# Patient Record
Sex: Female | Born: 1960
Health system: Southern US, Community
[De-identification: ages and names within clinical notes are randomized; demographics above are authoritative.]

## PROBLEM LIST (undated history)

## (undated) DIAGNOSIS — A048 Other specified bacterial intestinal infections: Secondary | ICD-10-CM

## (undated) DIAGNOSIS — F419 Anxiety disorder, unspecified: Secondary | ICD-10-CM

## (undated) DIAGNOSIS — M1990 Unspecified osteoarthritis, unspecified site: Secondary | ICD-10-CM

## (undated) HISTORY — DX: Anxiety disorder, unspecified: F41.9

## (undated) HISTORY — PX: CATARACT EXTRACTION: SUR2

## (undated) HISTORY — DX: Unspecified osteoarthritis, unspecified site: M19.90

## (undated) HISTORY — PX: TUBAL LIGATION: SHX77

## (undated) HISTORY — PX: UPPER GASTROINTESTINAL ENDOSCOPY: SHX188

## (undated) HISTORY — DX: Other specified bacterial intestinal infections: A04.8

## (undated) HISTORY — PX: CERVICAL FUSION: SHX112

---

## 1999-01-23 ENCOUNTER — Ambulatory Visit (HOSPITAL_COMMUNITY): Admission: RE | Admit: 1999-01-23 | Discharge: 1999-01-23 | Payer: Self-pay | Admitting: Gastroenterology

## 1999-11-03 HISTORY — PX: VAGINAL HYSTERECTOMY: SUR661

## 2000-02-16 ENCOUNTER — Ambulatory Visit (HOSPITAL_COMMUNITY): Admission: RE | Admit: 2000-02-16 | Discharge: 2000-02-16 | Payer: Self-pay | Admitting: Family Medicine

## 2000-02-16 ENCOUNTER — Encounter: Payer: Self-pay | Admitting: Family Medicine

## 2000-02-16 ENCOUNTER — Inpatient Hospital Stay (HOSPITAL_COMMUNITY): Admission: AD | Admit: 2000-02-16 | Discharge: 2000-02-16 | Payer: Self-pay | Admitting: *Deleted

## 2000-02-25 ENCOUNTER — Ambulatory Visit (HOSPITAL_COMMUNITY): Admission: RE | Admit: 2000-02-25 | Discharge: 2000-02-25 | Payer: Self-pay | Admitting: *Deleted

## 2000-04-05 ENCOUNTER — Encounter (INDEPENDENT_AMBULATORY_CARE_PROVIDER_SITE_OTHER): Payer: Self-pay

## 2000-04-05 ENCOUNTER — Inpatient Hospital Stay (HOSPITAL_COMMUNITY): Admission: RE | Admit: 2000-04-05 | Discharge: 2000-04-07 | Payer: Self-pay | Admitting: *Deleted

## 2001-09-03 ENCOUNTER — Encounter: Payer: Self-pay | Admitting: Neurological Surgery

## 2001-09-03 ENCOUNTER — Ambulatory Visit (HOSPITAL_COMMUNITY): Admission: RE | Admit: 2001-09-03 | Discharge: 2001-09-03 | Payer: Self-pay | Admitting: Neurological Surgery

## 2001-09-26 ENCOUNTER — Encounter: Payer: Self-pay | Admitting: Neurological Surgery

## 2001-09-26 ENCOUNTER — Ambulatory Visit (HOSPITAL_COMMUNITY): Admission: RE | Admit: 2001-09-26 | Discharge: 2001-09-26 | Payer: Self-pay | Admitting: Neurological Surgery

## 2001-11-18 ENCOUNTER — Encounter: Payer: Self-pay | Admitting: Neurological Surgery

## 2001-11-22 ENCOUNTER — Inpatient Hospital Stay (HOSPITAL_COMMUNITY): Admission: RE | Admit: 2001-11-22 | Discharge: 2001-11-23 | Payer: Self-pay | Admitting: Neurological Surgery

## 2001-11-22 ENCOUNTER — Encounter: Payer: Self-pay | Admitting: Neurological Surgery

## 2001-12-12 ENCOUNTER — Inpatient Hospital Stay (HOSPITAL_COMMUNITY): Admission: EM | Admit: 2001-12-12 | Discharge: 2001-12-19 | Payer: Self-pay | Admitting: Psychiatry

## 2001-12-23 ENCOUNTER — Other Ambulatory Visit (HOSPITAL_COMMUNITY): Admission: RE | Admit: 2001-12-23 | Discharge: 2002-01-02 | Payer: Self-pay | Admitting: *Deleted

## 2002-01-03 ENCOUNTER — Inpatient Hospital Stay (HOSPITAL_COMMUNITY): Admission: EM | Admit: 2002-01-03 | Discharge: 2002-01-06 | Payer: Self-pay | Admitting: Psychiatry

## 2002-01-05 ENCOUNTER — Encounter (HOSPITAL_COMMUNITY): Payer: Self-pay | Admitting: Psychiatry

## 2002-01-05 ENCOUNTER — Ambulatory Visit (HOSPITAL_COMMUNITY): Admission: RE | Admit: 2002-01-05 | Discharge: 2002-01-05 | Payer: Self-pay | Admitting: Psychiatry

## 2002-01-08 ENCOUNTER — Inpatient Hospital Stay (HOSPITAL_COMMUNITY): Admission: EM | Admit: 2002-01-08 | Discharge: 2002-01-11 | Payer: Self-pay | Admitting: Psychiatry

## 2002-01-11 ENCOUNTER — Other Ambulatory Visit (HOSPITAL_COMMUNITY): Admission: RE | Admit: 2002-01-11 | Discharge: 2002-01-19 | Payer: Self-pay | Admitting: *Deleted

## 2002-04-10 ENCOUNTER — Inpatient Hospital Stay (HOSPITAL_COMMUNITY): Admission: EM | Admit: 2002-04-10 | Discharge: 2002-04-11 | Payer: Self-pay | Admitting: Emergency Medicine

## 2002-04-10 ENCOUNTER — Encounter: Payer: Self-pay | Admitting: Emergency Medicine

## 2002-04-11 ENCOUNTER — Encounter: Payer: Self-pay | Admitting: *Deleted

## 2003-12-10 ENCOUNTER — Other Ambulatory Visit: Admission: RE | Admit: 2003-12-10 | Discharge: 2003-12-10 | Payer: Self-pay | Admitting: Internal Medicine

## 2004-01-16 ENCOUNTER — Encounter: Admission: RE | Admit: 2004-01-16 | Discharge: 2004-01-16 | Payer: Self-pay | Admitting: Internal Medicine

## 2004-05-26 ENCOUNTER — Emergency Department (HOSPITAL_COMMUNITY): Admission: EM | Admit: 2004-05-26 | Discharge: 2004-05-26 | Payer: Self-pay | Admitting: Emergency Medicine

## 2004-09-05 ENCOUNTER — Ambulatory Visit: Payer: Self-pay | Admitting: Licensed Clinical Social Worker

## 2004-09-12 ENCOUNTER — Ambulatory Visit: Payer: Self-pay | Admitting: Licensed Clinical Social Worker

## 2004-09-19 ENCOUNTER — Ambulatory Visit: Payer: Self-pay | Admitting: Licensed Clinical Social Worker

## 2004-09-26 ENCOUNTER — Ambulatory Visit: Payer: Self-pay | Admitting: Licensed Clinical Social Worker

## 2004-09-30 ENCOUNTER — Encounter: Admission: RE | Admit: 2004-09-30 | Discharge: 2004-09-30 | Payer: Self-pay | Admitting: Internal Medicine

## 2004-10-08 ENCOUNTER — Ambulatory Visit: Payer: Self-pay | Admitting: Licensed Clinical Social Worker

## 2006-06-26 ENCOUNTER — Emergency Department (HOSPITAL_COMMUNITY): Admission: EM | Admit: 2006-06-26 | Discharge: 2006-06-26 | Payer: Self-pay | Admitting: Family Medicine

## 2006-07-29 ENCOUNTER — Ambulatory Visit: Payer: Self-pay | Admitting: Licensed Clinical Social Worker

## 2006-08-12 ENCOUNTER — Ambulatory Visit: Payer: Self-pay | Admitting: Licensed Clinical Social Worker

## 2006-08-26 ENCOUNTER — Ambulatory Visit: Payer: Self-pay | Admitting: Licensed Clinical Social Worker

## 2006-09-01 ENCOUNTER — Encounter: Admission: RE | Admit: 2006-09-01 | Discharge: 2006-09-01 | Payer: Self-pay | Admitting: Internal Medicine

## 2006-09-09 ENCOUNTER — Ambulatory Visit: Payer: Self-pay | Admitting: Licensed Clinical Social Worker

## 2006-09-30 ENCOUNTER — Other Ambulatory Visit: Admission: RE | Admit: 2006-09-30 | Discharge: 2006-09-30 | Payer: Self-pay | Admitting: Internal Medicine

## 2006-10-07 ENCOUNTER — Ambulatory Visit: Payer: Self-pay | Admitting: Licensed Clinical Social Worker

## 2006-10-21 ENCOUNTER — Ambulatory Visit: Payer: Self-pay | Admitting: Licensed Clinical Social Worker

## 2006-11-18 ENCOUNTER — Ambulatory Visit: Payer: Self-pay | Admitting: Licensed Clinical Social Worker

## 2006-12-23 ENCOUNTER — Ambulatory Visit: Payer: Self-pay | Admitting: Licensed Clinical Social Worker

## 2007-01-13 ENCOUNTER — Ambulatory Visit: Payer: Self-pay | Admitting: Licensed Clinical Social Worker

## 2007-02-02 ENCOUNTER — Emergency Department (HOSPITAL_COMMUNITY): Admission: EM | Admit: 2007-02-02 | Discharge: 2007-02-02 | Payer: Self-pay | Admitting: Family Medicine

## 2007-02-03 ENCOUNTER — Ambulatory Visit: Payer: Self-pay | Admitting: Licensed Clinical Social Worker

## 2007-02-17 ENCOUNTER — Ambulatory Visit: Payer: Self-pay | Admitting: Licensed Clinical Social Worker

## 2007-03-10 ENCOUNTER — Ambulatory Visit: Payer: Self-pay | Admitting: Licensed Clinical Social Worker

## 2007-03-31 ENCOUNTER — Ambulatory Visit: Payer: Self-pay | Admitting: Licensed Clinical Social Worker

## 2007-04-28 ENCOUNTER — Ambulatory Visit: Payer: Self-pay | Admitting: Licensed Clinical Social Worker

## 2007-05-19 ENCOUNTER — Ambulatory Visit: Payer: Self-pay | Admitting: Licensed Clinical Social Worker

## 2007-06-09 ENCOUNTER — Ambulatory Visit: Payer: Self-pay | Admitting: Licensed Clinical Social Worker

## 2007-06-30 ENCOUNTER — Ambulatory Visit: Payer: Self-pay | Admitting: Licensed Clinical Social Worker

## 2007-07-28 ENCOUNTER — Ambulatory Visit: Payer: Self-pay | Admitting: Licensed Clinical Social Worker

## 2007-11-17 ENCOUNTER — Ambulatory Visit: Payer: Self-pay | Admitting: Licensed Clinical Social Worker

## 2007-12-15 ENCOUNTER — Ambulatory Visit: Payer: Self-pay | Admitting: Licensed Clinical Social Worker

## 2008-01-05 ENCOUNTER — Ambulatory Visit: Payer: Self-pay | Admitting: Licensed Clinical Social Worker

## 2008-01-26 ENCOUNTER — Ambulatory Visit: Payer: Self-pay | Admitting: Licensed Clinical Social Worker

## 2008-02-23 ENCOUNTER — Ambulatory Visit: Payer: Self-pay | Admitting: Licensed Clinical Social Worker

## 2008-04-19 ENCOUNTER — Ambulatory Visit: Payer: Self-pay | Admitting: Licensed Clinical Social Worker

## 2008-07-10 ENCOUNTER — Ambulatory Visit: Payer: Self-pay | Admitting: Licensed Clinical Social Worker

## 2008-07-24 ENCOUNTER — Ambulatory Visit: Payer: Self-pay | Admitting: Licensed Clinical Social Worker

## 2008-08-23 ENCOUNTER — Ambulatory Visit: Payer: Self-pay | Admitting: Licensed Clinical Social Worker

## 2008-09-11 ENCOUNTER — Ambulatory Visit: Payer: Self-pay | Admitting: Internal Medicine

## 2008-10-19 ENCOUNTER — Ambulatory Visit: Payer: Self-pay | Admitting: Licensed Clinical Social Worker

## 2008-11-12 ENCOUNTER — Ambulatory Visit: Payer: Self-pay | Admitting: Internal Medicine

## 2008-11-20 ENCOUNTER — Ambulatory Visit: Payer: Self-pay | Admitting: Internal Medicine

## 2008-12-03 ENCOUNTER — Ambulatory Visit: Payer: Self-pay | Admitting: Licensed Clinical Social Worker

## 2008-12-13 ENCOUNTER — Ambulatory Visit: Payer: Self-pay | Admitting: Licensed Clinical Social Worker

## 2009-01-10 ENCOUNTER — Ambulatory Visit: Payer: Self-pay | Admitting: Licensed Clinical Social Worker

## 2009-02-07 ENCOUNTER — Ambulatory Visit: Payer: Self-pay | Admitting: Licensed Clinical Social Worker

## 2009-02-26 ENCOUNTER — Ambulatory Visit: Payer: Self-pay | Admitting: Internal Medicine

## 2009-03-07 ENCOUNTER — Ambulatory Visit: Payer: Self-pay | Admitting: Licensed Clinical Social Worker

## 2009-04-18 ENCOUNTER — Ambulatory Visit: Payer: Self-pay | Admitting: Licensed Clinical Social Worker

## 2009-06-13 ENCOUNTER — Ambulatory Visit: Payer: Self-pay | Admitting: Licensed Clinical Social Worker

## 2009-07-22 ENCOUNTER — Emergency Department (HOSPITAL_COMMUNITY): Admission: EM | Admit: 2009-07-22 | Discharge: 2009-07-22 | Payer: Self-pay | Admitting: Emergency Medicine

## 2009-07-23 ENCOUNTER — Ambulatory Visit: Payer: Self-pay | Admitting: Internal Medicine

## 2009-07-25 ENCOUNTER — Ambulatory Visit: Payer: Self-pay | Admitting: Licensed Clinical Social Worker

## 2009-08-09 ENCOUNTER — Ambulatory Visit: Payer: Self-pay | Admitting: Internal Medicine

## 2009-09-05 ENCOUNTER — Ambulatory Visit: Payer: Self-pay | Admitting: Licensed Clinical Social Worker

## 2009-10-01 ENCOUNTER — Ambulatory Visit: Payer: Self-pay | Admitting: Internal Medicine

## 2010-02-25 ENCOUNTER — Emergency Department (HOSPITAL_COMMUNITY): Admission: EM | Admit: 2010-02-25 | Discharge: 2010-02-25 | Payer: Self-pay | Admitting: Family Medicine

## 2010-03-07 ENCOUNTER — Ambulatory Visit: Payer: Self-pay | Admitting: Internal Medicine

## 2010-03-07 ENCOUNTER — Encounter (INDEPENDENT_AMBULATORY_CARE_PROVIDER_SITE_OTHER): Payer: Self-pay | Admitting: *Deleted

## 2010-03-07 ENCOUNTER — Encounter: Admission: RE | Admit: 2010-03-07 | Discharge: 2010-03-07 | Payer: Self-pay | Admitting: Internal Medicine

## 2010-03-18 ENCOUNTER — Encounter: Payer: Self-pay | Admitting: Gastroenterology

## 2010-04-02 ENCOUNTER — Encounter: Payer: Self-pay | Admitting: Gastroenterology

## 2010-04-08 ENCOUNTER — Other Ambulatory Visit: Admission: RE | Admit: 2010-04-08 | Discharge: 2010-04-08 | Payer: Self-pay | Admitting: Gynecology

## 2010-04-08 ENCOUNTER — Ambulatory Visit: Payer: Self-pay | Admitting: Gynecology

## 2010-04-16 ENCOUNTER — Ambulatory Visit: Payer: Self-pay | Admitting: Gynecology

## 2010-05-01 ENCOUNTER — Telehealth (INDEPENDENT_AMBULATORY_CARE_PROVIDER_SITE_OTHER): Payer: Self-pay | Admitting: *Deleted

## 2010-05-07 ENCOUNTER — Telehealth (INDEPENDENT_AMBULATORY_CARE_PROVIDER_SITE_OTHER): Payer: Self-pay | Admitting: *Deleted

## 2010-06-13 ENCOUNTER — Ambulatory Visit: Payer: Self-pay | Admitting: Gastroenterology

## 2010-06-13 ENCOUNTER — Encounter (INDEPENDENT_AMBULATORY_CARE_PROVIDER_SITE_OTHER): Payer: Self-pay | Admitting: *Deleted

## 2010-06-13 DIAGNOSIS — R1084 Generalized abdominal pain: Secondary | ICD-10-CM | POA: Insufficient documentation

## 2010-06-13 LAB — CONVERTED CEMR LAB
AST: 15 units/L (ref 0–37)
BUN: 13 mg/dL (ref 6–23)
Basophils Relative: 0.4 % (ref 0.0–3.0)
Calcium: 9.3 mg/dL (ref 8.4–10.5)
Chloride: 113 meq/L — ABNORMAL HIGH (ref 96–112)
Creatinine, Ser: 0.7 mg/dL (ref 0.4–1.2)
Eosinophils Absolute: 0.1 10*3/uL (ref 0.0–0.7)
Eosinophils Relative: 1.7 % (ref 0.0–5.0)
GFR calc non Af Amer: 96.35 mL/min (ref >60)
Glucose, Bld: 80 mg/dL (ref 70–99)
Hemoglobin: 12.5 g/dL (ref 12.0–15.0)
IgA: 305 mg/dL (ref 68–378)
Lymphocytes Relative: 43.6 % (ref 12.0–46.0)
MCHC: 34.9 g/dL (ref 30.0–36.0)
MCV: 91.9 fL (ref 78.0–100.0)
Neutro Abs: 2.4 10*3/uL (ref 1.4–7.7)
RBC: 3.91 M/uL (ref 3.87–5.11)
Tissue Transglutaminase Ab, IgA: 6.6 units (ref <20)

## 2010-06-23 ENCOUNTER — Telehealth: Payer: Self-pay | Admitting: Gastroenterology

## 2010-06-24 ENCOUNTER — Telehealth: Payer: Self-pay | Admitting: Gastroenterology

## 2010-06-27 ENCOUNTER — Ambulatory Visit: Payer: Self-pay | Admitting: Gynecology

## 2010-07-03 DIAGNOSIS — A048 Other specified bacterial intestinal infections: Secondary | ICD-10-CM

## 2010-07-03 HISTORY — DX: Other specified bacterial intestinal infections: A04.8

## 2010-07-08 ENCOUNTER — Ambulatory Visit: Payer: Self-pay | Admitting: Licensed Clinical Social Worker

## 2010-07-08 ENCOUNTER — Ambulatory Visit: Payer: Self-pay | Admitting: Gastroenterology

## 2010-07-18 ENCOUNTER — Telehealth: Payer: Self-pay | Admitting: Gastroenterology

## 2010-08-05 ENCOUNTER — Ambulatory Visit: Payer: Self-pay | Admitting: Licensed Clinical Social Worker

## 2010-09-01 ENCOUNTER — Encounter: Admission: RE | Admit: 2010-09-01 | Discharge: 2010-09-01 | Payer: Self-pay | Admitting: Internal Medicine

## 2010-09-02 ENCOUNTER — Telehealth: Payer: Self-pay | Admitting: Gastroenterology

## 2010-09-02 ENCOUNTER — Ambulatory Visit: Payer: Self-pay | Admitting: Licensed Clinical Social Worker

## 2010-09-04 ENCOUNTER — Telehealth: Payer: Self-pay | Admitting: Gastroenterology

## 2010-09-04 ENCOUNTER — Ambulatory Visit: Payer: Self-pay | Admitting: Internal Medicine

## 2010-09-04 DIAGNOSIS — A09 Infectious gastroenteritis and colitis, unspecified: Secondary | ICD-10-CM | POA: Insufficient documentation

## 2010-09-04 DIAGNOSIS — R1012 Left upper quadrant pain: Secondary | ICD-10-CM | POA: Insufficient documentation

## 2010-09-04 DIAGNOSIS — A048 Other specified bacterial intestinal infections: Secondary | ICD-10-CM | POA: Insufficient documentation

## 2010-09-04 DIAGNOSIS — R1032 Left lower quadrant pain: Secondary | ICD-10-CM | POA: Insufficient documentation

## 2010-09-11 ENCOUNTER — Ambulatory Visit (HOSPITAL_COMMUNITY): Admission: RE | Admit: 2010-09-11 | Discharge: 2010-09-11 | Payer: Self-pay | Admitting: Internal Medicine

## 2010-09-15 ENCOUNTER — Telehealth: Payer: Self-pay | Admitting: Internal Medicine

## 2010-09-30 ENCOUNTER — Ambulatory Visit: Payer: Self-pay | Admitting: Internal Medicine

## 2010-10-09 ENCOUNTER — Ambulatory Visit: Payer: Self-pay | Admitting: Internal Medicine

## 2010-10-09 ENCOUNTER — Ambulatory Visit: Payer: Self-pay | Admitting: Licensed Clinical Social Worker

## 2010-10-17 ENCOUNTER — Ambulatory Visit: Payer: Self-pay | Admitting: Cardiology

## 2010-10-23 ENCOUNTER — Ambulatory Visit: Payer: Self-pay | Admitting: Licensed Clinical Social Worker

## 2010-11-20 ENCOUNTER — Ambulatory Visit
Admission: RE | Admit: 2010-11-20 | Discharge: 2010-11-20 | Payer: Self-pay | Source: Home / Self Care | Attending: Licensed Clinical Social Worker | Admitting: Licensed Clinical Social Worker

## 2010-11-22 ENCOUNTER — Encounter: Payer: Self-pay | Admitting: Internal Medicine

## 2010-11-25 ENCOUNTER — Ambulatory Visit
Admission: RE | Admit: 2010-11-25 | Discharge: 2010-11-25 | Payer: Self-pay | Source: Home / Self Care | Attending: Internal Medicine | Admitting: Internal Medicine

## 2010-12-02 NOTE — Progress Notes (Signed)
Summary: Triage  Phone Note Call from Patient Call back at Home Phone [phone]   Caller: Patient Call For: Dr. [name] Reason for Call: Talk to Nurse Summary of Call: Since taking the Pylera she doesn't have an appetite with abd pain Initial call taken by: Karna Christmas,  July 18, 2010 3:41 PM  Follow-up for Phone Call        Pt states that since she was seen in the office she has developed right side abdominal pain and loss of appetite.  The pain is no worse.  She was put on Pylera and feels bloated.  I advised her to watch it over the weekend that it sometimes takes several weeks for the symptoms of H pylori to subside  and if her pain worsens to go to the ER and call  on Monday if no better.  Pt agreed Follow-up by: Chales Abrahams CMA Duncan Dull),  July 18, 2010 4:21 PM  Additional Follow-up for Phone Call Additional follow up Details #1::        i agree Additional Follow-up by: [name] Fee MD,  July 21, 2010 8:19 AM

## 2010-12-02 NOTE — Letter (Signed)
Summary: EGD Instructions  [facility]  [address] [place], [place] [address]   Phone: [phone]  Fax: [phone]       KHIA DIETERICH    1961-07-22    MRN: [id number]       Procedure Day /Date:07/14/10 MON     Arrival Time: 3 pm     Procedure Time:4 pm     Location of Procedure:                    [facility] (4th Floor)    PREPARATION FOR ENDOSCOPY   On 07/14/10  THE DAY OF THE PROCEDURE:  1.   No solid foods, milk or milk products are allowed after midnight the night before your procedure.  2.   Do not drink anything colored red or purple.  Avoid juices with pulp.  No orange juice.   3.  You may drink clear liquids until 2 pm, which is 2 hours before your procedure.                                                                                                CLEAR LIQUIDS INCLUDE: Water Jello Ice Popsicles Tea (sugar ok, no milk/cream) Powdered fruit flavored drinks Coffee (sugar ok, no milk/cream) Gatorade Juice: apple, white grape, white cranberry  Lemonade Clear bullion, consomm, broth Carbonated beverages (any kind) Strained chicken noodle soup Hard Candy   MEDICATION INSTRUCTIONS  Unless otherwise instructed, you should take regular prescription medications with a small sip of water as early as possible the morning of your procedure.              OTHER INSTRUCTIONS  You will need a responsible adult at least 50 years of age to accompany you and drive you home.   This person must remain in the waiting room during your procedure.  Wear loose fitting clothing that is easily removed.  Leave jewelry and other valuables at home.  However, you may wish to bring a book to read or an iPod/MP3 player to listen to music as you wait for your procedure to start.  Remove all body piercing jewelry and leave at home.  Total time from sign-in until discharge is approximately 2-3 hours.  You should go home directly  after your procedure and rest.  You can resume normal activities the day after your procedure.  The day of your procedure you should not:   Drive   Make legal decisions   Operate machinery   Drink alcohol   Return to work  You will receive specific instructions about eating, activities and medications before you leave.    The above instructions have been reviewed and explained to me by   _______________________    I fully understand and can verbalize these instructions _____________________________ Date _________

## 2010-12-02 NOTE — Progress Notes (Signed)
Summary: Speak to nurse  Phone Note Call from Patient   Caller: [phone] Onalee Hua -spouse Call For: DR [name] Reason for Call: Talk to Nurse Summary of Call: Wants to discuss pain problem with nurse Initial call taken by: Leanor Kail [facility],  June 24, 2010 11:29 AM  Follow-up for Phone Call        I advised the spouse that I am unable to discuss the pt with him.  The pt himself will need to call Follow-up by: Chales Abrahams CMA Duncan Dull),  June 24, 2010 11:38 AM

## 2010-12-02 NOTE — Assessment & Plan Note (Signed)
History of Present Illness Visit Type: new patient  Primary GI MD: Rob Bunting MD Primary Provider: [name], MD  Requesting Provider: na Chief Complaint: Generalized abd pain, ? ulcers   History of Present Illness:     [name] pleasant 50 year old [name] who was sent by [facility].    4 months ago she had sharp left flank pain that radiated to whole stomach and even back.  went to urgent care. Urinalysis was normal. CT eventually done, and it was normal.  Went to see Dr. Loreta Ave, found Heme + stool.  Eventual colonoscopy to [facility] and it was normal.  Told to have repeat EGD  Was positive for H. pylori serologically, was put on prev-pac.  This was 2 months ago.  A pelvic ultrasound was normal (by gynecologist).  She has been coughing up material that is black.  She has to sleep with a pillow or she hurts on left side or right side. Somedays aren't as bad as others.    Her appetite has changed.  She has a lot of nausea.  Bloating, lack of appetite.  Has lost about 5 pounds.  [name] poor appetite, when she forces herself to eat  it hurt. She has no pyrosis at all.  NO dysphagia at all.  GEneralized abd pain that is intermittent.  Worse with certain positions, can occur after eating.  NO NSAIDs at all.            Current Medications (verified): 1)  Xanax 0.5 Mg Tabs (Alprazolam) .... One Tablet By Mouth Three Times A Day 2)  Topamax 200 Mg Tabs (Topiramate) .Marland Kitchen.. 1 and 1/2 By Mouth Once Daily 3)  Multivitamins   Tabs (Multiple Vitamin) .... One Tablet By Mouth Once Daily 4)  Lamictal 100 Mg Tabs (Lamotrigine) .... One Tablet By Mouth Once Daily 5)  Atenolol 50 Mg Tabs (Atenolol) .... One Tablet By Mouth Once Daily 6)  Ambien 10 Mg Tabs (Zolpidem Tartrate) .... One Tablet By Mouth Once Daily At Bedtime  Allergies (verified): No Known Drug Allergies  Past History:  Past Medical History: Chronic Headaches Urinary Tract Infection Depression BIPOLAR DISORDER Anxiety Disorder     Past Surgical History: Hysterectomy PARTIAL CERVICAL LAMINECTOMY   Family History: no colon cancer  Social History: she is divorced, she has 3 children, she works as a Advertising copywriter, she does not smoke cigarettes, she does not drink alcohol or caffeine  Review of Systems       Pertinent positive and negative review of systems were noted in the above HPI and GI specific review of systems.  All other review of systems was otherwise negative.   Vital Signs:  Patient profile:   50 year old female Height:      60 inches Weight:      121 pounds BMI:     23.72 BSA:     1.51 Pulse rate:   60 / minute Pulse rhythm:   regular BP sitting:   110 / 64  (left arm) Cuff size:   regular  Vitals Entered By: Ok Anis CMA (June 13, 2010 9:52 AM)  Physical Exam  Additional Exam:  Constitutional: generally well appearing Psychiatric: alert and oriented times 3 Eyes: extraocular movements intact Mouth: oropharynx moist, no lesions Neck: supple, no lymphadenopathy Cardiovascular: heart regular rate and rythm Lungs: CTA bilaterally Abdomen: soft, non-tender, non-distended, no obvious ascites, no peritoneal signs, normal bowel sounds Extremities: no lower extremity edema bilaterally Skin: no lesions on visible extremities    Impression &  Recommendations:  Problem # 1:  Intermittent abdominal pains her pains are positional, sometimes related to eating. A recent colonoscopy by another gastroenterologist was completely normal to the terminal ileum. CT Scan recently was completely normal. She will get a repeat set of labs including a CBC, complete metabolic profile, celiac sprue testing. We'll proceed with EGD at her soonest convenience check her for significant gastritis, peptic ulcer disease.   Other Orders: TLB-CBC Platelet - w/Differential (85025-CBCD) TLB-CMP (Comprehensive Metabolic Pnl) (80053-COMP) T-Tissue Transglutamase Ab IgA (40981-19147) TLB-IgA (Immunoglobulin A)  (82784-IGA)  Patient Instructions: 1)  You will be scheduled to have an upper endoscopy. 2)  You will get lab test(s) done today (cbc, cmet, tTG, total IgA). 3)  A copy of this information will be sent to Dr. Katherina Right, Lenord Fellers. 4)  The medication list was reviewed and reconciled.  All changed / newly prescribed medications were explained.  A complete medication list was provided to the patient / caregiver.  Appended Document: Orders Update/EGD    Clinical Lists Changes  Orders: Added new Test order of EGD (EGD) - Signed

## 2010-12-02 NOTE — Progress Notes (Signed)
Summary: results request  Phone Note Call from Patient Call back at Home Phone [phone]   Caller: Patient Call For: Dr. Juanda Chance Reason for Call: Talk to Nurse Summary of Call: would like test results Initial call taken by: Vallarie Mare,  September 15, 2010 1:08 PM  Follow-up for Phone Call        Patient has been advised that her u/s and rib films were normal. She continues to have left sided abdominal pain as she was when seen in office. She would like to know your suggestions for the next step in your care..... Follow-up by: Lamona Curl CMA Duncan Dull),  September 15, 2010 1:13 PM  Additional Follow-up for Phone Call Additional follow up Details #1::        SHE NEEDS TO SEE DR. BAXLEY,HER PAIN IS MUSCULOSKELETAL-NOT GI IN NATURE. Additional Follow-up by: Peterson Ao,  September 16, 2010 4:35 PM     Appended Document: results request Patient advised to see Dr Lenord Fellers for evaluation of pain as it is more likely musculoskeletal related than GI related. Patient verbalizes understanding.

## 2010-12-02 NOTE — Progress Notes (Signed)
Summary: Triage  Phone Note Call from Patient Call back at Home Phone [phone]   Caller: Patient Call For: Dr. Gerilyn Pilgrim Reason for Call: Talk to Nurse Summary of Call: Still having left side abd pain under ribs and radiating to the back....requesting to see P.A. Initial call taken by: Karna Christmas,  September 04, 2010 12:18 PM  Follow-up for Phone Call        pt continues to have left side abd pain and wishes to see PA today, Dr [name] ok'd visit per last triage note.  She was put on Amy's schedule for 2 pm Follow-up by: Chales Abrahams CMA Duncan Dull),  September 04, 2010 12:27 PM

## 2010-12-02 NOTE — Progress Notes (Signed)
Summary: RECORDS REVIEW  Phone Note Other Incoming   Request: Send information Summary of Call: [facility] medical records release form received from the patient requesting records from Dr. Kenna Gilbert office. Release faxed 04/29/2010 to [phone].      Appended Document:  Records received from Dr. Kenna Gilbert office today. 5 pages forwarded to Dr. [name] for review.

## 2010-12-02 NOTE — Letter (Signed)
Summary: [facility]  [facility]   Imported By: Sherian Rein 06/26/2010 13:53:14  _____________________________________________________________________  External Attachment:    Type:   Image     Comment:   External Document

## 2010-12-02 NOTE — Progress Notes (Signed)
Summary: Triage  Phone Note Call from Patient Call back at Home Phone [phone]   Caller: Patient Call For: Dr. [name] Reason for Call: Talk to Nurse Summary of Call: pt. has ENDO sch'd for 07-08-10 and having a flareup. Abd pain, nauseated, vomiting. Prescribed Vicodin from her PCP and it is not helping. Wants to know if something can be prescribed? Initial call taken by: Karna Christmas,  June 23, 2010 2:18 PM  Follow-up for Phone Call        pt having left side abd pain, bloating, no constipation, diarrhea.  No fever.  No rectal bleeding.  Feels like"contractions".  Has to lay on her side with a pillow. . Pt had a feeling of fainting this morning   Pt wants to have something for the pain. 07/08/10 is having EGD. Follow-up by: Chales Abrahams CMA Duncan Dull),  June 23, 2010 3:02 PM  Additional Follow-up for Phone Call Additional follow up Details #1::        I would not prescribe narcotic pain meds at this point, she can try tramadol 50mg  pills, disp 60, she can take one pill three times a day as needed for pains.  Her CT scan was normal, labs all normal.   Additional Follow-up by: [name] Fee MD,  June 23, 2010 3:08 PM    Additional Follow-up for Phone Call Additional follow up Details #2::    rx sent pt aware Follow-up by: Chales Abrahams CMA Duncan Dull),  June 23, 2010 3:12 PM  New/Updated Medications: TRAMADOL HCL 50 MG TABS (TRAMADOL HCL) 1 by mouth three times a day Prescriptions: TRAMADOL HCL 50 MG TABS (TRAMADOL HCL) 1 by mouth three times a day  #60 x 0   Entered by:   Chales Abrahams CMA (AAMA)   Authorized by:   [name] Fee MD   Signed by:   Chales Abrahams CMA (AAMA) on 06/23/2010   Method used:   Electronically to        [facility]  [phone]* (retail)       [address] [place], [place]  [address]       Ph: [phone] or [phone]       Fax: [phone]   RxID:   [id number]

## 2010-12-02 NOTE — Progress Notes (Signed)
Summary: [name] [name] abd pain & back pain  Phone Note Call from Patient Call back at Home Phone [phone]   Call For: Dr [name] Reason for Call: Talk to Nurse Summary of Call: Doesnt think she can hold out until her appoinment on 09-19-10. All her symptoms are back. Sharp abd pain & back pain. Initial call taken by: Leanor Kail [facility],  September 02, 2010 2:28 PM  Follow-up for Phone Call        pt having left side abd pain under ribs and radiating to the back.  Pylera was finished and the pain got better, appetite came back.  Then 2 weeks later the pain returned and she lost her appetite.  The pain is the same as the pain before the Pylera.  Saturday the pain returned worse and she felt like she may need to go to the ER.  She had nausea and vomiting and had to go to bed.  Lying down is the only thing that makes it some what better.  Pt was in tears over the phone.  Pt thinks she may need additional antibiotics and something for nausea.  Please advise Follow-up by: Chales Abrahams CMA Duncan Dull),  September 02, 2010 2:52 PM  Additional Follow-up for Phone Call Additional follow up Details #1::        would not retrieved with antibiotics however Phenergan is reasonable. Please prescribed 25 mg pills, take one to 2 pills every 5-6 hours as needed for nausea. Dispense 50 with one refill Additional Follow-up by: [name] Fee MD,  September 02, 2010 4:16 PM    Additional Follow-up for Phone Call Additional follow up Details #2::    pt has phenergan at home for migraines and would still like to be evaluated before the end of the year because of her deductible.  Can she be scheduled for a extender appt or should she see her PCP?   She thinks someone told her the pain could be her spleen. Chales Abrahams CMA Duncan Dull)  September 02, 2010 4:31 PM   Additional Follow-up for Phone Call Additional follow up Details #3:: Details for Additional Follow-up Action Taken: 11/18 is before the end of the year, should be OK for  her deductable issue.  If she is unable to wait because of symptoms, then a PA spot is OK or any opening with me.   Additional Follow-up by: [name] Fee MD,  September 02, 2010 7:06 PM  The pt wishes to keep her 09/19/10 appt with Dr [name], she will call back if she has any worse pain or symptoms before then. Chales Abrahams CMA Duncan Dull)  September 03, 2010 8:46 AM

## 2010-12-02 NOTE — Procedures (Signed)
Summary: Colonoscopy/[facility]  Colonoscopy/[facility]   Imported By: Sherian Rein 06/26/2010 13:54:17  _____________________________________________________________________  External Attachment:    Type:   Image     Comment:   External Document

## 2010-12-02 NOTE — Progress Notes (Signed)
Summary: Schedule New appt  Phone Note Outgoing Call Call back at [facility] Phone [phone]   Call placed by: Chales Abrahams CMA Duncan Dull),  May 07, 2010 1:28 PM Summary of Call: called to offer pt a New pt appt Initial call taken by: Chales Abrahams CMA Duncan Dull),  May 07, 2010 1:29 PM  Follow-up for Phone Call        pt called and appt was made Follow-up by: Chales Abrahams CMA Duncan Dull),  May 07, 2010 1:59 PM

## 2010-12-02 NOTE — Procedures (Signed)
Summary: Upper Endoscopy  Patient: [name] [name] Note: All result statuses are Final unless otherwise noted.  Tests: (1) Upper Endoscopy (EGD)   EGD Upper Endoscopy       DONE     [facility]     [address]     [place], [place]  [address]           ENDOSCOPY PROCEDURE REPORT           PATIENT:  [name], [name]  MR#:  [id number]     BIRTHDATE:  Jul 30, 1961, 49 yrs. old  GENDER:  female     ENDOSCOPIST:  [name] Fee, MD     Referred by:  Sharlet Salina, M.D.     PROCEDURE DATE:  07/08/2010     PROCEDURE:  EGD with biopsy     ASA CLASS:  Class II     INDICATIONS:  LUQ pains     MEDICATIONS:  Fentanyl 50 mcg IV, Versed 4 mg IV     TOPICAL ANESTHETIC:  Exactacain Spray           DESCRIPTION OF PROCEDURE:   After the risks benefits and     alternatives of the procedure were thoroughly explained, informed     consent was obtained.  The LB [id number]-[id number] endoscope was     introduced through the mouth and advanced to the second portion of     the duodenum, without limitations.  The instrument was slowly     withdrawn as the mucosa was fully examined.     <<PROCEDUREIMAGES>>     There was moderate appearing, atrophic pan-gastritis. Biopsies     were taken to check for H. pylori and sent to pathology (jar 1)     (see image7 and image8).  Otherwise the examination was normal     (see image1, image2, image3, and image4).    Retroflexed views     revealed no abnormalities.    The scope was then withdrawn from     the patient and the procedure completed.     COMPLICATIONS:  None           ENDOSCOPIC IMPRESSION:     1) Moderate gastritis, biopsied to check for H. pylori     2) Otherwise normal examination           RECOMMENDATIONS:     If biopsies show H. pylori, she will be started on the     appropriate antibiotics.  If not, she will be referred back to     PCP. The predominantly left upper flank pains may not be GI     related given workup to date  (EGD, CT scan, colonoscopy by Dr.     Loreta Ave, labs).     ______________________________     [name] Fee, MD           cc: Dr. Audie Box           n.     eSIGNED:   [name] Fee at 07/08/2010 11:10 AM           Aldean Baker, [id number]  Note: An exclamation mark (!) indicates a result that was not dispersed into the flowsheet. Document Creation Date: 07/08/2010 11:10 AM _______________________________________________________________________  (1) Order result status: Final Collection or observation date-time: 07/08/2010 10:59 Requested date-time:  Receipt date-time:  Reported date-time:  Referring Physician:   Ordering Physician: Rob Bunting [phone]) Specimen Source:  Source: Launa Grill Order Number: [id number] Lab site:

## 2010-12-02 NOTE — Assessment & Plan Note (Signed)
Summary: left side abd pain/pl                        [name] [name]   History of Present Illness Visit Type: Follow-up Visit Primary GI MD: Rob Bunting MD Primary Provider: [name], MD  Requesting Provider: na Chief Complaint: LUQ pain that radiates to back and chest History of Present Illness:   50 YO FEMALE KNOW RECENTLY TO DR. Christella [name] WHO WAS SEEN IN AUGUST  WITH C/O LUQ PAIN. SHE UNDERWENT EGD  WHICH SHOWED GASTRITIS/+ FOR H.PYLORI ON BX. SHE WAS GIVEN A COURSE OF PYLERA /OMEPRAZOLE X 10 DAYS  SHE SAYS SHE FELT BAD WHILE ON THE ABX WITH NAUSEA,ALTERED TASTE ETC. ABOUT 2 WEEKS AFTER FINISHING THE ABX SHE STARTED TO FEEL BETTER,IMPROVED APPETITE ETC. ABOUT 2 WEEKS AGO SHE STARTED NOTICING LUQ PAIN BACK AGAIN,INTERMITTENT. THIS PAST WEEKEND THE PAIN BECAME INTENSE AND "BALLED HER UP". SHE DESCRIBES A CONSATNT SQEEZING,PRESSURE TYPE PAIN IN THE LUQ,LOWER LEFT RIBS AND AROUND INTO LEFT BACK. SHE IS SLEEPING WITH PILLOWS PROPPING UP HER BACK, AND HURTS WORSE WITH MOVEMENT,STRETCHING ETC. NO FEVER,APPETITE FAIR, NO CHANGES IN BOWELS ETC. SHE ADAMANTLY DENIES ANY INJURY ETC.  SHE DOES HAVE HX OF LUMBAR AND CERVICAL DISC DISEASE.SHE IS WORRIED ABOUT HER SPLEEN.   GI Review of Systems    Reports abdominal pain, chest pain, loss of appetite, and  nausea.     Location of  Abdominal pain: LUQ.    Denies acid reflux, belching, bloating, dysphagia with liquids, dysphagia with solids, heartburn, vomiting, vomiting blood, weight loss, and  weight gain.      Reports change in bowel habits.     Denies anal fissure, black tarry stools, constipation, diarrhea, diverticulosis, fecal incontinence, heme positive stool, hemorrhoids, irritable bowel syndrome, jaundice, light color stool, liver problems, rectal bleeding, and  rectal pain.    Current Medications (verified): 1)  Xanax 0.5 Mg Tabs (Alprazolam) .... One Tablet By Mouth Three Times A Day 2)  Topamax 200 Mg Tabs (Topiramate) .Marland Kitchen.. 1 and 1/2 By Mouth  Once Daily 3)  Multivitamins   Tabs (Multiple Vitamin) .... One Tablet By Mouth Once Daily 4)  Lamictal 100 Mg Tabs (Lamotrigine) .... One Tablet By Mouth Once Daily 5)  Atenolol 50 Mg Tabs (Atenolol) .... One Tablet By Mouth Once Daily 6)  Ambien 10 Mg Tabs (Zolpidem Tartrate) .... One Tablet By Mouth Once Daily At Bedtime 7)  Vivelle-Dot 0.1 Mg/24hr Pttw (Estradiol) .... As Directed  Allergies (verified): No Known Drug Allergies  Past History:  Past Medical History: Chronic Headaches/MIGRAINES Urinary Tract Infection Depression BIPOLAR DISORDER Anxiety Disorder    Past Surgical History: Reviewed history from 06/13/2010 and no changes required. Hysterectomy PARTIAL CERVICAL LAMINECTOMY   Family History: Reviewed history from 06/13/2010 and no changes required. no colon cancer  Social History: Reviewed history from 06/13/2010 and no changes required. she is divorced, she has 3 children, she works as a Advertising copywriter, she does not smoke cigarettes, she does not drink alcohol or caffeine  Review of Systems       The patient complains of fatigue and headaches-new.  The patient denies allergy/sinus, anemia, anxiety-new, arthritis/joint pain, back pain, blood in urine, breast changes/lumps, change in vision, confusion, cough, coughing up blood, depression-new, fainting, fever, hearing problems, heart murmur, heart rhythm changes, itching, menstrual pain, muscle pains/cramps, night sweats, nosebleeds, pregnancy symptoms, shortness of breath, skin rash, sleeping problems, sore throat, swelling of feet/legs, swollen lymph glands, thirst -  excessive , urination - excessive , urination changes/pain, urine leakage, vision changes, and voice change.         SEE HPI  Vital Signs:  Patient profile:   50 year old female Height:      60 inches Weight:      123.50 pounds BMI:     24.21 Pulse rate:   68 / minute Pulse rhythm:   regular BP sitting:   98 / 66  (left arm) Cuff size:    regular  Vitals Entered By: June McMurray CMA Duncan Dull) (September 04, 2010 2:10 PM)  Physical Exam  General:  Well developed, well nourished, no acute distress. Head:  Normocephalic and atraumatic. Eyes:  PERRLA, no icterus. Lungs:  Clear throughout to auscultation. Heart:  Regular rate and rhythm; no murmurs, rubs,  or bruits. Abdomen:  SOFT, NO MASS OR HSM, BS+,TENDER LUQ AND ALONG LEFT COSTAL MARGIN, AND ALONG LEFT LOWER RIBS IN TO LEFT BACK. Rectal:  NOT DONE Extremities:  No clubbing, cyanosis, edema or deformities noted. Neurologic:  Alert and  oriented x4;  grossly normal neurologically. Psych:  Alert and cooperative. Normal mood and affect.   Impression & Recommendations:  Problem # 1:  ABDOMINAL PAIN-LUQ (ICD-789.02) Assessment Deteriorated 50 YO FEMALE WITH   RECURRENT LUQ PAIN WHICH BY EXAM IS MUSCULOSKELETAL . R/O COSTOCHONDRITIS.  UPPER ABDOMINAL US LEFT LOWER RIB DETAILS TRIAL OF NORFLEX 100 MG TWICE DAILY X 10 DAYS TRIAL OF NAPROXEN 500 MG TWICE DAILY X 10 DAYS  IF SXS PERSIST [name] ADVISED TO FOLLOW UP WITH DR. BAXLEY , AS SXS  ARE NOT GI IN NATURE.  Problem # 2:  HELICOBACTER PYLORI INFECTION (ICD-041.86) Assessment: Comment Only TREATED WITH PYLERA 9/11  Other Orders: Ultrasound Abdomen (UAS)  Patient Instructions: 1)  We scheduled the  the Ultrasound for 09-11-10, Thursday, 2)  Directions and brochure provided. 3)  We sent the Norflex and Naproxen prescriptions to  [facility]. 4)  Copy sent to : Candis Shine, Md 5)  The medication list was reviewed and reconciled.  All changed / newly prescribed medications were explained.  A complete medication list was provided to the patient / caregiver. Prescriptions: NAPROXEN SODIUM 550 MG TABS (NAPROXEN SODIUM) take 1 tab every 12 hours as needed for pain x 10 days  #20 x 0   Entered by:   Lowry Ram NCMA   Authorized by:   Sammuel Cooper PA-c   Signed by:   Lowry Ram NCMA on 09/04/2010   Method used:    Electronically to        [phone]* (retail)       [address] [place], [place]  [address]       Ph: [phone] or [phone]       Fax: [phone]   RxID:   [id number] ORPHENADRINE CITRATE CR 100 MG XR12H-TAB (ORPHENADRINE CITRATE) Take 1 tab twice daily x 10 days  #20 x 0   Entered by:   Lowry Ram NCMA   Authorized by:   Sammuel Cooper PA-c   Signed by:   Lowry Ram NCMA on 09/04/2010   Method used:   Electronically to        [facility]  [phone]* (retail)       [address] [place], [place]  [address]       Ph: [phone] or [phone]  Fax: [phone]   RxID:   [id number] CIPRO 500 MG TABS (CIPROFLOXACIN HCL) Take 1 tab twice daily x 7 days  #14 x 0   Entered by:   Lowry Ram NCMA   Authorized by:   Sammuel Cooper PA-c   Signed by:   Lowry Ram NCMA on 09/04/2010   Method used:   Print then Give to Patient   RxID:   [id number]  This Cipro prescription was done in error.

## 2010-12-04 ENCOUNTER — Ambulatory Visit: Payer: Self-pay | Admitting: Licensed Clinical Social Worker

## 2010-12-23 ENCOUNTER — Ambulatory Visit (INDEPENDENT_AMBULATORY_CARE_PROVIDER_SITE_OTHER): Payer: No Typology Code available for payment source | Admitting: Licensed Clinical Social Worker

## 2010-12-23 DIAGNOSIS — F3189 Other bipolar disorder: Secondary | ICD-10-CM

## 2011-01-06 ENCOUNTER — Ambulatory Visit (INDEPENDENT_AMBULATORY_CARE_PROVIDER_SITE_OTHER): Payer: No Typology Code available for payment source | Admitting: Licensed Clinical Social Worker

## 2011-01-06 DIAGNOSIS — F3189 Other bipolar disorder: Secondary | ICD-10-CM

## 2011-01-20 LAB — POCT URINALYSIS DIP (DEVICE)
Bilirubin Urine: NEGATIVE
Glucose, UA: NEGATIVE mg/dL
Glucose, UA: NEGATIVE mg/dL
Nitrite: NEGATIVE
Nitrite: NEGATIVE
Protein, ur: NEGATIVE mg/dL
Specific Gravity, Urine: 1.01 (ref 1.005–1.030)
Urobilinogen, UA: 0.2 mg/dL (ref 0.0–1.0)
Urobilinogen, UA: 0.2 mg/dL (ref 0.0–1.0)

## 2011-01-20 LAB — POCT I-STAT, CHEM 8
Chloride: 110 mEq/L (ref 96–112)
HCT: 36 % (ref 36.0–46.0)
Hemoglobin: 12.2 g/dL (ref 12.0–15.0)
Potassium: 3.7 mEq/L (ref 3.5–5.1)
Sodium: 143 mEq/L (ref 135–145)

## 2011-01-20 LAB — URINE CULTURE
Colony Count: NO GROWTH
Culture: NO GROWTH

## 2011-02-03 ENCOUNTER — Ambulatory Visit (INDEPENDENT_AMBULATORY_CARE_PROVIDER_SITE_OTHER): Payer: No Typology Code available for payment source | Admitting: Licensed Clinical Social Worker

## 2011-02-03 DIAGNOSIS — F3189 Other bipolar disorder: Secondary | ICD-10-CM

## 2011-02-06 LAB — POCT I-STAT, CHEM 8
Creatinine, Ser: 0.9 mg/dL (ref 0.4–1.2)
Glucose, Bld: 88 mg/dL (ref 70–99)
HCT: 37 % (ref 36.0–46.0)
Hemoglobin: 12.6 g/dL (ref 12.0–15.0)
Potassium: 4.3 mEq/L (ref 3.5–5.1)
Sodium: 142 mEq/L (ref 135–145)
TCO2: 23 mmol/L (ref 0–100)

## 2011-02-06 LAB — CBC
Hemoglobin: 12.4 g/dL (ref 12.0–15.0)
MCHC: 34.4 g/dL (ref 30.0–36.0)
MCV: 91.5 fL (ref 78.0–100.0)
RDW: 12.9 % (ref 11.5–15.5)

## 2011-03-02 ENCOUNTER — Ambulatory Visit (INDEPENDENT_AMBULATORY_CARE_PROVIDER_SITE_OTHER): Payer: No Typology Code available for payment source | Admitting: Internal Medicine

## 2011-03-02 DIAGNOSIS — M545 Low back pain, unspecified: Secondary | ICD-10-CM

## 2011-03-03 ENCOUNTER — Ambulatory Visit: Payer: No Typology Code available for payment source | Admitting: Licensed Clinical Social Worker

## 2011-03-20 NOTE — Op Note (Signed)
[place]  Patient:    [name] [name], [name] [name] Visit Number: [id number] MRN: [id number]          Service Type: SUR Location: 3000 [id number] Attending Physician:  Jonne Ply Dictated by:   Stefani Dama, M.D. Proc. Date: 11/22/01 Admit Date:  11/22/2001 Discharge Date: 11/23/2001                             Operative Report  PREOPERATIVE DIAGNOSIS:  Cervical spondylosis with myelopathy and cervical radiculopathy, C4-5 and C5-6.  POSTOPERATIVE DIAGNOSIS:  Cervical spondylosis with myelopathy and cervical radiculopathy, C4-5 and C5-6.  PROCEDURE:  Anterior cervical diskectomy and arthrodesis, C4-5 and C5-6.  SURGEON:  Stefani Dama, M.D.  FIRST ASSISTANT:  Cristi Loron, M.D.  ANESTHESIA:  General endotracheal.  INDICATION:  The patient is a 50 year old individual who has had significant back, shoulder, and arm pain, seemingly worse on the left side.  The patient was found to have significant spondylitic disease at C4-5 and C5-6.  She was advised regarding surgical decompression and stabilization via an anterior diskectomy and arthrodesis with structural allograft and Synthes fixation.  DESCRIPTION OF PROCEDURE:  The patient was brought to the operating room and placed on the table in the supine position.  After the smooth induction of general endotracheal anesthesia, she was placed in five pounds of Holter traction.  The neck was shaved, prepped with Duraprep, and draped in a sterile fashion.  A transverse incision was made on the left side of the neck and carried down through the platysma.  The plane between the sternocleidomastoid and the strap muscles was dissected bluntly until the prevertebral space was reached.  The first identifiable disk space was noted to be that of C5-6. Dissection was then carried cephalad to expose C4-5.  The longus colli muscle was stripped on either side, and then a self-retaining Caspar  retractor was placed in the wound.  A diskectomy was then performed at C4-5 by opening the anterior longitudinal ligament using a combination of Kerrison rongeurs to remove most of the disk material.  Then using loupe magnification and self-retaining disk spreader, the posterior longitudinal ligament area was cleared.  Significant spondylitic osteophytes were encountered on either uncinate process, and these were taken down with the 2 mm Kerrison punch and the high-speed drill.  Once these areas were decompressed, hemostasis was achieved in the soft tissue and epidural veins with some small pledgets of Gelfoam soaked in thrombin.  These were later removed.  Seven millimeter tricortical graft was placed in a reversed fashion in the interspace at C4-5 with the ventral edges being trimmed.  The same attention was given to C5-6, where a similar condition existed.  Here again a 7 mm graft was placed with the tricortical surface facing dorsally.  The back edges were trimmed appropriately.  The traction was then removed.  The neck was placed in slight flexion, and a small-stature Synthes plate measuring 37 mm in length was affixed with six locking 12 x 4 mm screws.  The area was then checked for hemostasis.  The wound was irrigated copiously with antibiotic irrigating solution, and the platysma was closed with 3-0 Vicryl in interrupted fashion. Vicryl 3-0 was used in the subcuticular tissue.  The patient tolerated the procedure well and was returned to the recovery room in stable condition. Dictated by:   Stefani Dama, M.D. Attending Physician:  Jonne Ply DD:  11/22/01 TD:  11/24/01 Job: [id number]
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
## 2011-03-20 NOTE — Discharge Summary (Signed)
[facility]  Patient:    [name] [name], [name] [name] Visit Number: [id number] MRN: [id number]          Service Type: PSY Location: [facility]OP Attending Physician:  Denny Peon Dictated by:   Reymundo Poll Dub Mikes, M.D. Admit Date:  01/11/2002 Disc. Date: 12/19/01                             Discharge Summary  CHIEF COMPLAINT AND HISTORY OF PRESENT ILLNESS:  This was the first admission to [facility] for this 50 year old female who was admitted as she was referred by her primary care physician for thoughts of depression.  Two years of being more reclusive, less social.  Reports that last week prior to this admission her husband went on a business trip for three days and she started crying, could not stop, has been crying ever since. Endorsed increased fatigue, decreased appetite, feeling overwhelmed, poor sleep, sleeping three to five hours per night, frequent awakening for the past two weeks.  Reported she had lost 13 pounds.  States, "I didnt really want to hurt myself, I just wanted to go to sleep and not wake up."  Recent stress of dealing with her 43 year old daughter, full time work as an Print production planner, issues in family, and general stress of caring for children and working full time.  No homicidal ideas, no auditory or visual hallucinations, no panic or symptoms of mania.  PAST PSYCHIATRIC HISTORY:  Denies previous treatment.  SUBSTANCE ABUSE HISTORY:  Denies the use or abuse of any substances.  PAST MEDICAL HISTORY: 1. Status post diskectomy C3-5 approximately three weeks ago. 2. History of total abdominal hysterectomy and bilateral tubal ligation.  MEDICATIONS ON ADMISSION: 1. Darvocet every four hours as needed for pain. 2. Valium 5 mg every four to six hours for muscle spasm.  PHYSICAL EXAMINATION:  GENERAL:  Performed; was compatible with the diskectomy.  MENTAL STATUS EXAMINATION ON ADMISSION:  Healthy appearing, small  built female, fully alert, no acute distress, tearful, crying pretty much constantly through the interview in a calm manner, cooperative, eye contact was poor. Speech was soft in tone, otherwise normal and relevant.  Mood was depressed. Affect was depressed.  Thought processes: Positive for suicidal ideas, no intent, no plan, no homicidal ideas no psychosis or paranoia, no delusions. Cognitive: Well preserved.  ADMITTING DIAGNOSES: Axis I:    Major depression, single episode. Axis II:   No diagnosis. Axis III:  1. Status post C3-C5 diskectomy.            2. Residual left eye droop. Axis IV:   Moderate. Axis V:    Global assessment of functioning upon admission 30, highest global            assessment of functioning in the last year 75.  HOSPITAL COURSE:  She was admitted and started in intensive individual and group psychotherapy.  She was started on Zoloft that was increased up to 50 mg per day and then 75 mg and then 100 mg.  She continued to ruminate about things, having hard time just quieting her mind, worrying a lot, catastrophizing, anticipating the worst.  We started Risperdal 0.25 mg twice a day to which she seemed to respond pretty well.  We had family session.  She was able to deal with the losses that she had had through the years.  The session with the husband and the mother-in-law was positive and  supportive. By February 17 she was improved.  She was going to go home.  Her husband would be not working for the week after discharge so he could with her.  Her mood had improved, affect was brighter.  She had a plan for how to address the negative thoughts and the overwhelming emotions.  She was willing to follow up through the IOP for further stabilization.  Upon discharge in full contact with reality, improved mood, affect bright, no suicidal or homicidal ideas.  DISCHARGE DIAGNOSES: Axis I:    Major depression, single episode. Axis II:   No diagnosis. Axis III:  1.  Status post C3-C5 diskectomy.            2. Residual left eye droop. Axis IV:   Moderate. Axis V:    Global assessment of functioning upon discharge 55-60.  DISCHARGE MEDICATIONS: 1. Risperdal 0.25 mg twice a day. 2. Zoloft 100 mg daily. 3. Valium 5 twice a day as needed. 4. Ambien 10 mg at bedtime for sleep.  FOLLOWUP:  Mental health IOP. Dictated by:   Reymundo Poll Dub Mikes, M.D. Attending Physician:  Denny Peon DD:  01/18/02 TD:  01/18/02 Job: [id number]

## 2011-03-20 NOTE — H&P (Signed)
[facility]  Patient:    [name] [name], PRANGE Visit Number: [id number] MRN: [id number]          Service Type: EMS Location: ED Attending Physician:  Benny Lennert Dictated by:   Young Berry Scott, N.P. Admit Date:  01/08/2002 Discharge Date: 01/08/2002                     Psychiatric Admission Assessment  DATE OF ASSESSMENT:  January 09, 2002 at 10:45 a.m.  IDENTIFYING INFORMATION:  This is a 50 year old Caucasian female who is married.  This is a voluntary admission.  HISTORY OF PRESENT ILLNESS:  This is a readmission for this patient, who was discharged on January 06, 2002.  Please also see the previous admission note. The patient, after returning home, learned, on January 07, 2002, that her daughter was admitted to [facility] because of a drug overdose. The patient called her psychiatrist, requested and got a prescription for Xanax to help her cope through the crisis and then overdosed on approximately 10-20 Xanax.  Said she took all of the prescription that she received of 0.5 mg.  However, the emergency room physician did believe that the patient had possibly exaggerated the amount taken.  The patient endorses ongoing depression, feelings of hopelessness and helplessness and felt that she could no longer cope knowing that her daughter had overdosed.  She is able to contract for safety on the unit but was not able to contract for safety in the community.  The patient reports she has no memory of her overdose or how she got here or the events that happened after she took her overdose.  However, her husband apparently brought her to the emergency room.  She reports compliance with her medications and appointments after her last discharge. She denies any auditory or visual hallucinations or any homicidal ideation.  PAST PSYCHIATRIC HISTORY:  The patient has been followed at [facility]  This is her third [facility]  admission.  The patient does have a prior history of mood lability but no overt history of mania.  The patient has a prior history of suicidal ideation but no prior history of suicide attempt until this event.  SOCIAL HISTORY:  The patient is married.  Her husband works for a trucking firm and is gone many hours, frequently leaving the patient at home with children.  The patient cites stressors as caring for the children at home. She also works as an Print production planner for a Nurse, learning disability here in town.  MEDICAL HISTORY:  Current problems include the fact that patient is status post C3-C5 discectomy, approximately six weeks ago and she is recovering satisfactorily from that.  MEDICATIONS:  Zoloft 50 mg daily, Zyprexa 5 mg at h.s., Protonix 40 mg p.o. q.d. and Lithobid 300 mg p.o. q.a.m. and 600 mg p.o. q.h.s.  DRUG ALLERGIES:  None.  POSITIVE PHYSICAL FINDINGS:  The patients physical examination was done in the emergency room where it was essentially unremarkable.  On admission to the unit, her vital signs were temperature 97.4, pulse 68, respirations 18, blood pressure 99/63.  This morning, her vital signs are temperature 99.1, pulse 75, respirations 18, blood pressure 101/73.  She weighs approximately 125 pounds and is approximately 5 feet 2 inches tall.  LABORATORY DATA:  Lithium level is 0.74 mg.  Her alcohol level was less than 5.  Her sodium was mildly decreased at 132; otherwise electrolytes were within normal limits.  Her BUN  was 11, creatinine 0.8.  Her urine was positive for benzodiazepines but negative for all other substances.  Because she recently had a full set of basic labs, CBC and thyroid panel, those were within normal limits on the previous admission and were not repeated.  MENTAL STATUS EXAMINATION:  This is a fully alert, healthy-appearing female, who is in no acute distress with a constricted affect.  She is however pleasant and cooperative.  Speech is normal  and without pressure.  Mood is depressed, hopeless and helpless.  Thought process is logical and goal directed.  She is positive for some suicidal ideation without any specific intent at this time.  She is able to promise safety on the unit.  There is no evidence of psychosis or homicidal ideation.  Cognitively, she is intact and oriented x 3.  DIAGNOSES: Axis I:    Major depression not otherwise specified. Axis II:   Rule out personality disorder not otherwise specified with            dependent features. Axis III:  Status post C3-C5 discectomy. Axis IV:   Moderate (problems with the primary support group being pressures            of managing a family and handling child care and juggling her job). Axis V:    Current 34; past year 96.  PLAN:  Voluntarily admit the patient to stabilize her mood and to alleviate her suicidal ideation.  We will increase her Zoloft to 75 mg p.o. daily.  Ask the casemanager to assist her with family issues and possibly schedule a family session with her husband and we will see how she responds and consider increasing her lithium.  ESTIMATED LENGTH OF STAY:  Three to four days. Dictated by:   Young Berry Scott, N.P. Attending Physician:  Benny Lennert DD:  01/10/02 TD:  01/10/02 Job: [id number]

## 2011-03-20 NOTE — Discharge Summary (Signed)
[facility]  Patient:    [name] [name], RANERI Visit Number: [id number] MRN: [id number]          Service Type: PSY Location: [facility] Attending Physician:  Denny Peon Dictated by:   Jeanice Lim, M.D. Admit Date:  01/11/2002 Discharge Date: 01/19/2002                             Discharge Summary  IDENTIFYING DATA:  This is a 50 year old Caucasian female, married, voluntarily admitted, referred by her primary care physician for severe depression, feeling unable to care for herself and having suicidal thoughts.  MEDICATIONS:  Darvocet, Valium.  DRUG ALLERGIES:  No known drug allergies.  PHYSICAL EXAMINATION:  Essentially within normal limits.  Neurologically nonfocal.  LABORATORY DATA:  Routine admission labs essentially within normal limits.  MENTAL STATUS EXAMINATION:  Healthy-appearing, petite, small-built Caucasian female fully alert and in no acute distress.  Tearful affect.  Crying throughout the interview.  Eye contact poor.  Speech soft.  Mood depressed and sad.  Thought process goal directed.  Thought content negative for psychotic symptoms or homicidal ideation, admitting to positive suicidal ideation without specific intent.  ADMISSION DIAGNOSES: Axis I:    Major depression, single episode, moderate. Axis II:   None. Axis III:  Status post C3, C5 discectomy with residual left-eye droop. Axis IV:   Moderate (problems with primary support). Axis V:    30/75.  HOSPITAL COURSE:  The patient was admitted and started on routine p.r.n.s and restarted on Darvocet and Valium.  Zoloft was optimized, targeting depressive symptoms and Risperdal.  The patient tolerated medication changes well.  CONDITION ON DISCHARGE:  Markedly improved.  Mood was more euthymic.  Affect brighter.  Thought process goal directed.  Thought content negative for dangerous ideation or psychotic symptoms.  DISCHARGE MEDICATIONS: 1. Risperdal 0.25 mg  b.i.d. 2. Zoloft 100 mg q.d. 3. Valium 5 mg q.d. 4. Ambien 10 mg q.h.s. Dictated by:   Jeanice Lim, M.D. Attending Physician:  Denny Peon DD:  02/15/02 TD:  02/16/02 Job: [id number]

## 2011-03-20 NOTE — Discharge Summary (Signed)
[facility]  Patient:    BALEIGH, RENNAKER Visit Number: [id number] MRN: [id number]          Service Type: PSY Location: [facility] Attending Physician:  Denny Peon Dictated by:   Reymundo Poll Dub Mikes, M.D. Admit Date:  01/11/2002 Discharge Date: 01/19/2002                             Discharge Summary  CHIEF COMPLAINT AND PRESENT ILLNESS:  This was a readmission for this 50 year old female, discharged on January 06, 2002.  Upon returning home on January 07, 2002, she learned that her daughter was admitted to [facility] because of a drug overdose.  Court-ordered psychiatrist requested and got a prescription for Xanax to help her cope through the crisis and then overdosed on 10-20 Xanax.  Took all of the prescription that she received of 0.5 mg. The emergency room physician did believe that the patient had possibly exaggerated the amount taken.  The patient endorsed ongoing depression, feeling of hopelessness and helplessness.  Felt that she could no longer cope knowing that her daughter had overdosed.  She is able to contract for safety on the unit.  Will not contract for safety in the community.  Reports she has no memory of the overdose or the events that happened after she took the overdose.  PAST PSYCHIATRIC HISTORY:  Followed at [facility].  [facility].  Prior history of mood lability but no overt mania.  MEDICAL HISTORY:  Status post C3, C5 diskectomy six weeks prior to this admission; recovery satisfactory.  MEDICATIONS:  Zoloft 50 mg daily, Zyprexa 5 mg at bedtime, Protonix 40 mg and Lithobid 300 mg in the morning and 600 mg at bedtime.  PHYSICAL EXAMINATION:  Performed and failed to show any acute findings.  MENTAL STATUS EXAMINATION:  Fully alert, healthy-appearing female who is in no acute distress, constricted affect.  However, pleasant and cooperative. Speech was normal and without  pressure.  Mood is depressed, hopeless and helpless.  Thought process logical and goal directed.  Positive for some suicidal ideation without any specific intent at the time of this evaluation. Cognition well-preserved.  ADMISSION DIAGNOSES: Axis I:    1. Major depression, recurrent.            2. Anxiety disorder not otherwise specified.            3. Rule out bipolar disorder, type 2. Axis II:   Personality disorder not otherwise specified. Axis III:  Status post C3, C5 diskectomy. Axis IV:   Moderate. Axis V:    Global Assessment of Functioning upon admission 34; highest Global            Assessment of Functioning in the last year 78.  HOSPITAL COURSE:  She was admitted and started intensive individual and group psychotherapy.  She felt that Zyprexa was not as effective as the Risperdal was for her.  She was feeling very depressed, still saying that she would have wanted to die.  Persistent suicidal ruminations.  We went ahead and discontinued the Zyprexa and started Risperdal as she felt that she felt better on this medication.  By January 11, 2002, she had worked on herself and coping skills, how to handle the thoughts.  She was back on Risperdal and far more hopeful that things will start working from now on.  She was willing to commit to safety, so we went  ahead and discharged her back to outpatient treatment.  DISCHARGE DIAGNOSES: Axis I:    Major depression, recurrent, versus bipolar disorder, type 2,            depressed. Axis II:   Personality disorder not otherwise specified. Axis III:  Status post C3, C5 diskectomy. Axis IV:   Moderate. Axis V:    Global Assessment of Functioning upon discharge 55-60.  DISCHARGE MEDICATIONS: 1. Lithobid 300 mg in the morning and 600 mg at night. 2. Zoloft 100 mg daily. 3. Risperdal 0.25 mg twice a day and 2 at bedtime.  FOLLOW-UP:  [facility]. Dictated by:   Reymundo Poll Dub Mikes, M.D. Attending Physician:  Denny Peon DD:   03/01/02 TD:  03/03/02 Job: [id number]

## 2011-03-20 NOTE — Op Note (Signed)
[facility]  Patient:    [name] [name], [name] [name]                     MRN: [id number] Proc. Date: 04/05/00 Adm. Date:  [id number] Attending:  Donne Hazel                           Operative Report  PREOPERATIVE DIAGNOSIS:  Pelvic pain.  POSTOPERATIVE DIAGNOSIS:  Pelvic pain.  OPERATION PERFORMED:  Total vaginal hysterectomy  SURGEON:  Willey Blade, M.D.  ASSISTANT:  Trevor Iha, M.D.  ANESTHESIA:  General endotracheal.  ESTIMATED BLOOD LOSS:  200 cc.  OPERATION PERFORMED:  A normal uterus was encountered.  The ovaries were visualized also and noted to be normal bilaterally.  The patient had previously had bilateral tubal ligation.  COMPLICATIONS:  None.  DRAINS:  Foley catheter.  DESCRIPTION OF PROCEDURE:  The patient was taken to the operating room where general endotracheal anesthetic was administered.  The patient was placed on the operating table in the dorsal lithotomy position, the perineum and vagina were prepped and draped in the usual sterile fashion with Betadine and sterile drapes.  A weighted speculum was placed in the posterior fornix of the vagina and the cervix was grasped with a Jacobs tenaculum.  The posterior cul-de-sac was atraumatically entered.  Next, the uterosacral ligaments were clamped bilaterally with curved Heaney clamps.  The vascular pedicles were then sharply created bilaterally and suture ligated with a transfixing suture of 0 Vicryl.  Next, a circumferential incision was made around the anterior portion of the cervix and a bladder flap created over the lower uterine segment.  The bladder flap was then dissected cephalad, a Deaver was placed behind this to ensure its protection during the procedure.  Successive bites were then carried up the body of the uterus using curved Heaney clamps.  All vascular pedicles were sharply created and suture ligated with 0 Vicryl suture.  This included dissection of the  broad ligament and incorporating the uterine artery pedicles.  All of these vascular pedicles were created very closely to the body of the uterus and suture ligated with 0 Vicryl suture.  This dissection was carried out until the uterine ovarian ligaments were encountered.  The fundal portion of the uterus was delivered posteriorly through the incision with a thyroid tenaculum and the uterine ovarian ligaments clamped bilaterally with curved Heaney clamps.  The uterus was then dissected free.  The uterine ovarian ligaments were then doubly ligated first with a 0 Vicryl free tie and then a transfixing 0 Vicryl suture.  Attention was then turned to closure. All vascular pedicles were visualized and noted to be hemostatic.  The posterior cul-de-sac was then closed and obliterated by transfixing the uterosacral ligaments in the midline.  This was done with a transfixing suture of 0 Vicryl and plicating the uterosacrals in the midline x 2.  Next, the uterosacral ligaments were then used to attach the vaginal cuff for suspension.  This was done bilaterally with a figure-of-eight suture of 0 Vicryl.  The vaginal cuff was then closed in an anterior to posterior fashion with multiple interrupted figure-of-eight sutures of 0 Vicryl.  A Foley catheter was then placed with clear fluid returned.  Final sponge, needle and instrument counts were correct x 3.  There were no perioperative complications.  The patient did receive a preoperative antibiotic. DD:  04/05/00 TD:  04/07/00 Job: [id number]

## 2011-03-20 NOTE — Discharge Summary (Signed)
[facility]  Patient:    [name] [name], [name] [name]                     MRN: [id number] Adm. Date:  [id number] Disc. Date: [id number] Attending:  Donne Hazel                           Discharge Summary  HISTORY OF PRESENT ILLNESS:   Ms. [name] [name] is a 50 year old female, gravida 3, para 3, admitted for total vaginal hysterectomy due to severe pelvic pain.  The patient underwent conservative medical therapy which she failed to respond to.  She is ow admitted for operative treatment of her pelvic pain.  She was thoroughy counseled regarding the risks, benefits, and failure rate of this procedure.  PAST MEDICAL HISTORY:         1) History of smoking.  2) History of tension headaches.  PAST SURGICAL HISTORY:        Laparoscopy 2001.  Status post bilateral tubal ligation.  PAST OBSTETRIC HISTORY:       Normal spontaneous vaginal delivery x 3 at term.  MEDICATIONS:                  1. Vicodin p.r.n.                               2. Tylenol PM p.r.n.  ALLERGIES:                    CODEINE.  PHYSICAL EXAMINATION:         Please see clinic admission history and physical.   ADMISSION DIAGNOSIS:          Pelvic pain.  HOSPITAL COURSE:              The patient was admitted on same day of surgery, une 4, 2001, where she underwent a total vaginal hysterectomy.  The surgery went well and estimated blood loss was 200 cc.  The patients postoperative course was uneventful.  She was slowly advanced to a regular diet which she was tolerating  well prior to discharge.  She quickly resumed normal bowel and bladder function. Her hemoglobin stabilized at 10.9 postoperatively.  She was ambulatory and met ll expected goals in the postoperative period.  She is routinely discharged on postoperative day #2 in stable condition.  DISCHARGE DIAGNOSES:          1. Pelvic pain.                               2. Status post total vaginal hysterectomy.          3. Uneventful hospital course.  PLAN:                         1) Home.  2) Postoperative instructions given. 3) Percocet #40.  4) Follow up in the office in one to two weeks for a postoperative evaluation. DD:  04/16/00 TD:  04/19/00 Job: [id number]

## 2011-03-20 NOTE — H&P (Signed)
[facility]  Patient:    [name] [name]                     MRN: [id number] Adm. Date:  [id number] Disc. Date: [id number] Attending:  Donne Hazel                         History and Physical  HISTORY OF PRESENT ILLNESS:  [name] [name] is a 50 year old female, admitted for total vaginal hysterectomy due to severe pelvic pain.  The patient recently underwent laparoscopy for this severe pelvic pain.  There were minimal findings at that time.  She continued to have some rather significant pelvic pain in the past, about three months.  She is now admitted for definitive therapy in the form of total vaginal hysterectomy.  The patient was counseled regarding a less aggressive medical treatment.  She declined this and requested total vaginal hysterectomy with ovarian preservation.  MEDICAL HISTORY: 1. History of smoking. 2. History of tension headaches.  SURGICAL HISTORY: 1. Laparoscopy, 2001. 2. Status post bilateral tubal ligation.  OBSTETRICAL HISTORY:  Normal spontaneous vaginal delivery x 2 at term.  CURRENT MEDICATIONS:  Vicodin p.r.n. and Tylenol P.M.  ALLERGIES:  CODEINE.  PHYSICAL EXAMINATION:  VITAL SIGNS:  Stable, temperature 98.6, heart rate 76, respirations 18, blood pressure 112/70.  GENERAL:  She is a well-developed, well-nourished female in no acute distress.  HEENT:  Within normal limits.  NECK:  Supple without adenopathy or thyromegaly.  HEART:  Regular rate and rhythm without murmur, gallop or rub.  LUNGS:  Clear to auscultation.  ABDOMEN:  Soft, benign, without masses or organomegaly.  EXTREMITIES:  Grossly normal.  NEUROLOGIC:  Grossly normal.  BREASTS:  Exam done recently in the office was normal but is deferred upon admission.  PELVIC:  Normal external female genitalia.  Vagina and cervix clear.  The uterus is small, mildly tender and mobile; the adnexa is also mildly tender without mass.  ADMITTING  DIAGNOSIS:  Pelvic pain.  PLAN:  Total vaginal hysterectomy.  DISCUSSION:  Risks and benefits of surgery are explained to patient, the risks of bleeding, infection and risk of injury to surrounding organs explained regarding total vaginal hysterectomy.  She was allowed to ask questions and wished to proceed with total vaginal hysterectomy and ovarian preservation.DD: 04/05/00 TD:  04/05/00 Job: [id number]

## 2011-03-20 NOTE — H&P (Signed)
[place]  Patient:    [name] [name], [name] [name] Visit Number: [id number] MRN: [id number]          Service Type: MED Location: [phone] Attending Physician:  [name] [name] Dictated by:   [name] [name], N.P. Admit Date:  04/10/2002 Discharge Date: 04/11/2002   CC:         [name] [name], M.D.  [name] [name], M.D.   History and Physical  DATE OF BIRTH:  1961/10/01  PRIMARY CARE [name] [name]:  Dr. Onalee Hua B. Massey.  PSYCHIATRIST:  Dr. Daine [name] at [facility].  HISTORY OF PRESENT ILLNESS:  The patient is [name] very pleasant 50 year old, history of tobacco abuse, positive family history of late-in-life CAD, history of depression/? bipolar disorder.  She was started on multi-drug regimen for this over the past four months.  The patient notes fatigue over the last four to five days.  The morning of admission, she was sedentary at work and developed severe mid-substernal chest burning radiating to her throat.  Discomfort would come and go in waves (intense for 1 minute, ease off for about 30 seconds, then recur) with cycles lasting [name] total of 30 minutes.  She does have residual burning which is mild currently.  She reported to primary M.D. clinic and EKG was sought, with minimal worsening of underlying ST changes compared with EKG, September 2001, baseline abnormality.  GI cocktail in the emergency room resulted in no significant change in discomfort.  Sublingual nitrate seemed to ease discomfort.  PREVIOUS MEDICAL HISTORY: 1. Depression; ? bipolar, followed by Dr. Betti [name] at [facility], on    multi-drug regimen. 2. (January 2003) Cervical spine fusion/plate placed by Dr. Stefani [name]. 3. Hysterectomy with USO secondary to fibroids. 4. ? Cerebrovascular disease; head CT done secondary to headache earlier this    year revealed small lacunar right basilar ganglia. 5. GERD secondary to H. pylori  approximately two years earlier, resolved with    medical therapy.  ALLERGIES:  To ZYPREXA causing [name] headache and vivid dreams, ? to FLUORESCEIN.  MEDICATIONS: [name]. Zoloft 150 mg p.o. q.d. started February 2003. B. Depakote 500 mg two tabs p.o. q.h.s. started May 2003. C. Risperdal 0.25 mg p.o. b.i.d. started February 2003.  SOCIAL HISTORY AND HABITS:  Tobacco off and on, less than one pack per year, but quit for good, January 2003.  ETOH/illicit drugs:  Negative.  She has three children, [name] 11 year old, [name] 86 year old and [name] 69-year-old alive and well. This is her second marriage of eight years.  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  FAMILY HISTORY:  Mother died at age 57 of ulcers from NSAID abuse, had hypertension, asthma and bronchitis, also thyroid disease.  Father died at age 63 of MI/ETOH.  One brother alive and well.  REVIEW OF SYSTEMS:  Over the last two to three months, episodic disequilibrium, lightheadedness, no particular pattern, occurs [name] few times per day.  She notes episodic dysphagia off and on.  She also noted initial constipation thought related to her psychiatric medications which has resolved.  Negative melena nor bright red blood PR.  Denies dysuria nor hematuria.  Some lower extremity ankle swelling yesterday.  Notes episodic forgetfulness as of late; this has been brought to her attention by her family members as well.  PHYSICAL EXAMINATION: (As dictated by Dr. Hillary [name].)  VITAL SIGNS:  Blood pressure 110/72, heart rate 57 and regular, respiratory rate 16,  temperature 96.8, O2 saturation 98% on room air.  GENERAL:  She is [name] well-nourished, pleasantly conversant 50 year old female appearing slightly anxious.  HEENT/NECK:  Brisk bilateral carotid upstroke without bruit.  No significant JVD nor thyromegaly.  CARDIAC:  Regular rate and rhythm without murmur, rub nor gallop.  Normal S1 and S2.  CHEST:  Lung sounds clear  with equal bilateral excursion.  Negative CVA tenderness.  ABDOMEN:  Soft, nondistended, normoactive bowel sounds.  Negative abdominal aortic, renal nor femoral bruit.  Nontender to applied pressure.  No masses nor organomegaly.  EXTREMITIES:  Distal pulses intact though she has decreased radial pulses. Negative pedal edema.  NEUROLOGIC:  Cranial nerves II-XII grossly intact, alert and oriented x3.  GENITAL:  Deferred.  RECTAL:  Deferred.  LABORATORY TESTS AND DATA:  Hemoglobin 13.1, hematocrit of 38.4, platelets low at 119, WBC 6.2.  PT 13, INR 0.9, PTT of 27.  Sodium 138, potassium of 4.2, chloride 105, CO2 29, BUN 12, creatinine 0.8 and glucose 86, calcium 9.0.  CK of 68, MB fraction of 0.4, troponin I 0.01.  Pro time of 13, INR of 0.9, PTT 27.  IMPRESSION: (As dictated by Dr. Hillary [name].) 1. Episode of prolonged chest burning sensation with electrocardiogram    revealing slight worsening of nonspecific ST-T wave abnormalities compared    to baseline electrocardiogram, September 2001.  First set of CK-MB greater    than eight hours following chest discomfort are negative. 2. Thrombocytopenia with platelet count normal, January 2002; currently,    119,000.  Suspect may be secondary to Depakote. 3. History of depression, adequate control on current medical regimen.  PLAN:  (As dictated by Dr. Hillary [name].) 1. Will admit to telemetry with rule out MI protocol with serial cardiac    enzymes and daily EKG. 2. Will not start on Lovenox or aspirin in view of thrombocytopenia and    low-risk indications. 3. Stress Cardiolite in the morning, with further recommendations pending that    result.  COMMENT:  Suspect chest discomfort may be GI in etiology.  The patient has been under significant stressors. Dictated by:   [name] [name], N.P. Attending Physician:  [name] [name] DD:  04/11/02 TD:  04/13/02  Job: [id number]

## 2011-03-31 ENCOUNTER — Ambulatory Visit (INDEPENDENT_AMBULATORY_CARE_PROVIDER_SITE_OTHER): Payer: No Typology Code available for payment source | Admitting: Licensed Clinical Social Worker

## 2011-03-31 DIAGNOSIS — F3189 Other bipolar disorder: Secondary | ICD-10-CM

## 2011-04-14 ENCOUNTER — Ambulatory Visit (INDEPENDENT_AMBULATORY_CARE_PROVIDER_SITE_OTHER): Payer: No Typology Code available for payment source | Admitting: Licensed Clinical Social Worker

## 2011-04-14 DIAGNOSIS — F3189 Other bipolar disorder: Secondary | ICD-10-CM

## 2011-05-08 ENCOUNTER — Other Ambulatory Visit: Payer: Self-pay | Admitting: Pediatrics

## 2011-05-12 ENCOUNTER — Ambulatory Visit (INDEPENDENT_AMBULATORY_CARE_PROVIDER_SITE_OTHER): Payer: No Typology Code available for payment source | Admitting: Licensed Clinical Social Worker

## 2011-05-12 DIAGNOSIS — F3189 Other bipolar disorder: Secondary | ICD-10-CM

## 2011-05-23 DIAGNOSIS — Z0000 Encounter for general adult medical examination without abnormal findings: Secondary | ICD-10-CM

## 2011-06-04 ENCOUNTER — Other Ambulatory Visit: Payer: Self-pay | Admitting: *Deleted

## 2011-06-04 MED ORDER — ESTRADIOL 0.1 MG/24HR TD PTTW: 1.0000 | MEDICATED_PATCH | TRANSDERMAL | Status: DC

## 2011-06-04 NOTE — Telephone Encounter (Signed)
PHARMACY FAXED REFILL REQUEST. PT HAS ANNUAL SCHEDULED FOR 06/19/11,PLEASE E-SCRIBE REFILLS IF ANY.

## 2011-06-04 NOTE — Telephone Encounter (Signed)
Overdue for annual exam, no more refills after this one

## 2011-06-09 ENCOUNTER — Ambulatory Visit (INDEPENDENT_AMBULATORY_CARE_PROVIDER_SITE_OTHER): Payer: No Typology Code available for payment source | Admitting: Licensed Clinical Social Worker

## 2011-06-09 DIAGNOSIS — F3189 Other bipolar disorder: Secondary | ICD-10-CM

## 2011-06-19 ENCOUNTER — Encounter: Payer: Self-pay | Admitting: Gynecology

## 2011-06-19 ENCOUNTER — Encounter: Payer: No Typology Code available for payment source | Admitting: Gynecology

## 2011-06-19 ENCOUNTER — Other Ambulatory Visit (HOSPITAL_COMMUNITY)
Admission: RE | Admit: 2011-06-19 | Discharge: 2011-06-19 | Disposition: A | Discharge status: Home / Self Care | Payer: No Typology Code available for payment source | Source: Ambulatory Visit | Attending: Gynecology | Admitting: Gynecology

## 2011-06-19 ENCOUNTER — Ambulatory Visit (INDEPENDENT_AMBULATORY_CARE_PROVIDER_SITE_OTHER): Payer: No Typology Code available for payment source | Admitting: Gynecology

## 2011-06-19 VITALS — BP 116/70 | Ht 60.0 in | Wt 121.0 lb

## 2011-06-19 DIAGNOSIS — F419 Anxiety disorder, unspecified: Secondary | ICD-10-CM | POA: Insufficient documentation

## 2011-06-19 DIAGNOSIS — Z01419 Encounter for gynecological examination (general) (routine) without abnormal findings: Secondary | ICD-10-CM | POA: Insufficient documentation

## 2011-06-19 DIAGNOSIS — Z131 Encounter for screening for diabetes mellitus: Secondary | ICD-10-CM

## 2011-06-19 DIAGNOSIS — N898 Other specified noninflammatory disorders of vagina: Secondary | ICD-10-CM

## 2011-06-19 DIAGNOSIS — D229 Melanocytic nevi, unspecified: Secondary | ICD-10-CM

## 2011-06-19 DIAGNOSIS — D239 Other benign neoplasm of skin, unspecified: Secondary | ICD-10-CM

## 2011-06-19 DIAGNOSIS — N951 Menopausal and female climacteric states: Secondary | ICD-10-CM

## 2011-06-19 DIAGNOSIS — F319 Bipolar disorder, unspecified: Secondary | ICD-10-CM | POA: Insufficient documentation

## 2011-06-19 DIAGNOSIS — B373 Candidiasis of vulva and vagina: Secondary | ICD-10-CM

## 2011-06-19 DIAGNOSIS — G43909 Migraine, unspecified, not intractable, without status migrainosus: Secondary | ICD-10-CM | POA: Insufficient documentation

## 2011-06-19 DIAGNOSIS — Z13220 Encounter for screening for lipoid disorders: Secondary | ICD-10-CM

## 2011-06-19 MED ORDER — FLUCONAZOLE 150 MG PO TABS: 150.0000 mg | ORAL_TABLET | Freq: Once | ORAL | Status: AC

## 2011-06-19 NOTE — Progress Notes (Signed)
[name] [name] 01/05/1961 [id number]        50 y.o.  for annual exam.  Patient notes that she had been on the Vivelle 0.1 mg patch doing well up until the last month or so when hot flushes seem to recur and that she just stopped and did not use the patch for the last month. She notes unacceptable hot flushes and night sweats now.  She is status post abdominal hysterectomy in the past and she thought that she had one ovary removed but could not remember which one. On ultrasound last year she had what appeared to be both ovaries and we previously discussed whether one area was scar tissue or whether she does have both ovaries.  Patient also has a mole on her chest and she wants me to take a look at.  Past medical history,surgical history, allergies, family history and social history were all reviewed and documented in the EPIC chart. ROS:  Was performed and pertinent positives and negatives are included in the history.  Exam: chaperone present Filed Vitals:   06/19/11 0957  BP: 116/70   General appearance  Normal Skin raised pigmented mole upper aspect Right breast Head/Neck normal with no cervical or supraclavicular adenopathy thyroid normal Lungs  clear Cardiac RR, without RMG Abdominal  soft, nontender, without masses, organomegaly or hernia Breasts  examined lying and sitting without masses, retractions, discharge or axillary adenopathy. Pelvic  Ext/BUS/vagina  normal white discharge noted KOH wet prep done.  Pap of cuff done  Adnexa  Without masses or tenderness    Anus and perineum  normal   Rectovaginal  normal sphincter tone without palpated masses or tenderness.    Assessment/Plan:  50 y.o. female for annual exam.    #1 Chest wall mole. Patient notes the mole has enlarged a little and now is tender. Recommended excision and offered referral to dermatology versus I could excise that in the office. I reviewed with her though I cannot guarantee she will not have a scar and  she said this is of no consequence and she wants me to excise this and she will make an appointment. #2 Menopausal symptoms. Patient finds that her symptoms are unacceptable. We rediscussed the issues of ERT, WHI study, ACOG and NAMS recommendations. Increased risk of stroke, heart attack, and DVT possible increased risk of breast cancer reviewed. Issues of transdermal versus oral with first pass effect reviewed. Patient wants to retry ERT I gave her a sample month of Estrogel 0.06% initial start with 1 pump daily.  I also gave her a sample week of Vivelle 0.1 mg patches. She will start with the Estrogel if this doesn't seem to work she'll try the patches again. She'll call me in response regardless ultimately if neither of these work then we will go to oral estradiol 1 mg daily and increase as needed. #3 Vaginal discharge. Wet prep is positive for yeast we'll treat with Diflucan 150 mg daily x1. #4 Health maintenance. Self breast exams on a monthly basis discussed urged. Had mammogram October 2011 and she knows to repeat this coming fall. I did a baseline CBC lipid profile glucose urinalysis and also a TSH given her recent hot flush history just to make sure that a thyroid abnormality is not compounding her symptoms.    [name], 10:39 AM 06/19/2011

## 2011-07-28 ENCOUNTER — Telehealth: Payer: Self-pay | Admitting: *Deleted

## 2011-07-28 NOTE — Telephone Encounter (Signed)
(  office visit 06/19/11) Pt calling to follow up with the estrogel 0.06% is working okay . She would like to know if it comes in a higher dose? If so she would like that dose, but if not the 0.06% dose is fine. Pt says she still has some hot flashes but not extremely bad. Other wise No other complaints. Please advise.

## 2011-07-28 NOTE — Telephone Encounter (Signed)
This patient is using one pump she can increased to 2 pumps daily and see if that doesn't help

## 2011-07-29 ENCOUNTER — Other Ambulatory Visit: Payer: Self-pay | Admitting: *Deleted

## 2011-07-29 MED ORDER — ESTRADIOL 0.75 MG/1.25 GM (0.06%) TD GEL: 1.2500 g | Freq: Every day | TRANSDERMAL | Status: DC

## 2011-07-29 NOTE — Telephone Encounter (Signed)
PT INFORMED [name] [name]

## 2011-08-11 ENCOUNTER — Ambulatory Visit (INDEPENDENT_AMBULATORY_CARE_PROVIDER_SITE_OTHER): Payer: No Typology Code available for payment source | Admitting: Licensed Clinical Social Worker

## 2011-08-11 DIAGNOSIS — F3189 Other bipolar disorder: Secondary | ICD-10-CM

## 2011-08-18 ENCOUNTER — Other Ambulatory Visit: Payer: Self-pay | Admitting: Internal Medicine

## 2011-08-18 DIAGNOSIS — Z1231 Encounter for screening mammogram for malignant neoplasm of breast: Secondary | ICD-10-CM

## 2011-08-26 DIAGNOSIS — G43909 Migraine, unspecified, not intractable, without status migrainosus: Secondary | ICD-10-CM

## 2011-09-10 ENCOUNTER — Ambulatory Visit (INDEPENDENT_AMBULATORY_CARE_PROVIDER_SITE_OTHER): Payer: No Typology Code available for payment source | Admitting: Licensed Clinical Social Worker

## 2011-09-10 DIAGNOSIS — F3189 Other bipolar disorder: Secondary | ICD-10-CM

## 2011-09-18 ENCOUNTER — Ambulatory Visit
Admission: RE | Admit: 2011-09-18 | Discharge: 2011-09-18 | Disposition: A | Discharge status: Home / Self Care | Payer: No Typology Code available for payment source | Source: Ambulatory Visit | Attending: Internal Medicine | Admitting: Internal Medicine

## 2011-09-18 DIAGNOSIS — Z1231 Encounter for screening mammogram for malignant neoplasm of breast: Secondary | ICD-10-CM

## 2011-10-08 ENCOUNTER — Ambulatory Visit (INDEPENDENT_AMBULATORY_CARE_PROVIDER_SITE_OTHER): Payer: No Typology Code available for payment source | Admitting: Licensed Clinical Social Worker

## 2011-10-08 DIAGNOSIS — F3189 Other bipolar disorder: Secondary | ICD-10-CM

## 2011-11-19 ENCOUNTER — Ambulatory Visit (INDEPENDENT_AMBULATORY_CARE_PROVIDER_SITE_OTHER): Payer: No Typology Code available for payment source | Admitting: Licensed Clinical Social Worker

## 2011-11-19 DIAGNOSIS — F3189 Other bipolar disorder: Secondary | ICD-10-CM

## 2011-12-09 ENCOUNTER — Emergency Department (HOSPITAL_COMMUNITY): Payer: No Typology Code available for payment source

## 2011-12-09 ENCOUNTER — Emergency Department (INDEPENDENT_AMBULATORY_CARE_PROVIDER_SITE_OTHER)
Admission: EM | Admit: 2011-12-09 | Discharge: 2011-12-09 | Disposition: A | Discharge status: Home / Self Care | Payer: No Typology Code available for payment source | Source: Home / Self Care | Attending: Emergency Medicine | Admitting: Emergency Medicine

## 2011-12-09 ENCOUNTER — Encounter (HOSPITAL_COMMUNITY): Payer: Self-pay | Admitting: Emergency Medicine

## 2011-12-09 ENCOUNTER — Emergency Department (INDEPENDENT_AMBULATORY_CARE_PROVIDER_SITE_OTHER): Payer: No Typology Code available for payment source

## 2011-12-09 DIAGNOSIS — J329 Chronic sinusitis, unspecified: Secondary | ICD-10-CM

## 2011-12-09 DIAGNOSIS — R918 Other nonspecific abnormal finding of lung field: Secondary | ICD-10-CM

## 2011-12-09 DIAGNOSIS — R05 Cough: Secondary | ICD-10-CM

## 2011-12-09 DIAGNOSIS — R059 Cough, unspecified: Secondary | ICD-10-CM

## 2011-12-09 MED ORDER — AMOXICILLIN-POT CLAVULANATE 500-125 MG PO TABS: 1.0000 | ORAL_TABLET | Freq: Three times a day (TID) | ORAL | Status: AC

## 2011-12-09 MED ORDER — PREDNISONE 20 MG PO TABS: 40.0000 mg | ORAL_TABLET | Freq: Every day | ORAL | Status: AC

## 2011-12-09 NOTE — ED Provider Notes (Addendum)
Lonni Fix I don't think at had couple chapters I stopped being a History     CSN: [id number]  Arrival date & time 12/09/11  1347   First MD Initiated Contact with Patient 12/09/11 1508      Chief Complaint  Patient presents with  . Sinusitis  . GI Problem    (Consider location/radiation/quality/duration/timing/severity/associated sxs/prior treatment) HPI Comments: Mrs. Neale Burly presents to the urgent care Center today complaining of productive cough usually matinal as described "big gunks of phlegm with unusual color 3-4 at a time mainly in the mornings been having the symptoms for more than a month. Since Monday been also complaining of sinus pressure (points to right maxillary sinus) pressure and nasal discharge. Had also been noticing nausea and when I cough or take a deep breath discomfort on the left upper quadrant area (points to left rib region). Denies fevers denies shortness of breath. Also feeling somewhat nauseous but no vomiting and no diarrhea.  Patient is a 51 y.o. female presenting with sinusitis and GI illlness. The history is provided by the patient.  Sinusitis  Associated symptoms include congestion, sinus pressure, cough and shortness of breath.  GI Problem  Associated symptoms include cough. Pertinent negatives include no abdominal pain.    Past Medical History  Diagnosis Date  . Bipolar disorder   . Migraine   . Helicobacter pylori (H. pylori) 07/2010  . Anxiety     Past Surgical History  Procedure Date  . Cervical fusion   . Tubal ligation   . Upper gastrointestinal endoscopy   . Abdominal hysterectomy 2001    USO by history-sono2011 question of both ovaries present    Family History  Problem Relation Age of Onset  . Hypertension Mother   . Hypertension Brother     History  Substance Use Topics  . Smoking status: Never Smoker   . Smokeless tobacco: Never Used  . Alcohol Use: No    OB History    Grav Para Term Preterm Abortions TAB SAB Ect Mult  Living   4 3   1  1   3       Review of Systems  Constitutional: Positive for fatigue. Negative for fever, diaphoresis and activity change.  HENT: Positive for congestion and sinus pressure. Negative for drooling, mouth sores, trouble swallowing, neck pain and voice change.   Respiratory: Positive for cough and shortness of breath. Negative for chest tightness.   Cardiovascular: Negative.   Gastrointestinal: Negative for nausea, abdominal pain and diarrhea.  Genitourinary: Negative for dysuria, frequency, flank pain and enuresis.    Allergies  Review of patient's allergies indicates no known allergies.  Home Medications   Current Outpatient Rx  Name Route Sig Dispense Refill  . ALPRAZOLAM 0.5 MG PO TABS Oral Take 0.5 mg by mouth 3 (three) times daily.      Marland Kitchen LAMOTRIGINE 100 MG PO TABS Oral Take 100 mg by mouth daily.      . MULTIVITAMINS PO CAPS Oral Take 1 capsule by mouth daily.      . TOPIRAMATE 200 MG PO TABS Oral Take 200 mg by mouth. 2 tab daily    . AMOXICILLIN-POT CLAVULANATE 500-125 MG PO TABS Oral Take 1 tablet (500 mg total) by mouth every 8 (eight) hours. 21 tablet 0  . AMOXICILLIN-POT CLAVULANATE 500-125 MG PO TABS Oral Take 1 tablet (500 mg total) by mouth every 8 (eight) hours. 30 tablet 0  . ATENOLOL 50 MG PO TABS Oral Take 50 mg by mouth daily.      Marland Kitchen  ESTRADIOL 0.75 MG/1.25 GM (0.06%) TD GEL Transdermal Place 1.25 g onto the skin daily. Patient is to apply two pumps daily 1 Bottle 12  . PREDNISONE 20 MG PO TABS Oral Take 2 tablets (40 mg total) by mouth daily. 2 tablets daily for 5 days 10 tablet 0  . ZOLPIDEM TARTRATE 10 MG PO TABS Oral Take 10 mg by mouth at bedtime as needed.        BP 125/85  Pulse 62  Temp(Src) 98.4 F (36.9 C) (Oral)  Resp 18  SpO2 100%  LMP 11/02/2000  Physical Exam  Nursing note and vitals reviewed. Constitutional: She appears well-developed and well-nourished. No distress.  HENT:  Head: Normocephalic and atraumatic.  Eyes:  Conjunctivae are normal. Right eye exhibits no discharge. Left eye exhibits no discharge.  Neck: Neck supple. No JVD present. No tracheal deviation present. No thyromegaly present.  Cardiovascular: Normal rate.   Pulmonary/Chest: Effort normal. No respiratory distress. She has no decreased breath sounds. She has no wheezes. She has no rhonchi. She has no rales. She exhibits no tenderness.  Lymphadenopathy:    She has no cervical adenopathy.    ED Course  Procedures (including critical care time)  Labs Reviewed - No data to display Dg Chest 2 View  12/09/2011  *RADIOLOGY REPORT*  Clinical Data: Coughing up yellow sputum, former smoker, chest soreness  CHEST - 2 VIEW  Comparison: 09/11/2010  Findings: Normal heart size, mediastinal contours, and pulmonary vascularity. Lungs hyperexpanded but clear. No pleural effusion or pneumothorax. Questionable 6 mm diameter nodular density lower left chest versus summation artifact of pulmonary vascular markings. Biconvex thoracolumbar scoliosis. Prior cervical spine fusion.  IMPRESSION: Hyperexpanded lungs with questionable 6 mm diameter nodule versus summation artifact left chest. Consider either repeat PA chest radiograph with a deep degree of inspiration or noncontrast CT chest to exclude pulmonary nodule.  Original Report Authenticated By: Lollie Marrow, M.D.     1. Sinusitis   2. Cough   3. Abnormal x-ray of lung       MDM  Undergoing evaluation for ongoing respiratory symptoms greater than a month with more recent sinus congestion unilateral (right maxillary region)        Jimmie Molly, MD 12/09/11 1542  Jimmie Molly, MD 12/09/11 2221

## 2011-12-09 NOTE — ED Notes (Signed)
PT HEREWITH MULTIPLE COMPLAINTS STARTING ON Monday.SX RUNNY,WATERY EYES SINUS PRESSURE AND TODAY NAUSEA,WEAKNESS AND FEELING OF VOMITING WITH DIZZINESS.SHARP INTERMITT LEFT UPPER QUAD SHARP PAIN WITH DEEP BREATHING ALSO

## 2011-12-17 ENCOUNTER — Ambulatory Visit (INDEPENDENT_AMBULATORY_CARE_PROVIDER_SITE_OTHER): Payer: No Typology Code available for payment source | Admitting: Licensed Clinical Social Worker

## 2011-12-17 DIAGNOSIS — F3189 Other bipolar disorder: Secondary | ICD-10-CM

## 2011-12-21 ENCOUNTER — Encounter: Payer: Self-pay | Admitting: Internal Medicine

## 2011-12-21 ENCOUNTER — Ambulatory Visit
Admission: RE | Admit: 2011-12-21 | Discharge: 2011-12-21 | Disposition: A | Discharge status: Home / Self Care | Payer: No Typology Code available for payment source | Source: Ambulatory Visit | Attending: Internal Medicine | Admitting: Internal Medicine

## 2011-12-21 ENCOUNTER — Ambulatory Visit (INDEPENDENT_AMBULATORY_CARE_PROVIDER_SITE_OTHER): Payer: No Typology Code available for payment source | Admitting: Internal Medicine

## 2011-12-21 ENCOUNTER — Telehealth: Payer: Self-pay | Admitting: Internal Medicine

## 2011-12-21 DIAGNOSIS — F419 Anxiety disorder, unspecified: Secondary | ICD-10-CM

## 2011-12-21 DIAGNOSIS — R05 Cough: Secondary | ICD-10-CM

## 2011-12-21 DIAGNOSIS — R9389 Abnormal findings on diagnostic imaging of other specified body structures: Secondary | ICD-10-CM

## 2011-12-21 DIAGNOSIS — R059 Cough, unspecified: Secondary | ICD-10-CM

## 2011-12-21 DIAGNOSIS — R918 Other nonspecific abnormal finding of lung field: Secondary | ICD-10-CM

## 2011-12-21 DIAGNOSIS — Z8669 Personal history of other diseases of the nervous system and sense organs: Secondary | ICD-10-CM

## 2011-12-21 DIAGNOSIS — F411 Generalized anxiety disorder: Secondary | ICD-10-CM

## 2011-12-21 NOTE — Telephone Encounter (Signed)
I need to see pt and report before I can do this. Chest X rays improve slowly so I do not know if one week is sufficient. Was it  by radiologist? She needs to find out who read it and get [place] the report and schedule an office visit please.

## 2012-01-10 ENCOUNTER — Encounter: Payer: Self-pay | Admitting: Internal Medicine

## 2012-01-10 NOTE — Patient Instructions (Signed)
Please be reassured that repeat chest x-ray shows no evidence of pulmonary nodule.

## 2012-01-10 NOTE — Progress Notes (Signed)
  Subjective:    Patient ID: [name] [name], female    DOB: 09-13-1961, 51 y.o.   MRN: [id number]  HPI 51 year old female with history of anxiety depression and carries a history of bipolar disorder. She went to urgent care recently and an x-ray was taken on site. She was told she had an abnormal x-ray with possible nodule. This frightened her. She wanted to come in today on her day off and be seen for evaluation. She has no history of chronic lung disease. No known TB exposure. She works as a Advertising copywriter. She has a history of migraine headaches. She has remarried and has a son who is in high school doing well.    Review of Systems     Objective:   Physical Exam neck supple without adenopathy; chest clear to auscultation; cardiac exam regular rate and rhythm        Assessment & Plan:  Recent abnormal chest x-ray  Plan: Repeat chest x-ray at [facility] obtaining PA and lateral views.  Patient reassured. Repeat chest x-ray shows no evidence of pulmonary nodule.

## 2012-01-19 ENCOUNTER — Ambulatory Visit (INDEPENDENT_AMBULATORY_CARE_PROVIDER_SITE_OTHER): Payer: No Typology Code available for payment source | Admitting: Licensed Clinical Social Worker

## 2012-01-19 DIAGNOSIS — F3189 Other bipolar disorder: Secondary | ICD-10-CM

## 2012-02-25 ENCOUNTER — Ambulatory Visit (INDEPENDENT_AMBULATORY_CARE_PROVIDER_SITE_OTHER): Payer: No Typology Code available for payment source | Admitting: Licensed Clinical Social Worker

## 2012-02-25 DIAGNOSIS — F3189 Other bipolar disorder: Secondary | ICD-10-CM

## 2012-03-24 ENCOUNTER — Ambulatory Visit (INDEPENDENT_AMBULATORY_CARE_PROVIDER_SITE_OTHER): Payer: No Typology Code available for payment source | Admitting: Licensed Clinical Social Worker

## 2012-03-24 DIAGNOSIS — F3189 Other bipolar disorder: Secondary | ICD-10-CM

## 2012-04-07 ENCOUNTER — Ambulatory Visit: Payer: No Typology Code available for payment source | Admitting: Licensed Clinical Social Worker

## 2012-04-14 ENCOUNTER — Other Ambulatory Visit: Payer: Self-pay

## 2012-04-14 MED ORDER — HYDROCODONE-ACETAMINOPHEN 10-650 MG PO TABS: 1.0000 | ORAL_TABLET | Freq: Four times a day (QID) | ORAL | Status: AC | PRN

## 2012-05-19 ENCOUNTER — Ambulatory Visit (INDEPENDENT_AMBULATORY_CARE_PROVIDER_SITE_OTHER): Payer: No Typology Code available for payment source | Admitting: Licensed Clinical Social Worker

## 2012-05-19 DIAGNOSIS — F3189 Other bipolar disorder: Secondary | ICD-10-CM

## 2012-06-13 ENCOUNTER — Inpatient Hospital Stay (HOSPITAL_COMMUNITY): Payer: No Typology Code available for payment source

## 2012-06-13 ENCOUNTER — Emergency Department (HOSPITAL_COMMUNITY)
Admission: EM | Admit: 2012-06-13 | Discharge: 2012-06-13 | Disposition: A | Discharge status: Other Acute Inpatient Hospital | Payer: No Typology Code available for payment source | Source: Home / Self Care | Attending: Emergency Medicine | Admitting: Emergency Medicine

## 2012-06-13 ENCOUNTER — Encounter (HOSPITAL_COMMUNITY): Payer: Self-pay | Admitting: Internal Medicine

## 2012-06-13 ENCOUNTER — Emergency Department (HOSPITAL_COMMUNITY): Payer: No Typology Code available for payment source

## 2012-06-13 ENCOUNTER — Encounter (HOSPITAL_COMMUNITY): Payer: Self-pay

## 2012-06-13 ENCOUNTER — Other Ambulatory Visit: Payer: Self-pay | Admitting: Family Medicine

## 2012-06-13 ENCOUNTER — Inpatient Hospital Stay (HOSPITAL_COMMUNITY)
Admission: EM | Admit: 2012-06-13 | Discharge: 2012-06-14 | DRG: 313 | Disposition: A | Discharge status: Home / Self Care | Payer: No Typology Code available for payment source | Attending: Internal Medicine | Admitting: Internal Medicine

## 2012-06-13 DIAGNOSIS — Z90710 Acquired absence of both cervix and uterus: Secondary | ICD-10-CM

## 2012-06-13 DIAGNOSIS — Z981 Arthrodesis status: Secondary | ICD-10-CM

## 2012-06-13 DIAGNOSIS — F319 Bipolar disorder, unspecified: Secondary | ICD-10-CM | POA: Diagnosis present

## 2012-06-13 DIAGNOSIS — R079 Chest pain, unspecified: Secondary | ICD-10-CM

## 2012-06-13 DIAGNOSIS — R1032 Left lower quadrant pain: Secondary | ICD-10-CM

## 2012-06-13 DIAGNOSIS — A048 Other specified bacterial intestinal infections: Secondary | ICD-10-CM

## 2012-06-13 DIAGNOSIS — R042 Hemoptysis: Secondary | ICD-10-CM | POA: Diagnosis not present

## 2012-06-13 DIAGNOSIS — F419 Anxiety disorder, unspecified: Secondary | ICD-10-CM

## 2012-06-13 DIAGNOSIS — F411 Generalized anxiety disorder: Secondary | ICD-10-CM | POA: Diagnosis present

## 2012-06-13 DIAGNOSIS — I498 Other specified cardiac arrhythmias: Secondary | ICD-10-CM | POA: Diagnosis present

## 2012-06-13 DIAGNOSIS — M549 Dorsalgia, unspecified: Secondary | ICD-10-CM | POA: Diagnosis present

## 2012-06-13 DIAGNOSIS — R0789 Other chest pain: Principal | ICD-10-CM | POA: Diagnosis present

## 2012-06-13 DIAGNOSIS — I208 Other forms of angina pectoris: Secondary | ICD-10-CM

## 2012-06-13 DIAGNOSIS — G43909 Migraine, unspecified, not intractable, without status migrainosus: Secondary | ICD-10-CM | POA: Diagnosis present

## 2012-06-13 DIAGNOSIS — F17201 Nicotine dependence, unspecified, in remission: Secondary | ICD-10-CM

## 2012-06-13 DIAGNOSIS — D696 Thrombocytopenia, unspecified: Secondary | ICD-10-CM | POA: Diagnosis present

## 2012-06-13 DIAGNOSIS — R1012 Left upper quadrant pain: Secondary | ICD-10-CM

## 2012-06-13 DIAGNOSIS — Z87891 Personal history of nicotine dependence: Secondary | ICD-10-CM

## 2012-06-13 DIAGNOSIS — R0781 Pleurodynia: Secondary | ICD-10-CM | POA: Diagnosis present

## 2012-06-13 DIAGNOSIS — I2089 Other forms of angina pectoris: Secondary | ICD-10-CM

## 2012-06-13 DIAGNOSIS — Z8249 Family history of ischemic heart disease and other diseases of the circulatory system: Secondary | ICD-10-CM

## 2012-06-13 DIAGNOSIS — R001 Bradycardia, unspecified: Secondary | ICD-10-CM

## 2012-06-13 DIAGNOSIS — A09 Infectious gastroenteritis and colitis, unspecified: Secondary | ICD-10-CM

## 2012-06-13 DIAGNOSIS — R1084 Generalized abdominal pain: Secondary | ICD-10-CM

## 2012-06-13 LAB — CBC WITH DIFFERENTIAL/PLATELET
Basophils Relative: 0 % (ref 0–1)
Eosinophils Absolute: 0.1 10*3/uL (ref 0.0–0.7)
Eosinophils Relative: 2 % (ref 0–5)
HCT: 33.7 % — ABNORMAL LOW (ref 36.0–46.0)
Hemoglobin: 11.6 g/dL — ABNORMAL LOW (ref 12.0–15.0)
Lymphs Abs: 1.9 10*3/uL (ref 0.7–4.0)
MCH: 30.7 pg (ref 26.0–34.0)
MCHC: 34.4 g/dL (ref 30.0–36.0)
MCV: 89.2 fL (ref 78.0–100.0)
Monocytes Absolute: 0.4 10*3/uL (ref 0.1–1.0)
Monocytes Relative: 8 % (ref 3–12)
RBC: 3.78 MIL/uL — ABNORMAL LOW (ref 3.87–5.11)

## 2012-06-13 LAB — BASIC METABOLIC PANEL
BUN: 12 mg/dL (ref 6–23)
Calcium: 8.9 mg/dL (ref 8.4–10.5)
Creatinine, Ser: 0.81 mg/dL (ref 0.50–1.10)
GFR calc Af Amer: >90 mL/min (ref >90)
GFR calc non Af Amer: 83 mL/min — ABNORMAL LOW (ref >90)
Glucose, Bld: 85 mg/dL (ref 70–99)
Potassium: 3.9 mEq/L (ref 3.5–5.1)

## 2012-06-13 LAB — CARDIAC PANEL(CRET KIN+CKTOT+MB+TROPI): Relative Index: INVALID (ref 0.0–2.5)

## 2012-06-13 LAB — LIPID PANEL
Cholesterol: 185 mg/dL (ref 0–200)
Total CHOL/HDL Ratio: 4 RATIO

## 2012-06-13 LAB — HEPATIC FUNCTION PANEL
ALT: 7 U/L (ref 0–35)
AST: 14 U/L (ref 0–37)
Albumin: 3.4 g/dL — ABNORMAL LOW (ref 3.5–5.2)
Alkaline Phosphatase: 34 U/L — ABNORMAL LOW (ref 39–117)
Total Protein: 6.4 g/dL (ref 6.0–8.3)

## 2012-06-13 LAB — MAGNESIUM: Magnesium: 2.1 mg/dL (ref 1.5–2.5)

## 2012-06-13 LAB — PHOSPHORUS: Phosphorus: 3 mg/dL (ref 2.3–4.6)

## 2012-06-13 MED ORDER — HYDROCODONE-ACETAMINOPHEN 5-325 MG PO TABS: 1.0000 | ORAL_TABLET | ORAL | Status: DC | PRN

## 2012-06-13 MED ORDER — ACETAMINOPHEN 650 MG RE SUPP: 650.0000 mg | Freq: Four times a day (QID) | RECTAL | Status: DC | PRN

## 2012-06-13 MED ORDER — ACETAMINOPHEN 325 MG PO TABS
650.0000 mg | ORAL_TABLET | Freq: Once | ORAL | Status: DC
  Filled 2012-06-13: qty 2

## 2012-06-13 MED ORDER — NITROGLYCERIN 0.4 MG SL SUBL
SUBLINGUAL_TABLET | SUBLINGUAL | Status: AC
  Filled 2012-06-13: qty 25

## 2012-06-13 MED ORDER — IOHEXOL 350 MG/ML SOLN
100.0000 mL | Freq: Once | INTRAVENOUS | Status: AC | PRN
  Administered 2012-06-13: 100 mL via INTRAVENOUS

## 2012-06-13 MED ORDER — ACETAMINOPHEN 325 MG PO TABS
650.0000 mg | ORAL_TABLET | Freq: Four times a day (QID) | ORAL | Status: DC | PRN
Start: 2012-06-13 — End: 2012-06-14

## 2012-06-13 MED ORDER — SODIUM CHLORIDE 0.9 % IV SOLN
INTRAVENOUS | Status: DC
  Administered 2012-06-13 (×2): via INTRAVENOUS

## 2012-06-13 MED ORDER — ASPIRIN EC 81 MG PO TBEC
81.0000 mg | DELAYED_RELEASE_TABLET | Freq: Every day | ORAL | Status: DC
  Administered 2012-06-14: 81 mg via ORAL
  Filled 2012-06-13: qty 1

## 2012-06-13 MED ORDER — NITROGLYCERIN IN D5W 200-5 MCG/ML-% IV SOLN
5.0000 ug/min | INTRAVENOUS | Status: DC
  Administered 2012-06-13: 5 ug/min via INTRAVENOUS
  Filled 2012-06-13: qty 250

## 2012-06-13 MED ORDER — ALPRAZOLAM 0.5 MG PO TABS
0.5000 mg | ORAL_TABLET | Freq: Three times a day (TID) | ORAL | Status: DC
  Administered 2012-06-14 (×2): 0.5 mg via ORAL
  Filled 2012-06-13 (×2): qty 1

## 2012-06-13 MED ORDER — MORPHINE SULFATE 2 MG/ML IJ SOLN
1.0000 mg | INTRAMUSCULAR | Status: DC | PRN
  Administered 2012-06-13 – 2012-06-14 (×2): 1 mg via INTRAVENOUS
  Filled 2012-06-13 (×2): qty 1

## 2012-06-13 MED ORDER — ONDANSETRON HCL 4 MG/2ML IJ SOLN
4.0000 mg | Freq: Once | INTRAMUSCULAR | Status: AC
  Administered 2012-06-13: 4 mg via INTRAVENOUS
  Filled 2012-06-13: qty 2

## 2012-06-13 MED ORDER — HEPARIN BOLUS VIA INFUSION
4000.0000 [IU] | Freq: Once | INTRAVENOUS | Status: AC
  Administered 2012-06-13: 4000 [IU] via INTRAVENOUS
  Filled 2012-06-13 (×2): qty 4000

## 2012-06-13 MED ORDER — DOCUSATE SODIUM 100 MG PO CAPS
100.0000 mg | ORAL_CAPSULE | Freq: Two times a day (BID) | ORAL | Status: DC
  Administered 2012-06-13 – 2012-06-14 (×2): 100 mg via ORAL
  Filled 2012-06-13 (×3): qty 1

## 2012-06-13 MED ORDER — HEPARIN (PORCINE) IN NACL 100-0.45 UNIT/ML-% IJ SOLN
1000.0000 [IU]/h | INTRAMUSCULAR | Status: DC
  Administered 2012-06-13: 1000 [IU]/h via INTRAVENOUS
  Filled 2012-06-13 (×2): qty 250

## 2012-06-13 MED ORDER — ASPIRIN 81 MG PO CHEW
CHEWABLE_TABLET | ORAL | Status: AC
  Filled 2012-06-13: qty 4

## 2012-06-13 MED ORDER — REGADENOSON 0.4 MG/5ML IV SOLN
0.4000 mg | Freq: Once | INTRAVENOUS | Status: AC
  Administered 2012-06-14: 0.4 mg via INTRAVENOUS
  Filled 2012-06-13: qty 5

## 2012-06-13 MED ORDER — DIPHENHYDRAMINE HCL 50 MG/ML IJ SOLN
25.0000 mg | Freq: Once | INTRAMUSCULAR | Status: AC
  Administered 2012-06-13: 25 mg via INTRAVENOUS
  Filled 2012-06-13: qty 1

## 2012-06-13 MED ORDER — SODIUM CHLORIDE 0.9 % IV SOLN: INTRAVENOUS | Status: DC

## 2012-06-13 MED ORDER — KETOROLAC TROMETHAMINE 30 MG/ML IJ SOLN
30.0000 mg | Freq: Once | INTRAMUSCULAR | Status: AC
  Administered 2012-06-13: 30 mg via INTRAVENOUS
  Filled 2012-06-13: qty 1

## 2012-06-13 MED ORDER — NITROGLYCERIN 0.4 MG SL SUBL: 0.4000 mg | SUBLINGUAL_TABLET | SUBLINGUAL | Status: DC | PRN

## 2012-06-13 MED ORDER — PANTOPRAZOLE SODIUM 40 MG IV SOLR
40.0000 mg | Freq: Two times a day (BID) | INTRAVENOUS | Status: DC
  Administered 2012-06-13: 40 mg via INTRAVENOUS
  Filled 2012-06-13 (×3): qty 40

## 2012-06-13 MED ORDER — METOCLOPRAMIDE HCL 5 MG/ML IJ SOLN
10.0000 mg | Freq: Once | INTRAMUSCULAR | Status: AC
  Administered 2012-06-13: 10 mg via INTRAVENOUS
  Filled 2012-06-13: qty 2

## 2012-06-13 MED ORDER — LAMOTRIGINE 100 MG PO TABS
100.0000 mg | ORAL_TABLET | Freq: Two times a day (BID) | ORAL | Status: DC
  Administered 2012-06-13 – 2012-06-14 (×2): 150 mg via ORAL
  Filled 2012-06-13 (×3): qty 1.5

## 2012-06-13 MED ORDER — SODIUM CHLORIDE 0.9 % IJ SOLN: 3.0000 mL | Freq: Two times a day (BID) | INTRAMUSCULAR | Status: DC

## 2012-06-13 MED ORDER — NITROGLYCERIN 0.4 MG SL SUBL
0.4000 mg | SUBLINGUAL_TABLET | SUBLINGUAL | Status: DC | PRN
  Administered 2012-06-13: 0.4 mg via SUBLINGUAL

## 2012-06-13 MED ORDER — ASPIRIN 81 MG PO CHEW
324.0000 mg | CHEWABLE_TABLET | Freq: Once | ORAL | Status: AC
  Administered 2012-06-13: 324 mg via ORAL

## 2012-06-13 MED ORDER — HYDROMORPHONE HCL PF 1 MG/ML IJ SOLN
0.5000 mg | Freq: Once | INTRAMUSCULAR | Status: AC
  Administered 2012-06-13: 0.5 mg via INTRAVENOUS
  Filled 2012-06-13: qty 1

## 2012-06-13 NOTE — ED Notes (Signed)
Pt retruned from c-t no iv tubing and no saline lok tubing attached to the rt hand angiocath bleeding angiocath capped and removed contamionated

## 2012-06-13 NOTE — ED Notes (Signed)
Pt waiting for bed assignment.  Chest pain still  there

## 2012-06-13 NOTE — H&P (Signed)
[facility] History and Physical  [name] [name] RUE:[id number] DOB: [birth year] DOA: 06/13/2012   PCP: [name], MD   Chief Complaint: Chest pain.   HPI:  51 year old with PMH significant for Migraine, Bipolar, no history of hypertension, no  hyperlipidemia, no history of diabetes who presents to ED complaining of chest pain that started yesterday while she was vacuum. She describes pain as sharp, stabbing, middle chest, worse with deep breath, constant. No relieved by nitroglycerin. No aggravated by food. She denies chest pain or dyspnea on exertion. She relates some nausea, no vomiting. No improvement of chest pain with nitroglycerin Gtt.  She does relates coughing blood, small amount over last 6 month. The last time she cough was last week.  She has been on a lot of stress, her daughter has placenta previa. She has been very anxious and worried for her daughter health.  She denies Lower extremities edema.  She is also complaining of headache, started yesterday, different from her migraine.   Review of Systems:  Constitutional:  No weight loss, night sweats, Fevers, chills, fatigue.  HEENT:  No Difficulty swallowing,Tooth/dental problems,Sore throat,  No sneezing, itching, ear ache, nasal congestion, post nasal drip,  Cardio-vascular:  No chest pain, Orthopnea, PND, swelling in lower extremities, anasarca, dizziness, palpitations  GI:  No heartburn, indigestion, abdominal pain, nausea, vomiting, diarrhea, change in bowel habits, loss of appetite  Resp:  No shortness of breath with exertion or at rest. No chest wall deformity  Skin:  no rash or lesions.  GU:  no dysuria, change in color of urine, no urgency or frequency. No flank pain.  Musculoskeletal:  No joint pain or swelling. No decreased range of motion. No back pain.  Psych:  No change in mood or affect. No depression . No memory loss.    Past Medical History  Diagnosis Date  . Bipolar disorder   .  Migraine   . Helicobacter pylori (H. pylori) 07/2010  . Anxiety    Past Surgical History  Procedure Date  . Cervical fusion   . Tubal ligation   . Upper gastrointestinal endoscopy   . Abdominal hysterectomy 2001    USO by history-sono2011 question of both ovaries present   Social History: She has never used smokeless tobacco. She quit smoking more than 10 years ago. She reports that she does not drink alcohol or use illicit drugs. She is married.   No Known Allergies  Family History  Problem Relation Age of Onset  . Hypertension Mother   . Hypertension Brother     Prior to Admission medications   Medication Sig Start Date End Date Taking? Authorizing Provider  ALPRAZolam Prudy Feeler) 0.5 MG tablet Take 0.5 mg by mouth 3 (three) times daily.     Yes Historical Provider, MD  IRON PO Take 1 tablet by mouth daily.   Yes Historical Provider, MD  lamoTRIgine (LAMICTAL) 100 MG tablet Take 100-150 mg by mouth 2 (two) times daily. Take 1 tablet every morning and take 1.5 tablets at night.   Yes Historical Provider, MD  Multiple Vitamin (MULTIVITAMIN) capsule Take 1 capsule by mouth daily.     Yes Historical Provider, MD  topiramate (TOPAMAX) 200 MG tablet Take 400 mg by mouth daily.    Yes Historical Provider, MD   Physical Exam: Filed Vitals:   06/13/12 1318 06/13/12 1453 06/13/12 1533 06/13/12 1651  BP: 109/69 109/72 103/68 90/50  Pulse: 52 18 61 58  Temp: 98.4 F (36.9 C)  98.4 F (36.9 C)   TempSrc: Oral  Oral   Resp: 19 18 20    SpO2: 100% 97% 100%    BP 90/50  Pulse 58  Temp 98.4 F (36.9 C) (Oral)  Resp 20  SpO2 100%  LMP 11/02/2000  General Appearance:    Alert, cooperative, no distress, appears stated age  Head:    Normocephalic, without obvious abnormality, atraumatic  Eyes:    PERRL, conjunctiva/corneas clear, EOM's intact,     Ears:    Normal TM's and external ear canals, both ears  Nose:   Nares normal, septum midline, mucosa normal, no drainage    or sinus  tenderness  Throat:   Lips, mucosa, and tongue normal; teeth and gums normal  Neck:   Supple, symmetrical, trachea midline, no adenopathy;    thyroid:  no enlargement/tenderness/nodules; no carotid   bruit or JVD  Back:     Symmetric, no curvature, ROM normal, no CVA tenderness  Lungs:     Clear to auscultation bilaterally, respirations unlabored  Chest Wall:    No tenderness or deformity   Heart:    Regular rate and rhythm, S1 and S2 normal, no murmur, rub   or gallop     Abdomen:     Soft, non-tender, bowel sounds active all four quadrants,    no masses, no organomegaly        Extremities:   Extremities normal, atraumatic, no cyanosis or edema  Pulses:   2+ and symmetric all extremities  Skin:   Skin color, texture, turgor normal, no rashes or lesions  Lymph nodes:   Cervical, supraclavicular, and axillary nodes normal  Neurologic:   CNII-XII intact, normal strength, sensation and reflexes    throughout    Labs on Admission:  Basic Metabolic Panel:  Lab 06/13/12 1610  NA 142  K 3.9  CL 112  CO2 23  GLUCOSE 85  BUN 12  CREATININE 0.81  CALCIUM 8.9  MG --  PHOS --   CBC:  Lab 06/13/12 1402  WBC 5.2  NEUTROABS 2.9  HGB 11.6*  HCT 33.7*  MCV 89.2  PLT 167   Cardiac Enzymes: No results found for this basename: CKTOTAL:5,CKMB:5,CKMBINDEX:5,TROPONINI:5 in the last 168 hours BNP: No components found with this basename: POCBNP:5 CBG: No results found for this basename: GLUCAP:5 in the last 168 hours  Radiological Exams on Admission: Dg Chest 2 View  06/13/2012  *RADIOLOGY REPORT*  Clinical Data: Chest pain  CHEST - 2 VIEW  Comparison: 12/21/2011  Findings: Cardiomediastinal silhouette is stable.  Metallic fixation plate cervical spine again noted.  No acute infiltrate or pleural effusion.  No pulmonary edema.  IMPRESSION: No active disease.  No significant change.  Original Report Authenticated By: Natasha Mead, M.D.    EKG: Non specific T wave abnormalities.    Assessment/Plan:   1-Chest pain, atypical: Admit to rule out ACS. Her chest pain is atypical. First troponin negative, no significant risk factors no hyperlipidemia, no diabetes, no HTN. She denies chest pain on exertion. For this reason I will hold Nitroglycerin Gtt. No improvement of chest pain with nitroglycerin. I will stop for now heparin Gtt due to Hemoptysis. I will check CT angio if positive for PE will resume heparin. Cardio Corinda Gubler will arrange stress test tomorrow.    2-Bipolar disorder: Continue with Lamictal.   3-Migraine: Hold Topamax due to chest pain. IV morphine for pain as needed. Advised patient not to take aleve for pain.   4-Cough with intermittant hemoptysis:  CT angio will be able to help asses for malignancy.   5-Bradycardia: Asymptomatic. Check TSH.    Time spend: 45 minutes.  Code Status: Full Code. Family Communication: husband at bedside plan discussed with him.  Disposition Plan: Admit to Rule out ACS.   Hartley Barefoot, MD  [facility] Pager [phone]  If 7PM-7AM, please contact night-coverage [email or web address] Password [facility] 06/13/2012, 5:23 PM

## 2012-06-13 NOTE — ED Provider Notes (Signed)
History     CSN: [id number]  Arrival date & time 06/13/12  1315   First MD Initiated Contact with Patient 06/13/12 1351      Chief Complaint  Patient presents with  . Chest Pain    (Consider location/radiation/quality/duration/timing/severity/associated sxs/prior treatment) HPI Comments: 51 y/o female presents with sudden onset chest pain radiating to her back yesterday afternoon at 4:00 while vacuuming. Describes the pain as sharp with a tight feeling in her chest. Pain somewhat relieved at rest. She continued to have intermittent chest pain on exertion throughout the night into today and went to [facility] who sent her to ED for further cardiac workup. Admits to associated nausea and diaphoresis. Also has a headache. Denies sob, lightheadedness, dizziness, or vomiting. She is a non smoker. Denies any history of heart problems. Unsure of family hx due to being adopted. [facility] gave her aspirin and nitro which gave her some symptom relief. She has never had chest pain like this before.   The history is provided by the patient.    Past Medical History  Diagnosis Date  . Bipolar disorder   . Migraine   . Helicobacter pylori (H. pylori) 07/2010  . Anxiety     Past Surgical History  Procedure Date  . Cervical fusion   . Tubal ligation   . Upper gastrointestinal endoscopy   . Abdominal hysterectomy 2001    USO by history-sono2011 question of both ovaries present    Family History  Problem Relation Age of Onset  . Hypertension Mother   . Hypertension Brother     History  Substance Use Topics  . Smoking status: Never Smoker   . Smokeless tobacco: Never Used  . Alcohol Use: No    OB History    Grav Para Term Preterm Abortions TAB SAB Ect Mult Living   4 3   1  1   3       Review of Systems  Constitutional: Positive for diaphoresis.  Respiratory: Positive for chest tightness. Negative for shortness of breath.   Cardiovascular: Positive for chest pain.  Gastrointestinal:  Positive for nausea. Negative for vomiting.  Neurological: Positive for headaches. Negative for dizziness and light-headedness.    Allergies  Review of patient's allergies indicates no known allergies.  Home Medications   Current Outpatient Rx  Name Route Sig Dispense Refill  . ALPRAZOLAM 0.5 MG PO TABS Oral Take 0.5 mg by mouth 3 (three) times daily.      . IRON PO Oral Take 1 tablet by mouth daily.    Marland Kitchen LAMOTRIGINE 100 MG PO TABS Oral Take 100-150 mg by mouth 2 (two) times daily. Take 1 tablet every morning and take 1.5 tablets at night.    . MULTIVITAMINS PO CAPS Oral Take 1 capsule by mouth daily.      . TOPIRAMATE 200 MG PO TABS Oral Take 400 mg by mouth daily.       BP 109/69  Pulse 52  Temp 98.4 F (36.9 C) (Oral)  Resp 19  SpO2 100%  LMP 11/02/2000  Physical Exam  Nursing note and vitals reviewed. Constitutional: She is oriented to person, place, and time. She appears well-developed and well-nourished. No distress.  HENT:  Head: Normocephalic and atraumatic.  Mouth/Throat: Oropharynx is clear and moist.  Eyes: Conjunctivae are normal.  Neck: Neck supple. No JVD present.  Cardiovascular: Normal rate, regular rhythm, normal heart sounds and intact distal pulses.        No extremity edema  Pulmonary/Chest: Effort normal  and breath sounds normal. She has no wheezes. She has no rhonchi. She has no rales.  Abdominal: Soft. Bowel sounds are normal. There is no tenderness.  Neurological: She is alert and oriented to person, place, and time.  Skin: Skin is warm and dry. She is not diaphoretic.  Psychiatric: She has a normal mood and affect. Her speech is normal and behavior is normal.    ED Course  Procedures (including critical care time)   Labs Reviewed  CBC WITH DIFFERENTIAL  BASIC METABOLIC PANEL   Results for orders placed during the hospital encounter of 06/13/12  CBC WITH DIFFERENTIAL      Component Value Range   WBC 5.2  4.0 - 10.5 K/uL   RBC 3.78 (*)  3.87 - 5.11 MIL/uL   Hemoglobin 11.6 (*) 12.0 - 15.0 g/dL   HCT 16.1 (*) 09.6 - 04.5 %   MCV 89.2  78.0 - 100.0 fL   MCH 30.7  26.0 - 34.0 pg   MCHC 34.4  30.0 - 36.0 g/dL   RDW 40.9  81.1 - 91.4 %   Platelets 167  150 - 400 K/uL   Neutrophils Relative 55  43 - 77 %   Neutro Abs 2.9  1.7 - 7.7 K/uL   Lymphocytes Relative 35  12 - 46 %   Lymphs Abs 1.9  0.7 - 4.0 K/uL   Monocytes Relative 8  3 - 12 %   Monocytes Absolute 0.4  0.1 - 1.0 K/uL   Eosinophils Relative 2  0 - 5 %   Eosinophils Absolute 0.1  0.0 - 0.7 K/uL   Basophils Relative 0  0 - 1 %   Basophils Absolute 0.0  0.0 - 0.1 K/uL  BASIC METABOLIC PANEL      Component Value Range   Sodium 142  135 - 145 mEq/L   Potassium 3.9  3.5 - 5.1 mEq/L   Chloride 112  96 - 112 mEq/L   CO2 23  19 - 32 mEq/L   Glucose, Bld 85  70 - 99 mg/dL   BUN 12  6 - 23 mg/dL   Creatinine, Ser 7.82  0.50 - 1.10 mg/dL   Calcium 8.9  8.4 - 95.6 mg/dL   GFR calc non Af Amer 83 (*) >90 mL/min   GFR calc Af Amer >90  >90 mL/min  POCT I-STAT TROPONIN I      Component Value Range   Troponin i, poc 0.01  0.00 - 0.08 ng/mL   Comment 3             Dg Chest 2 View  06/13/2012  *RADIOLOGY REPORT*  Clinical Data: Chest pain  CHEST - 2 VIEW  Comparison: 12/21/2011  Findings: Cardiomediastinal silhouette is stable.  Metallic fixation plate cervical spine again noted.  No acute infiltrate or pleural effusion.  No pulmonary edema.  IMPRESSION: No active disease.  No significant change.  Original Report Authenticated By: Natasha Mead, M.D.    Date: 06/13/2012  Rate: 50  Rhythm: normal sinus rhythm  QRS Axis: normal  Intervals: normal  ST/T Wave abnormalities: nonspecific T wave changes  Conduction Disutrbances:none  Narrative Interpretation: no stemi  Old EKG Reviewed: unchanged    No diagnosis found. Dx- unstabile angina, chest pain   MDM  51 y/o female with anginal symptoms- will obtain labs, cxr, and begin nitro and heparin drip. ECG not showing  stemi. 3:12 PM Patient's pain slightly decreased after nitro drip and dilaudid. Still with a tightness feeling.  Very nauseated. Will give zofran. Case discussed with Dr. Weldon Inches. Will admit patient for further evaluation of unstabile angina.        Trevor Mace, PA-C 06/13/12 1513

## 2012-06-13 NOTE — ED Provider Notes (Addendum)
Medical screening examination/treatment/procedure(s) were conducted as a shared visit with non-physician practitioner(s) and myself.  I personally evaluated the patient during the encounter She has no significant pmh.  Nonsmoker.  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   No fever, chills, n/v. Leg pain or swelling.  pe normal.  Concern for angina.  Will check ecg, labs, and admit.   Cheri Guppy, MD 06/13/12 1519  ECG. Sinus bradycardia at 50 beats per minute. Normal axis. Normal intervals. Nonspecific T-wave changes  Cheri Guppy, MD 06/13/12 1523

## 2012-06-13 NOTE — ED Notes (Signed)
[facility] called back and will change the pt to a stepdown bed and [place more orders for pain control per katherine schor fnp for [facility]

## 2012-06-13 NOTE — ED Notes (Signed)
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

## 2012-06-13 NOTE — ED Notes (Signed)
nss added to iv in the lt a-c.

## 2012-06-13 NOTE — ED Notes (Signed)
The pt reports her headache is only a little better and her chest pain continues  At the same intensity.  Very drowsy from the headache medicine given

## 2012-06-13 NOTE — ED Notes (Signed)
Admitting doctor at the bedside asking for the heparin and nitro drips be discontinued.  She will order more meds for the headache.   She sustains a 5/10 chest pain even on the nitro drip

## 2012-06-13 NOTE — ED Notes (Signed)
The pt returned from c-t still has chest pain. And she also still has a headache

## 2012-06-13 NOTE — ED Notes (Signed)
Patient given 1 NTG SL for c/o chest pain, states CP remains same , c/o HA post admin NTG

## 2012-06-13 NOTE — ED Provider Notes (Signed)
Medical screening examination/treatment/procedure(s) were conducted as a shared visit with non-physician practitioner(s) and myself.  I personally evaluated the patient during the encounter  Meriah Shands, MD 06/13/12 1601 

## 2012-06-13 NOTE — ED Notes (Signed)
Tylenol ordered for her headache the pt refused it.  She reports that she  Has a migraine headache and  Morphine is the only med that helps.  She was given dilaudid earlier and that not work either.  Another admitting doctor is seeing the pt now.

## 2012-06-13 NOTE — ED Notes (Signed)
Food given 

## 2012-06-13 NOTE — ED Notes (Signed)
The pt is c/o chest pain and a headache.  She was given med for a headache but that has not helped.  zofran given for nausea

## 2012-06-13 NOTE — ED Notes (Signed)
From [facility] via [facility].  Substernal chest pain started yesterday associated N denies vomiting associated diapharesis.  Non specific Twave changes, sinus brady in 50's per [facility].  No cardiac history, has history of bi-polar, anxiety, migraines.  NKDA, Pt given 324 apririn  1 sublingual nitro, pt has 20 in rt hand saline at [facility].  Last pressure 100 systolic

## 2012-06-13 NOTE — ED Notes (Signed)
Patients urine has been collected and is by the bedside. 

## 2012-06-13 NOTE — ED Notes (Signed)
Admitting doctor here to see.  Chest pain is better headache remains

## 2012-06-13 NOTE — ED Notes (Signed)
The pt has been assigned a tele bed from a stepdown.  The pt is not pain free and has not been pain free  Even on the nitro drip when it was running.  That has been discontinued.

## 2012-06-13 NOTE — Progress Notes (Signed)
Disposition Note  [name] [name], is a 51 y.o. female,   MRN: [id number]  -  DOB - [birth year]  Outpatient Primary MD for the patient is [name], MD   Blood pressure 103/68, pulse 61, temperature 98.4 F (36.9 C), temperature source Oral, resp. rate 20, last menstrual period 11/02/2000, SpO2 100.00%.  Principal Problem:  *Chest pain, atypical Active Problems:  Tobacco abuse, in remission  Pleuritic pain  Cough with intermittant hemoptysis  Bipolar disorder  Migraine  Back pain   Female patient with out any significant past medical history from a cardiovascular standpoint. She does have a history of anxiety and bipolar disorder and migraines. Presents to the ER with complaint of acute chest pain onset yesterday while working at her job. She works as a Advertising copywriter and was noticing chest pain that was associated with shortness of breath and some nausea but no diaphoresis occurred while vacuuming. Patient had similar episode of chest pain one year ago and apparently was evaluated in the ER and sent home. She and or she did not undergo stress testing. She describes her current chest pain as being in her mid chest and very tight nonradiating and associated with a poking sensation over her left anterior chest. Currently she is chest pain-free and she is on low dose IV nitroglycerin. She lived in foster care so does not know about her family medical history. She also has a history of daily mucoid expectoration occasionally blood-tinged for greater than one year. She has mentioned this to her primary care physician but has not found a definitive answer as to why she continues with this problem. She has a remote history of tobacco abuse. She also describes intermittent back pain without any precipitating factors. Her EKG in the ER was nonischemic.No cardiac isoenzymes are back. The attending ER physician was concerned about unstable angina therefore he did start her on IV nitroglycerin as  mentioned. At this time because of intermittent chest pain we will go ahead and admit her to the step down unit. I have notified the ER physician and need for holding orders in bed status. I've also notified Regina in flow management of need for step down bed. I discussed this patient with Dr. Sunnie Nielsen who will be admitting this patient. Since the patient has both typical and atypical features for chest pain it is hopeful we can wean the nitroglycerin and change her bed status to telemetry.  Junious Silk, ANP

## 2012-06-13 NOTE — ED Notes (Signed)
Pain med given  And c-t called requesting a # 20 a-c iv.  Iv placed in the rt a-c

## 2012-06-13 NOTE — ED Notes (Signed)
To c-t for angio of the chest

## 2012-06-13 NOTE — ED Provider Notes (Addendum)
History     CSN: [id number]  Arrival date & time 06/13/12  1137   First MD Initiated Contact with Patient 06/13/12 1146      Chief Complaint  Patient presents with  . Chest Pain    (Consider location/radiation/quality/duration/timing/severity/associated sxs/prior treatment) HPI Comments: Patient presents urgent care this morning with chest pains associated with mild sweating brief episodes of dizziness and nausea. Symptoms started last night at rest have persisted overnight with various degrees of intensity is from 4-6. Described as pressure type, pain seems to exacerbate with activity but is also present at rest. Patient did not sleep well overnight she continued perceiving this pain. She did not take anything for it. She called her doctor's office this morning describing her chest pain and as she describes she was told to come to urgent care to be evaluated for a heart problem"  Patient denies any respiratory symptoms such as shortness of breath, cough or upper congestion. She also denies any abdominal pain.  Patient is a 51 y.o. female presenting with chest pain. The history is provided by the patient.  Chest Pain The chest pain began 12 - 24 hours ago. Chest pain occurs constantly. At its most intense, the pain is at 6/10. The severity of the pain is moderate. The quality of the pain is described as aching and pressure-like. Primary symptoms include nausea. Pertinent negatives for primary symptoms include no fever, no fatigue, no shortness of breath, no cough, no palpitations, no vomiting, no dizziness and no altered mental status.  Associated symptoms include diaphoresis.  Pertinent negatives for associated symptoms include no numbness and no weakness.     Past Medical History  Diagnosis Date  . Bipolar disorder   . Migraine   . Helicobacter pylori (H. pylori) 07/2010  . Anxiety     Past Surgical History  Procedure Date  . Cervical fusion   . Tubal ligation   . Upper  gastrointestinal endoscopy   . Abdominal hysterectomy 2001    USO by history-sono2011 question of both ovaries present    Family History  Problem Relation Age of Onset  . Hypertension Mother   . Hypertension Brother     History  Substance Use Topics  . Smoking status: Never Smoker   . Smokeless tobacco: Never Used  . Alcohol Use: No    OB History    Grav Para Term Preterm Abortions TAB SAB Ect Mult Living   4 3   1  1   3       Review of Systems  Constitutional: Positive for diaphoresis and activity change. Negative for fever and fatigue.  Respiratory: Negative for apnea, cough, choking and shortness of breath.   Cardiovascular: Positive for chest pain. Negative for palpitations and leg swelling.  Gastrointestinal: Positive for nausea. Negative for vomiting.  Skin: Negative for rash.  Neurological: Negative for dizziness, weakness and numbness.  Psychiatric/Behavioral: Negative for altered mental status.    Allergies  Review of patient's allergies indicates no known allergies.  Home Medications   Current Outpatient Rx  Name Route Sig Dispense Refill  . ALPRAZOLAM 0.5 MG PO TABS Oral Take 0.5 mg by mouth 3 (three) times daily.      . ATENOLOL 50 MG PO TABS Oral Take 50 mg by mouth daily.      Marland Kitchen ESTRADIOL 0.75 MG/1.25 GM (0.06%) TD GEL Transdermal Place 1.25 g onto the skin daily. Patient is to apply two pumps daily 1 Bottle 12  . LAMOTRIGINE 100 MG PO  TABS Oral Take 100 mg by mouth daily.      . MULTIVITAMINS PO CAPS Oral Take 1 capsule by mouth daily.      . TOPIRAMATE 200 MG PO TABS Oral Take 200 mg by mouth. 2 tab daily    . ZOLPIDEM TARTRATE 10 MG PO TABS Oral Take 10 mg by mouth at bedtime as needed.        BP 113/77  Pulse 51  Temp 98.2 F (36.8 C) (Oral)  Resp 12  SpO2 100%  LMP 11/02/2000  Physical Exam  Nursing note and vitals reviewed. Constitutional: She appears well-developed and well-nourished. No distress.  HENT:  Head: Normocephalic.  Eyes:  Conjunctivae are normal.  Cardiovascular: Regular rhythm and normal pulses.  Bradycardia present.  Exam reveals friction rub. Exam reveals no gallop.   No murmur heard. Pulmonary/Chest: Effort normal and breath sounds normal.    ED Course  Procedures (including critical care time)  Labs Reviewed - No data to display No results found.   1. Chest pain at rest     EKG at the 12 hours Ventricular rate of 50 beats per minute, sinus bradycardia nonspecific T-wave abnormalities. No QRS or QT interval abnormalities. No ST changes or Q waves  MDM  51 year old female with precordial and substernal pain with radiation to her back. Patient is normotensive and stable with symptomatology and nonspecific T changes on her initial EKG. Patient has been transferred to the emergency department for further evaluation and monitoring. For chest pain protocol. We have proceeded to administer aspirin, and any dose of sublingual nitroglycerin to evaluate treatment response.        Jimmie Molly, MD 06/13/12 1231  Jimmie Molly, MD 06/13/12 1239

## 2012-06-14 ENCOUNTER — Inpatient Hospital Stay (HOSPITAL_COMMUNITY): Payer: No Typology Code available for payment source

## 2012-06-14 ENCOUNTER — Encounter (HOSPITAL_COMMUNITY): Payer: Self-pay | Admitting: *Deleted

## 2012-06-14 DIAGNOSIS — R079 Chest pain, unspecified: Secondary | ICD-10-CM

## 2012-06-14 LAB — TSH: TSH: 3.015 u[IU]/mL (ref 0.350–4.500)

## 2012-06-14 LAB — CARDIAC PANEL(CRET KIN+CKTOT+MB+TROPI)
CK, MB: 2.3 ng/mL (ref 0.3–4.0)
Relative Index: INVALID (ref 0.0–2.5)
Relative Index: INVALID (ref 0.0–2.5)
Total CK: 76 U/L (ref 7–177)
Troponin I: <0.3 ng/mL (ref <0.30)
Troponin I: <0.3 ng/mL (ref <0.30)

## 2012-06-14 LAB — BASIC METABOLIC PANEL
Calcium: 8.5 mg/dL (ref 8.4–10.5)
GFR calc non Af Amer: 80 mL/min — ABNORMAL LOW (ref >90)
Sodium: 142 mEq/L (ref 135–145)

## 2012-06-14 LAB — CBC
Platelets: 147 10*3/uL — ABNORMAL LOW (ref 150–400)
RBC: 3.57 MIL/uL — ABNORMAL LOW (ref 3.87–5.11)
WBC: 5.4 10*3/uL (ref 4.0–10.5)

## 2012-06-14 MED ORDER — TECHNETIUM TC 99M TETROFOSMIN IV KIT
10.0000 | PACK | Freq: Once | INTRAVENOUS | Status: AC | PRN
  Administered 2012-06-14: 10 via INTRAVENOUS

## 2012-06-14 MED ORDER — PERPHENAZINE 4 MG PO TABS
4.0000 mg | ORAL_TABLET | Freq: Two times a day (BID) | ORAL | Status: DC | PRN
  Filled 2012-06-14: qty 2

## 2012-06-14 MED ORDER — TECHNETIUM TC 99M TETROFOSMIN IV KIT
30.0000 | PACK | Freq: Once | INTRAVENOUS | Status: AC | PRN
  Administered 2012-06-14: 30 via INTRAVENOUS

## 2012-06-14 MED ORDER — PANTOPRAZOLE SODIUM 20 MG PO TBEC: 20.0000 mg | DELAYED_RELEASE_TABLET | Freq: Every day | ORAL | Status: DC

## 2012-06-14 MED ORDER — REGADENOSON 0.4 MG/5ML IV SOLN
INTRAVENOUS | Status: AC
  Administered 2012-06-14: 0.4 mg via INTRAVENOUS
  Filled 2012-06-14: qty 5

## 2012-06-14 MED ORDER — PANTOPRAZOLE SODIUM 40 MG PO TBEC
40.0000 mg | DELAYED_RELEASE_TABLET | Freq: Every day | ORAL | Status: DC
  Administered 2012-06-14: 40 mg via ORAL
  Filled 2012-06-14: qty 1

## 2012-06-14 NOTE — Progress Notes (Signed)
Reviewed discharge instructions with patient and family. No questions at present. Pt to be escorted out via wheelchair.   Maygen Sirico, Charlaine Dalton RN

## 2012-06-14 NOTE — Progress Notes (Signed)
Lexiscan MV performed Bjorn Loser Quintez Maselli 06/14/2012 11:20 AM

## 2012-06-14 NOTE — Progress Notes (Signed)
  Echocardiogram 2D Echocardiogram has been performed.  Jorje Guild 06/14/2012, 9:38 AM

## 2012-06-14 NOTE — Progress Notes (Signed)
Pt states that her chest is a little tight. MD notified and 1mg  morphine given. Pt allowed to travel off tele for the procedure. Will continue to monitor.  Damel Querry, Charlaine Dalton RN

## 2012-06-15 NOTE — Discharge Summary (Signed)
Physician Discharge Summary  [name] [name] ZHY:[id number] DOB: 1961-10-15 DOA: 06/13/2012  PCP: [name], MD  Admit date: 06/13/2012 Discharge date: 06/15/2012  Recommendations for Outpatient Follow-up:  1. F/u with PCP for chronic cough 2. Mild thrombocytopenia noted on second CBC- can f/u as oupt  Discharge Diagnoses:  Principal Problem:  *Chest pain, atypical Active Problems:  Bipolar disorder  Migraine  Pleuritic pain  Tobacco abuse, in remission  Cough with intermittant hemoptysis  Back pain   Discharge Condition: stable  Diet recommendation: heart healthy, low fat  Filed Weights   06/14/12 0500  Weight: 58 kg (127 lb 13.9 oz)    History of present illness:  51 year old with PMH significant for Migraine, Bipolar disorder who presents to ED complaining of chest pain that started yesterday while she was vacuum. She describes pain as sharp, stabbing, middle chest, worse with deep breath, constant. No relieved by nitroglycerin. No aggravated by food. She denies chest pain or dyspnea on exertion. She relates some nausea, no vomiting. No improvement of chest pain with nitroglycerin Gtt.  She does relates coughing blood, small amount over last 6 month. The last time she coughed was last week.  She has been on a lot of stress, her daughter has placenta previa. She has been very anxious and worried for her daughter health.  She denies Lower extremities edema.  She is also complaining of headache, started yesterday, different from her migraine.     Hospital Course:   Chest pain- atypical Ruled out with 3 sets of cardiac enzymes- CT chest negative for PE-  Myoview stress test and ECHO are noted below-  negative. Pain is reproducible on exam and present on the left upper chest. I suspect it may musculoskeletal- she denies symptoms of GERD or gastritis. Pain improves with Morphine.   Chronic Cough Has been about 6 months of cough with grayish sputum, sometimes blood  tinged.  Will refer back to PCP for this- it may be a low level, smoldering bronchitis. She has never been treated with antibiotics for it. CT chest did not reveal any etiology for the cough. No sinus drainage or sore throat -  She smoked about 10 yrs ago - no recent exposure to second had smoke. 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   Bradycardia Asymptomatic- TSH normal  Hyperlipidemia- mild LDL 127, Cholesterol 185 Low fat diet advised.  Thrombocytopenia Also mild- 147- f/u as outpt  Discharge Exam: Filed Vitals:   06/14/12 1545  BP: 100/63  Pulse: 66  Temp: 98.7 F (37.1 C)  Resp: 18   Filed Vitals:   06/14/12 1121 06/14/12 1123 06/14/12 1300 06/14/12 1545  BP: 111/67 111/70 113/67 100/63  Pulse:   71 66  Temp:    98.7 F (37.1 C)  TempSrc:    Oral  Resp:   20 18  Height:      Weight:      SpO2:   100% 97%    General: alert and in no acute distress Cardiovascular: RRR, no murmurs Respiratory: CTA b/l   Discharge Instructions  Discharge Orders    Future Appointments: Provider: Department: Dept Phone: Center:   06/16/2012 12:00 PM [name], MD [facility] [phone]     Future Orders Please Complete By Expires   Diet - low sodium heart healthy      Increase activity slowly        Medication List  As of 06/15/2012  3:11 PM   TAKE these medications         ALPRAZolam 0.5 MG tablet   Commonly known as: XANAX   Take 0.5 mg by mouth 3 (three) times daily.      IRON PO   Take 1 tablet by mouth daily.      lamoTRIgine 100 MG tablet   Commonly known as: LAMICTAL   Take 100-150 mg by mouth 2 (two) times daily. Take 1 tablet every morning and take 1.5 tablets at night.      multivitamin capsule   Take 1 capsule by mouth daily.      topiramate 200 MG tablet   Commonly known as: TOPAMAX   Take 400 mg by mouth daily.              The results of significant diagnostics from this  hospitalization (including imaging, microbiology, ancillary and laboratory) are listed below for reference.    Significant Diagnostic Studies:   2D ECHO Left ventricle: The cavity size was normal. Systolic function was normal. The estimated ejection fraction was in the range of 55% to 60%. Wall motion was normal; there were no regional wall motion abnormalities. Left ventricular diastolic function parameters were normal.   Dg Chest 2 View  06/13/2012  *RADIOLOGY REPORT*  Clinical Data: Chest pain  CHEST - 2 VIEW  Comparison: 12/21/2011  Findings: Cardiomediastinal silhouette is stable.  Metallic fixation plate cervical spine again noted.  No acute infiltrate or pleural effusion.  No pulmonary edema.  IMPRESSION: No active disease.  No significant change.  Original Report Authenticated By: Natasha Mead, M.D.   Ct Angio Chest Pe W/cm &/or Wo Cm  06/13/2012  *RADIOLOGY REPORT*  Clinical Data: Shortness of breath and chest pain for 2 days.  CT ANGIOGRAPHY CHEST  Technique:  Multidetector CT imaging of the chest using the standard protocol during bolus administration of intravenous contrast. Multiplanar reconstructed images including MIPs were obtained and reviewed to evaluate the vascular anatomy.  Contrast: OMNIPAQUE IOHEXOL 350 MG/ML SOLN  Comparison: Plain films of the chest earlier this same date.  Findings: No pulmonary embolus is identified.  The patient has an aberrant right subclavian artery with a prominent infundibulum at its origin measuring 2.4 cm in diameter.  Heart size is upper normal.  There is no axillary, hilar mediastinal lymphadenopathy. No pleural or pericardial effusion.  The lungs are clear. Incidentally imaged upper abdomen demonstrates a low attenuating lesion in the left lobe most consistent with a cyst.  Imaged intra- abdominal contents are otherwise unremarkable.  There is no focal bony abnormality.  IMPRESSION:  1.  Negative for pulmonary embolus or acute cardiopulmonary  disease. 2.  Aberrant right subclavian artery.  Original Report Authenticated By: Bernadene Bell. Maricela Curet, M.D.   Nm Myocar Multi W/spect W/wall Motion / Ef  06/14/2012  Lexiscan Myovue:  Indication: Chest Pain  The patient received .4 mg of lexiscan as a bolus.  HR was stable at 53  bpm.  BP stable at  118/68 mmHg.  ECG was stable with no changes.  No symptoms and baseline ECG with nonspecific ST/T wave changes  The images were reconstructed in the vetical , short axis and horizontal views.  Surface Images were normal with no RWMA;s and EF =   74 %  QPS: Normal with no reversibility  Impression:  Normal lexiscan myovue EF 74% with no ischemia or infarction  Charlton Haws MD [facility]  Original Report Authenticated By: Marita Snellen  Microbiology: No results found for this or any previous visit (from the past 240 hour(s)).   Labs: Basic Metabolic Panel:  Lab 06/14/12 1610 06/13/12 2232 06/13/12 1402  NA 142 -- 142  K 3.9 -- 3.9  CL 115* -- 112  CO2 22 -- 23  GLUCOSE 90 -- 85  BUN 13 -- 12  CREATININE 0.84 -- 0.81  CALCIUM 8.5 -- 8.9  MG -- 2.1 --  PHOS -- 3.0 --   Liver Function Tests:  Lab 06/13/12 1753  AST 14  ALT 7  ALKPHOS 34*  BILITOT 0.3  PROT 6.4  ALBUMIN 3.4*   No results found for this basename: LIPASE:5,AMYLASE:5 in the last 168 hours No results found for this basename: AMMONIA:5 in the last 168 hours CBC:  Lab 06/14/12 0234 06/13/12 1402  WBC 5.4 5.2  NEUTROABS -- 2.9  HGB 11.0* 11.6*  HCT 32.3* 33.7*  MCV 90.5 89.2  PLT 147* 167   Cardiac Enzymes:  Lab 06/14/12 0915 06/14/12 0234 06/13/12 1751  CKTOTAL 78 76 85  CKMB 2.1 2.3 2.1  CKMBINDEX -- -- --  TROPONINI <0.30 <0.30 <0.30   BNP: BNP (last 3 results) No results found for this basename: PROBNP:3 in the last 8760 hours CBG: No results found for this basename: GLUCAP:5 in the last 168 hours  Time coordinating discharge: 35  minutes  Signed:  Ainhoa Rallo  [facility] 06/15/2012, 3:11 PM

## 2012-06-16 ENCOUNTER — Ambulatory Visit: Payer: No Typology Code available for payment source | Admitting: Internal Medicine

## 2012-06-16 ENCOUNTER — Telehealth: Payer: Self-pay | Admitting: Internal Medicine

## 2012-06-16 NOTE — Telephone Encounter (Signed)
Patient had physical exam scheduled today which it on the book for some time. She's recently been hospitalized with chest pain and chronic cough and was discharged from hospital yesterday. MI was ruled out. She called today to cancel physical exam appointment and therefore hospital followup.

## 2012-08-01 ENCOUNTER — Ambulatory Visit: Payer: No Typology Code available for payment source | Admitting: Licensed Clinical Social Worker

## 2012-08-16 ENCOUNTER — Ambulatory Visit (INDEPENDENT_AMBULATORY_CARE_PROVIDER_SITE_OTHER): Payer: No Typology Code available for payment source | Admitting: Licensed Clinical Social Worker

## 2012-08-16 DIAGNOSIS — F3189 Other bipolar disorder: Secondary | ICD-10-CM

## 2012-08-24 ENCOUNTER — Other Ambulatory Visit: Payer: Self-pay | Admitting: Internal Medicine

## 2012-08-24 DIAGNOSIS — Z1231 Encounter for screening mammogram for malignant neoplasm of breast: Secondary | ICD-10-CM

## 2012-08-29 ENCOUNTER — Encounter: Payer: Self-pay | Admitting: Gynecology

## 2012-08-29 ENCOUNTER — Ambulatory Visit (INDEPENDENT_AMBULATORY_CARE_PROVIDER_SITE_OTHER): Payer: No Typology Code available for payment source | Admitting: Gynecology

## 2012-08-29 VITALS — BP 106/68 | Ht 60.0 in | Wt 125.0 lb

## 2012-08-29 DIAGNOSIS — N951 Menopausal and female climacteric states: Secondary | ICD-10-CM

## 2012-08-29 DIAGNOSIS — Z01419 Encounter for gynecological examination (general) (routine) without abnormal findings: Secondary | ICD-10-CM

## 2012-08-29 MED ORDER — ESTRADIOL 0.1 MG/24HR TD PTTW: 1.0000 | MEDICATED_PATCH | TRANSDERMAL | Status: DC

## 2012-08-29 NOTE — Patient Instructions (Addendum)

## 2012-08-29 NOTE — Progress Notes (Signed)
[name] [name] 08/20/1961 [id number]        51 y.o.  G4P0013 for annual exam.  Several issues noted below.  Past medical history,surgical history, medications, allergies, family history and social history were all reviewed and documented in the EPIC chart. ROS:  Was performed and pertinent positives and negatives are included in the history.  Exam: [name] assistant Filed Vitals:   08/29/12 1540  BP: 106/68  Height: 5' (1.524 m)  Weight: 125 lb (56.7 kg)   General appearance  Normal Skin grossly normal Head/Neck normal with no cervical or supraclavicular adenopathy thyroid normal Lungs  clear Cardiac RR, without RMG Abdominal  soft, nontender, without masses, organomegaly or hernia Breasts  examined lying and sitting without masses, retractions, discharge or axillary adenopathy. Pelvic  Ext/BUS/vagina  normal with mild atrophic changes  Adnexa  Without masses or tenderness    Anus and perineum  normal   Rectovaginal  normal sphincter tone without palpated masses or tenderness.    Assessment/Plan:  51 y.o. G61P0013 female for annual exam.   1. Menopausal symptoms. Patient has switched from Vivelle-Dot 0.1 mg patch to Estrogel last year but did not like it and stopped it. Sternum hot flashes and sleep disturbances and wants to restart the patch. I again reviewed the risks to include stroke heart attack DVT, possible breast cancer issues. Patient understands and accepts I refilled her Minivelle 0.1 mg patches times a year. 2. Mammography. Patient has scheduled in November and will follow up for this. SBE monthly reviewed. 3. Pap smear.  No Pap smear done today. Last Pap smear 06/2011. Patient is status post hysterectomy without history of significant abnormal Pap smears. Options to stop altogether or less frequent screening reviewed and we'll readdress this on an annual basis. 4. DEXA. We'll plan further into the 50s.  Increase calcium vitamin D reviewed. 5. Colonoscopy. Patient had  2011. We'll follow up with the recommended interval. 6. Health maintenance. No blood work done today as she has this through her primary physician's office. Follow up one year assuming she does well with ERT, sooner as needed.   [name], 4:21 PM 08/29/2012

## 2012-08-30 ENCOUNTER — Encounter: Payer: Self-pay | Admitting: Gynecology

## 2012-09-08 ENCOUNTER — Ambulatory Visit: Payer: No Typology Code available for payment source | Admitting: Licensed Clinical Social Worker

## 2012-09-26 ENCOUNTER — Ambulatory Visit: Payer: No Typology Code available for payment source

## 2012-10-11 ENCOUNTER — Ambulatory Visit (INDEPENDENT_AMBULATORY_CARE_PROVIDER_SITE_OTHER): Payer: No Typology Code available for payment source | Admitting: Licensed Clinical Social Worker

## 2012-10-11 DIAGNOSIS — F3189 Other bipolar disorder: Secondary | ICD-10-CM

## 2012-10-17 ENCOUNTER — Ambulatory Visit
Admission: RE | Admit: 2012-10-17 | Discharge: 2012-10-17 | Disposition: A | Discharge status: Home / Self Care | Payer: No Typology Code available for payment source | Source: Ambulatory Visit | Attending: Internal Medicine | Admitting: Internal Medicine

## 2012-10-17 DIAGNOSIS — Z1231 Encounter for screening mammogram for malignant neoplasm of breast: Secondary | ICD-10-CM

## 2012-11-17 ENCOUNTER — Ambulatory Visit (INDEPENDENT_AMBULATORY_CARE_PROVIDER_SITE_OTHER): Payer: No Typology Code available for payment source | Admitting: Licensed Clinical Social Worker

## 2012-11-17 DIAGNOSIS — F3189 Other bipolar disorder: Secondary | ICD-10-CM

## 2012-12-05 ENCOUNTER — Ambulatory Visit (INDEPENDENT_AMBULATORY_CARE_PROVIDER_SITE_OTHER): Payer: No Typology Code available for payment source | Admitting: Licensed Clinical Social Worker

## 2012-12-05 DIAGNOSIS — F3189 Other bipolar disorder: Secondary | ICD-10-CM

## 2012-12-17 ENCOUNTER — Other Ambulatory Visit: Payer: Self-pay

## 2013-01-19 ENCOUNTER — Emergency Department (INDEPENDENT_AMBULATORY_CARE_PROVIDER_SITE_OTHER): Payer: No Typology Code available for payment source

## 2013-01-19 ENCOUNTER — Encounter (HOSPITAL_COMMUNITY): Payer: Self-pay | Admitting: *Deleted

## 2013-01-19 ENCOUNTER — Emergency Department (INDEPENDENT_AMBULATORY_CARE_PROVIDER_SITE_OTHER)
Admission: EM | Admit: 2013-01-19 | Discharge: 2013-01-19 | Disposition: A | Discharge status: Home / Self Care | Payer: Self-pay | Source: Home / Self Care | Attending: Family Medicine | Admitting: Family Medicine

## 2013-01-19 DIAGNOSIS — S139XXA Sprain of joints and ligaments of unspecified parts of neck, initial encounter: Secondary | ICD-10-CM

## 2013-01-19 MED ORDER — CYCLOBENZAPRINE HCL 10 MG PO TABS: 10.0000 mg | ORAL_TABLET | Freq: Two times a day (BID) | ORAL | Status: DC | PRN

## 2013-01-19 MED ORDER — IBUPROFEN 600 MG PO TABS: 600.0000 mg | ORAL_TABLET | Freq: Three times a day (TID) | ORAL | Status: DC | PRN
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
## 2013-02-09 ENCOUNTER — Ambulatory Visit (INDEPENDENT_AMBULATORY_CARE_PROVIDER_SITE_OTHER): Payer: No Typology Code available for payment source | Admitting: Internal Medicine

## 2013-02-09 ENCOUNTER — Encounter: Payer: Self-pay | Admitting: Internal Medicine

## 2013-02-09 ENCOUNTER — Ambulatory Visit
Admission: RE | Admit: 2013-02-09 | Discharge: 2013-02-09 | Disposition: A | Discharge status: Home / Self Care | Payer: Self-pay | Source: Ambulatory Visit | Attending: Internal Medicine | Admitting: Internal Medicine

## 2013-02-09 VITALS — BP 114/78 | HR 80 | Temp 98.2°F | Wt 125.0 lb

## 2013-02-09 DIAGNOSIS — M545 Low back pain, unspecified: Secondary | ICD-10-CM

## 2013-02-09 DIAGNOSIS — R109 Unspecified abdominal pain: Secondary | ICD-10-CM

## 2013-02-09 DIAGNOSIS — M549 Dorsalgia, unspecified: Secondary | ICD-10-CM

## 2013-02-09 DIAGNOSIS — R071 Chest pain on breathing: Secondary | ICD-10-CM

## 2013-02-09 DIAGNOSIS — R0789 Other chest pain: Secondary | ICD-10-CM

## 2013-03-03 ENCOUNTER — Ambulatory Visit: Payer: Self-pay | Admitting: Licensed Clinical Social Worker

## 2013-03-09 ENCOUNTER — Ambulatory Visit (INDEPENDENT_AMBULATORY_CARE_PROVIDER_SITE_OTHER): Payer: PRIVATE HEALTH INSURANCE | Admitting: Licensed Clinical Social Worker

## 2013-03-09 DIAGNOSIS — F3189 Other bipolar disorder: Secondary | ICD-10-CM

## 2013-03-13 ENCOUNTER — Other Ambulatory Visit: Payer: Self-pay

## 2013-03-13 MED ORDER — HYDROCODONE-ACETAMINOPHEN 5-325 MG PO TABS: 1.0000 | ORAL_TABLET | Freq: Two times a day (BID) | ORAL | Status: DC | PRN

## 2013-04-20 ENCOUNTER — Ambulatory Visit (INDEPENDENT_AMBULATORY_CARE_PROVIDER_SITE_OTHER): Payer: PRIVATE HEALTH INSURANCE | Admitting: Licensed Clinical Social Worker

## 2013-04-20 DIAGNOSIS — F3189 Other bipolar disorder: Secondary | ICD-10-CM

## 2013-05-18 ENCOUNTER — Ambulatory Visit (INDEPENDENT_AMBULATORY_CARE_PROVIDER_SITE_OTHER): Payer: PRIVATE HEALTH INSURANCE | Admitting: Licensed Clinical Social Worker

## 2013-05-18 DIAGNOSIS — F3189 Other bipolar disorder: Secondary | ICD-10-CM

## 2013-06-15 ENCOUNTER — Ambulatory Visit (INDEPENDENT_AMBULATORY_CARE_PROVIDER_SITE_OTHER): Payer: PRIVATE HEALTH INSURANCE | Admitting: Licensed Clinical Social Worker

## 2013-06-15 DIAGNOSIS — F3189 Other bipolar disorder: Secondary | ICD-10-CM

## 2013-06-30 ENCOUNTER — Other Ambulatory Visit: Payer: Self-pay

## 2013-06-30 MED ORDER — HYDROCODONE-ACETAMINOPHEN 5-325 MG PO TABS: 1.0000 | ORAL_TABLET | Freq: Two times a day (BID) | ORAL | Status: DC | PRN

## 2013-07-07 ENCOUNTER — Ambulatory Visit (INDEPENDENT_AMBULATORY_CARE_PROVIDER_SITE_OTHER): Payer: No Typology Code available for payment source | Admitting: Internal Medicine

## 2013-07-07 ENCOUNTER — Encounter: Payer: Self-pay | Admitting: Internal Medicine

## 2013-07-07 VITALS — BP 98/74 | HR 76 | Wt 122.0 lb

## 2013-07-07 DIAGNOSIS — K5900 Constipation, unspecified: Secondary | ICD-10-CM

## 2013-07-07 DIAGNOSIS — K645 Perianal venous thrombosis: Secondary | ICD-10-CM

## 2013-07-07 NOTE — Progress Notes (Signed)
  Subjective:    Patient ID: [name] [name], female    DOB: 1961-03-19, 52 y.o.   MRN: [id number]  HPI  52 year old White female in today complaining of constipation since April. She doesn't know why started that month. She does take Norco 5/325 sometimes for migraine headaches. However has not complained of constipation previously. She has had an extensive GI workup by Dr. Loreta Ave and also Dr. Juanda Chance and Dr [name] for abdominal pain. She had colonoscopy by Dr. Loreta Ave 04/02/2010. Colonoscopy was normal up to the terminal ileum.  She had an upper endoscopy by Dr. [name] 07/08/2010 that showed gastritis. She was found to have H. pylori and was treated appropriately. She has a history of bipolar disorder.  She is also on Topamax, Xanax, Abilify, and is also on lamotrigine. My suspicion is all of these medications in addition to Norco contribute to constipation. Also see where she was recently prescribed Ambien and perphenazine. She has tried stool softeners without relief. Sometimes when she is so uncomfortable, she will take a laxative and will get relief. No rectal bleeding. However has developed a thrombosed hemorrhoid.  52 year old She said she took one bottle of MiraLAX and did not see any relief. She was taking it as directed on a daily basis.    Review of Systems     Objective:   Physical Exam HEENT exam: TMs and pharynx are clear. Neck is supple without thyromegaly. Chest clear. Abdomen no hepatosplenomegaly masses or tenderness. Abdomen is soft nondistended. Hard balls of stool in rectal vault. Small thrombosed hemorrhoid at 10:00 position. It is not bleeding. No blood in rectal vault.       Assessment & Plan:  Constipation-likely related to medications  Thrombosed external hemorrhoid- should resolve  with sitz baths  Plan: Today patient will take 2 Senokot S. tablets for relief of constipation. Patient is to increase MiraLAX to 2 doses daily instead of one dose daily. She will let me know how  she's doing within 2 weeks. If this does not work, we can try Amitiza. I did check TSH today.

## 2013-07-07 NOTE — Patient Instructions (Addendum)
Increase MiraLAX to 2 doses daily. Take 2 Senokot S tablets tonight for constipation. Call if not better in 2 weeks. Thyroid functions checked. Sitz baths for hemorrhoids

## 2013-07-10 NOTE — Progress Notes (Signed)
Patient informed. 

## 2013-07-27 ENCOUNTER — Ambulatory Visit (INDEPENDENT_AMBULATORY_CARE_PROVIDER_SITE_OTHER): Payer: PRIVATE HEALTH INSURANCE | Admitting: Licensed Clinical Social Worker

## 2013-07-27 DIAGNOSIS — F3189 Other bipolar disorder: Secondary | ICD-10-CM
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
## 2013-08-02 NOTE — Patient Instructions (Addendum)
Continue ibuprofen and Flexeril as previously prescribed. Take hydrocodone/APAP if needed for severe pain. Consider physical therapy if symptoms do not improve/resolve within 2-3 weeks

## 2013-08-10 ENCOUNTER — Ambulatory Visit (INDEPENDENT_AMBULATORY_CARE_PROVIDER_SITE_OTHER): Payer: PRIVATE HEALTH INSURANCE | Admitting: Licensed Clinical Social Worker

## 2013-08-10 DIAGNOSIS — F3189 Other bipolar disorder: Secondary | ICD-10-CM

## 2013-09-07 ENCOUNTER — Ambulatory Visit (INDEPENDENT_AMBULATORY_CARE_PROVIDER_SITE_OTHER): Payer: PRIVATE HEALTH INSURANCE | Admitting: Licensed Clinical Social Worker

## 2013-09-07 DIAGNOSIS — F3189 Other bipolar disorder: Secondary | ICD-10-CM

## 2013-09-11 ENCOUNTER — Encounter: Payer: No Typology Code available for payment source | Admitting: Gynecology

## 2013-09-18 ENCOUNTER — Ambulatory Visit (INDEPENDENT_AMBULATORY_CARE_PROVIDER_SITE_OTHER): Payer: No Typology Code available for payment source | Admitting: Internal Medicine

## 2013-09-18 ENCOUNTER — Encounter: Payer: Self-pay | Admitting: Internal Medicine

## 2013-09-18 VITALS — BP 108/74 | HR 64 | Temp 96.6°F | Ht 60.0 in | Wt 128.0 lb

## 2013-09-18 DIAGNOSIS — J069 Acute upper respiratory infection, unspecified: Secondary | ICD-10-CM

## 2013-09-18 DIAGNOSIS — R5381 Other malaise: Secondary | ICD-10-CM

## 2013-09-18 LAB — COMPREHENSIVE METABOLIC PANEL
AST: 17 U/L (ref 0–37)
Albumin: 4.3 g/dL (ref 3.5–5.2)
Alkaline Phosphatase: 30 U/L — ABNORMAL LOW (ref 39–117)
BUN: 14 mg/dL (ref 6–23)
Creat: 0.85 mg/dL (ref 0.50–1.10)
Potassium: 3.6 mEq/L (ref 3.5–5.3)
Total Bilirubin: 0.4 mg/dL (ref 0.3–1.2)

## 2013-09-18 LAB — CBC WITH DIFFERENTIAL/PLATELET
Eosinophils Absolute: 0.1 10*3/uL (ref 0.0–0.7)
Eosinophils Relative: 2 % (ref 0–5)
Hemoglobin: 13.3 g/dL (ref 12.0–15.0)
Lymphs Abs: 2.2 10*3/uL (ref 0.7–4.0)
MCH: 31.5 pg (ref 26.0–34.0)
MCV: 89.6 fL (ref 78.0–100.0)
Monocytes Relative: 7 % (ref 3–12)
Neutrophils Relative %: 48 % (ref 43–77)
RBC: 4.22 MIL/uL (ref 3.87–5.11)

## 2013-09-18 LAB — IRON AND TIBC
%SAT: 40 % (ref 20–55)
Iron: 109 ug/dL (ref 42–145)

## 2013-09-18 LAB — T4, FREE: Free T4: 1.03 ng/dL (ref 0.80–1.80)

## 2013-09-21 ENCOUNTER — Other Ambulatory Visit: Payer: Self-pay | Admitting: Gynecology

## 2013-09-21 ENCOUNTER — Telehealth: Payer: Self-pay | Admitting: *Deleted

## 2013-09-21 NOTE — Telephone Encounter (Signed)
Has CE scheduled 10/16/13.

## 2013-09-21 NOTE — Telephone Encounter (Signed)
Pt sample of minivelle 0.1mg  patch. 1 pack given to pt. Pt annual scheduled next month

## 2013-10-10 ENCOUNTER — Other Ambulatory Visit: Payer: Self-pay | Admitting: *Deleted

## 2013-10-10 MED ORDER — HYDROCODONE-ACETAMINOPHEN 5-325 MG PO TABS: 1.0000 | ORAL_TABLET | Freq: Two times a day (BID) | ORAL | Status: DC | PRN

## 2013-10-11 ENCOUNTER — Other Ambulatory Visit: Payer: Self-pay

## 2013-10-11 DIAGNOSIS — Z1231 Encounter for screening mammogram for malignant neoplasm of breast: Secondary | ICD-10-CM

## 2013-10-16 ENCOUNTER — Ambulatory Visit (INDEPENDENT_AMBULATORY_CARE_PROVIDER_SITE_OTHER): Payer: No Typology Code available for payment source | Admitting: Gynecology

## 2013-10-16 ENCOUNTER — Encounter: Payer: Self-pay | Admitting: Gynecology

## 2013-10-16 VITALS — BP 114/76 | Ht 60.0 in | Wt 125.2 lb

## 2013-10-16 DIAGNOSIS — Z01419 Encounter for gynecological examination (general) (routine) without abnormal findings: Secondary | ICD-10-CM

## 2013-10-16 DIAGNOSIS — Z79890 Hormone replacement therapy (postmenopausal): Secondary | ICD-10-CM

## 2013-10-16 LAB — URINALYSIS W MICROSCOPIC + REFLEX CULTURE
Bilirubin Urine: NEGATIVE
Casts: NONE SEEN
Crystals: NONE SEEN
Glucose, UA: NEGATIVE mg/dL
Ketones, ur: NEGATIVE mg/dL
RBC / HPF: NONE SEEN RBC/hpf (ref <3)
Specific Gravity, Urine: 1.019 (ref 1.005–1.030)
WBC, UA: NONE SEEN WBC/hpf (ref <3)
pH: 7 (ref 5.0–8.0)

## 2013-10-16 MED ORDER — ESTRADIOL 0.1 MG/24HR TD PTTW: MEDICATED_PATCH | TRANSDERMAL | Status: DC

## 2013-10-16 NOTE — Patient Instructions (Signed)
Follow up in one year, sooner as needed. 

## 2013-10-16 NOTE — Progress Notes (Signed)
[name] [name] 29-May-1961 [id number]        52 y.o.  G4P0013 for annual exam.  Doing well.  Past medical history,surgical history, problem list, medications, allergies, family history and social history were all reviewed and documented in the EPIC chart.  ROS:  Performed and pertinent positives and negatives are included in the history, assessment and plan .  Exam: [name] assistant Filed Vitals:   10/16/13 0842  BP: 114/76  Height: 5' (1.524 m)  Weight: 125 lb 3.2 oz (56.79 kg)   General appearance  Normal Skin grossly normal with raised skin mole right upper anterior chest. Head/Neck normal with no cervical or supraclavicular adenopathy thyroid normal Lungs  clear Cardiac RR, without RMG Abdominal  soft, nontender, without masses, organomegaly or hernia Breasts  examined lying and sitting without masses, retractions, discharge or axillary adenopathy. Pelvic  Ext/BUS/vagina  Normal   Adnexa  Without masses or tenderness    Anus and perineum  Normal   Rectovaginal  Normal sphincter tone without palpated masses or tenderness.    Assessment/Plan:  52 y.o. G72P0013 female for annual exam.   1. Menopausal symptoms/ERT/history of TAH. Doing well on Minivelle 0.1 mg patches and wants to continue. I again reviewed the risks benefits to include the WHI study and risks of stroke heart attack DVT and breast cancer. Issues about weaning at some point also reviewed. Patient strongly wants to continue which I think is reasonable and I refilled her patches x1 year. 2. Mole right anterior chest. Patient never followed up to have it excised as discussed previously. It has remained unchanged both to my exam and to the patient's exam. Patient has decided just to monitor it and if it does change at all then she is to followup with dermatology to have it excised. Disclaimer cannot guarantee benign reviewed. 3. Pap smear 2012. No Pap smear done today. Status post hysterectomy in the past with no  history of abnormal Pap smears. Options to stop screening altogether or less frequent screening intervals reviewed. Will readdress on an annual basis. 4. Mammography scheduled and patient knows to followup for this. SBE monthly reviewed. 5. Colonoscopy 2011. Followup at their recommended interval. 6. DEXA never. We'll plan closer to 60 as she is on HRT. Increase calcium vitamin D reviewed. 7. Health maintenance. No blood work done as she reports having this done through her primary physician's office. Followup one year, sooner as needed.   Note: This document was prepared with digital dictation and possible smart phrase technology. Any transcriptional errors that result from this process are unintentional.   [name], 9:05 AM 10/16/2013

## 2013-10-19 ENCOUNTER — Ambulatory Visit (INDEPENDENT_AMBULATORY_CARE_PROVIDER_SITE_OTHER): Payer: PRIVATE HEALTH INSURANCE | Admitting: Licensed Clinical Social Worker

## 2013-10-19 DIAGNOSIS — F3189 Other bipolar disorder: Secondary | ICD-10-CM

## 2013-11-06 ENCOUNTER — Ambulatory Visit: Payer: PRIVATE HEALTH INSURANCE

## 2013-11-06 ENCOUNTER — Ambulatory Visit
Admission: RE | Admit: 2013-11-06 | Discharge: 2013-11-06 | Disposition: A | Discharge status: Home / Self Care | Payer: No Typology Code available for payment source | Source: Ambulatory Visit

## 2013-11-06 DIAGNOSIS — Z1231 Encounter for screening mammogram for malignant neoplasm of breast: Secondary | ICD-10-CM

## 2013-11-30 ENCOUNTER — Ambulatory Visit (INDEPENDENT_AMBULATORY_CARE_PROVIDER_SITE_OTHER): Payer: No Typology Code available for payment source | Admitting: Licensed Clinical Social Worker

## 2013-11-30 DIAGNOSIS — F3189 Other bipolar disorder: Secondary | ICD-10-CM

## 2013-12-28 ENCOUNTER — Ambulatory Visit: Payer: No Typology Code available for payment source | Admitting: Licensed Clinical Social Worker

## 2013-12-30 NOTE — Patient Instructions (Addendum)
Take antibiotics as directed. Labs drawn including CBC and TSH.

## 2013-12-30 NOTE — Progress Notes (Signed)
   Subjective:    Patient ID: [name] [name], female    DOB: 21-Jan-1961, 53 y.o.   MRN: [id number]  HPI 53 year old female in today with respiratory congestion, cough, and sore throat. History of anxiety and bipolar disorder. History of migraine headaches. Also complains of considerable malaise and fatigue. Denies being depressed.    Review of Systems     Objective:   Physical Exam Skin warm and dry. Nodes none. No thyromegaly. HEENT exam: Pharynx slightly injected without exudate. TMs are clear. Neck is supple. Chest clear.       Assessment & Plan:  Acute URI  Malaise and fatigue  Plan: CBC with differential, iron iron-binding capacity, TSH, C. met drawn. Take antibiotics as directed.

## 2014-01-04 ENCOUNTER — Other Ambulatory Visit: Payer: Self-pay

## 2014-01-04 MED ORDER — ALPRAZOLAM 0.25 MG PO TABS: 0.2500 mg | ORAL_TABLET | Freq: Three times a day (TID) | ORAL | Status: DC | PRN

## 2014-01-23 ENCOUNTER — Emergency Department (INDEPENDENT_AMBULATORY_CARE_PROVIDER_SITE_OTHER): Payer: No Typology Code available for payment source

## 2014-01-23 ENCOUNTER — Emergency Department (INDEPENDENT_AMBULATORY_CARE_PROVIDER_SITE_OTHER)
Admission: EM | Admit: 2014-01-23 | Discharge: 2014-01-23 | Disposition: A | Discharge status: Home / Self Care | Payer: No Typology Code available for payment source | Source: Home / Self Care | Attending: Family Medicine | Admitting: Family Medicine

## 2014-01-23 ENCOUNTER — Encounter (HOSPITAL_COMMUNITY): Payer: Self-pay | Admitting: Emergency Medicine

## 2014-01-23 DIAGNOSIS — R079 Chest pain, unspecified: Secondary | ICD-10-CM

## 2014-01-23 MED ORDER — ALBUTEROL SULFATE (2.5 MG/3ML) 0.083% IN NEBU
INHALATION_SOLUTION | RESPIRATORY_TRACT | Status: AC
  Filled 2014-01-23: qty 3

## 2014-01-23 MED ORDER — ACETAMINOPHEN 325 MG PO TABS
ORAL_TABLET | ORAL | Status: AC
  Filled 2014-01-23: qty 2

## 2014-01-23 MED ORDER — IPRATROPIUM BROMIDE 0.02 % IN SOLN
RESPIRATORY_TRACT | Status: AC
  Filled 2014-01-23: qty 2.5

## 2014-01-23 NOTE — ED Notes (Signed)
Dot Been PA wants pt. to go to the ED for further evaluation.  Pt. Refusing- states she was in ED recently and is still paying the bill  $14,000 and was told it was stress.  She states she has a $10,000 deductible and can't afford another bill.  Pt. signed AMA form after PA explained the possible consequences of her decision.

## 2014-01-23 NOTE — ED Provider Notes (Signed)
Medical screening examination/treatment/procedure(s) were performed by resident physician or non-physician practitioner and as supervising physician I was immediately available for consultation/collaboration.   Kaleigha Chamberlin DOUGLAS MD.   Sherronda Sweigert D Admire Bunnell, MD 01/23/14 2132 

## 2014-01-23 NOTE — ED Provider Notes (Signed)
CSN: [id number]     Arrival date & time 01/23/14  1644 History   First MD Initiated Contact with Patient 01/23/14 1719     Chief Complaint  Patient presents with  . Chest Pain   (Consider location/radiation/quality/duration/timing/severity/associated sxs/prior Treatment) Patient is a 53 y.o. female presenting with chest pain. The history is provided by the patient.  Chest Pain Pain location:  Substernal area Pain quality: aching, pressure and tightness   Pain radiates to:  Mid back Pain radiates to the back: yes   Pain severity:  Moderate Onset quality:  Gradual Duration:  1 day Timing:  Intermittent Progression:  Waxing and waning Chronicity:  New Context comment:  Began yesterday while at work. Works as a Secretary/administrator.  Relieved by:  Rest Worsened by:  Exertion (States symptoms reoccur when she has to carry or lift an object or walk up a flight of stairs.) Ineffective treatments:  Antacids Associated symptoms: abdominal pain, anorexia, back pain, fatigue, heartburn, nausea, shortness of breath and weakness   Associated symptoms: no near-syncope, no orthopnea, no palpitations, no PND, no syncope and not vomiting     Past Medical History  Diagnosis Date  . Bipolar disorder   . Migraine   . Helicobacter pylori (H. pylori) 07/2010  . Anxiety    Past Surgical History  Procedure Laterality Date  . Cervical fusion    . Tubal ligation    . Upper gastrointestinal endoscopy    . Abdominal hysterectomy  2001    USO by history-sono2011 question of both ovaries present   Family History  Problem Relation Age of Onset  . Hypertension Mother   . Hypertension Brother    History  Substance Use Topics  . Smoking status: Never Smoker   . Smokeless tobacco: Never Used  . Alcohol Use: No   OB History   Grav Para Term Preterm Abortions TAB SAB Ect Mult Living   4 3   1  1   3      Review of Systems  Constitutional: Positive for appetite change and fatigue.  HENT: Negative.    Eyes: Negative.   Respiratory: Positive for chest tightness and shortness of breath.   Cardiovascular: Positive for chest pain. Negative for palpitations, orthopnea, leg swelling, syncope, PND and near-syncope.  Gastrointestinal: Positive for heartburn, nausea, abdominal pain and anorexia. Negative for vomiting.  Endocrine: Negative for polydipsia, polyphagia and polyuria.  Genitourinary: Negative.   Musculoskeletal: Positive for back pain.  Skin: Negative.   Neurological: Positive for weakness.  Psychiatric/Behavioral: Negative.     Allergies  Review of patient's allergies indicates no known allergies.  Home Medications   Current Outpatient Rx  Name  Route  Sig  Dispense  Refill  . ALPRAZolam (XANAX) 0.25 MG tablet   Oral   Take 1 tablet (0.25 mg total) by mouth 3 (three) times daily as needed for anxiety.   90 tablet   5   . ARIPiprazole (ABILIFY) 2 MG tablet   Oral   Take 2 mg by mouth daily.         . cyclobenzaprine (FLEXERIL) 10 MG tablet   Oral   Take 1 tablet (10 mg total) by mouth 2 (two) times daily as needed for muscle spasms.   20 tablet   0   . estradiol (MINIVELLE) 0.1 MG/24HR patch      APPLY 1 PATCH TWICE DAILY WEEKLY TO SKIN   8 patch   11   . HYDROcodone-acetaminophen (NORCO/VICODIN) 5-325 MG per tablet  Oral   Take 1 tablet by mouth 2 (two) times daily as needed.   60 tablet   0   . HYDROcodone-acetaminophen (NORCO/VICODIN) 5-325 MG per tablet   Oral   Take 1 tablet by mouth 2 (two) times daily as needed. Fill after 11/10/2013   60 tablet   0   . ibuprofen (ADVIL,MOTRIN) 600 MG tablet   Oral   Take 1 tablet (600 mg total) by mouth every 8 (eight) hours as needed for pain.   30 tablet   0   . lamoTRIgine (LAMICTAL) 100 MG tablet   Oral   Take 100-150 mg by mouth 2 (two) times daily. Take 1 tablet every morning and take 1.5 tablets at night.         . meloxicam (MOBIC) 15 MG tablet               . Multiple Vitamin  (MULTIVITAMIN) capsule   Oral   Take 1 capsule by mouth daily.           . naratriptan (AMERGE) 2.5 MG tablet               . perphenazine (TRILAFON) 4 MG tablet               . promethazine (PHENERGAN) 25 MG tablet               . topiramate (TOPAMAX) 200 MG tablet   Oral   Take 400 mg by mouth daily.          Marland Kitchen zolpidem (AMBIEN) 5 MG tablet                BP 114/75  Pulse 66  Temp(Src) 99 F (37.2 C) (Oral)  Resp 12  SpO2 99%  LMP 11/02/2000 Physical Exam  Nursing note and vitals reviewed. Constitutional: She is oriented to person, place, and time. She appears well-developed and well-nourished. No distress.  HENT:  Head: Normocephalic and atraumatic.  Mouth/Throat: Oropharynx is clear and moist.  Eyes: Conjunctivae are normal. No scleral icterus.  Neck: Normal range of motion. Neck supple. No JVD present.  Cardiovascular: Normal rate, regular rhythm and normal heart sounds.   Pulmonary/Chest: Effort normal and breath sounds normal. No respiratory distress. She has no wheezes. She has no rales. She exhibits no tenderness.  Abdominal: Soft. Normal appearance and bowel sounds are normal. She exhibits no distension. There is no hepatosplenomegaly. There is no tenderness. There is no CVA tenderness. No hernia. Hernia confirmed negative in the ventral area.  Musculoskeletal: Normal range of motion. She exhibits no edema and no tenderness.  Neurological: She is alert and oriented to person, place, and time.  Skin: Skin is warm and dry.  Psychiatric: She has a normal mood and affect. Her behavior is normal.    ED Course  Procedures (including critical care time) Labs Review Labs Reviewed - No data to display Imaging Review Dg Chest 2 View  01/23/2014   CLINICAL DATA:  Cough for 1 week.  Left-sided chest pain for 2 days.  EXAM: CHEST  2 VIEW  COMPARISON:  DG RIBS UNILATERAL W/CHEST*L* dated 02/09/2013  FINDINGS: The cardiomediastinal silhouette is within  normal limits. The lungs are well inflated and clear. No pleural effusion or pneumothorax is identified. Mild S-shaped thoracolumbar scoliosis is unchanged.  IMPRESSION: No active cardiopulmonary disease.   Electronically Signed   By: Logan Bores   On: 01/23/2014 17:53     MDM   1. Chest pain  Chest Pain of unknown etiology: Advised patient and her husband that she should be transferred to [facility] ER for further evaluation of her chest pain and she declined transfer. Filled out AMA paperwork with patient, her husband and RN S. York at bedside. Risk and benefits of refusal of transfer discussed with both patient and husband and they both voice a clear understanding of treatment advised and risks of declining transfer for further evaluation. They clearly understand that they are welcome to return for treatment at any time.     [place], [place] 01/23/14 1908  Addendum: chart reopened to record results of  ECG report obtained at 1725 this evening while patient at [facility]. ECG: Sinus bradycardia at 58 bpm without ectopy or acute ST/T wave changes.   [place], [place] 01/23/14 2025

## 2014-01-23 NOTE — ED Notes (Signed)
Pt. instructed to f/u with Dr. Renold Genta ASAP.

## 2014-01-23 NOTE — ED Notes (Signed)
Pt  Has  Symptoms  Of chest  Pain  At this  Time        -  Started  Last  Pm   Pain radiates  Around  To  Her  Back    Pt  Reports  The  Pain as  A  5       Pt  Reports  Pain is  Worse on deep breath       Skin is  Warm and  Dry  Pt is  Alert  And  Oriented           Speaking in  Complete  sentances

## 2014-02-05 ENCOUNTER — Ambulatory Visit (INDEPENDENT_AMBULATORY_CARE_PROVIDER_SITE_OTHER): Payer: No Typology Code available for payment source | Admitting: Internal Medicine

## 2014-02-05 ENCOUNTER — Encounter: Payer: Self-pay | Admitting: Internal Medicine

## 2014-02-05 VITALS — BP 108/78 | HR 68 | Temp 98.8°F | Wt 122.5 lb

## 2014-02-05 DIAGNOSIS — R5383 Other fatigue: Secondary | ICD-10-CM

## 2014-02-05 DIAGNOSIS — H8110 Benign paroxysmal vertigo, unspecified ear: Secondary | ICD-10-CM

## 2014-02-05 DIAGNOSIS — R5381 Other malaise: Secondary | ICD-10-CM

## 2014-02-05 DIAGNOSIS — H6593 Unspecified nonsuppurative otitis media, bilateral: Secondary | ICD-10-CM

## 2014-02-05 DIAGNOSIS — H6590 Unspecified nonsuppurative otitis media, unspecified ear: Secondary | ICD-10-CM

## 2014-02-05 LAB — CBC WITH DIFFERENTIAL/PLATELET
Basophils Absolute: 0 10*3/uL (ref 0.0–0.1)
Basophils Relative: 0 % (ref 0–1)
EOS PCT: 1 % (ref 0–5)
Eosinophils Absolute: 0 10*3/uL (ref 0.0–0.7)
HEMATOCRIT: 38.3 % (ref 36.0–46.0)
HEMOGLOBIN: 13.3 g/dL (ref 12.0–15.0)
LYMPHS ABS: 2 10*3/uL (ref 0.7–4.0)
LYMPHS PCT: 43 % (ref 12–46)
MCH: 31.1 pg (ref 26.0–34.0)
MCHC: 34.7 g/dL (ref 30.0–36.0)
MCV: 89.7 fL (ref 78.0–100.0)
MONO ABS: 0.5 10*3/uL (ref 0.1–1.0)
MONOS PCT: 10 % (ref 3–12)
Neutro Abs: 2.2 10*3/uL (ref 1.7–7.7)
Neutrophils Relative %: 46 % (ref 43–77)
PLATELETS: 211 10*3/uL (ref 150–400)
RBC: 4.27 MIL/uL (ref 3.87–5.11)
RDW: 13.9 % (ref 11.5–15.5)
WBC: 4.7 10*3/uL (ref 4.0–10.5)

## 2014-02-05 LAB — COMPREHENSIVE METABOLIC PANEL
ALT: 14 U/L (ref 0–35)
AST: 18 U/L (ref 0–37)
Albumin: 4 g/dL (ref 3.5–5.2)
Alkaline Phosphatase: 31 U/L — ABNORMAL LOW (ref 39–117)
BUN: 12 mg/dL (ref 6–23)
CALCIUM: 9.1 mg/dL (ref 8.4–10.5)
CHLORIDE: 109 meq/L (ref 96–112)
CO2: 24 meq/L (ref 19–32)
CREATININE: 0.81 mg/dL (ref 0.50–1.10)
GLUCOSE: 80 mg/dL (ref 70–99)
Potassium: 4.3 mEq/L (ref 3.5–5.3)
Sodium: 139 mEq/L (ref 135–145)
Total Bilirubin: 0.3 mg/dL (ref 0.2–1.2)
Total Protein: 6.7 g/dL (ref 6.0–8.3)

## 2014-02-05 LAB — TSH: TSH: 0.941 u[IU]/mL (ref 0.350–4.500)

## 2014-02-05 LAB — HEMOGLOBIN A1C
HEMOGLOBIN A1C: 5.3 % (ref <5.7)
MEAN PLASMA GLUCOSE: 105 mg/dL (ref <117)

## 2014-02-05 LAB — T4, FREE: FREE T4: 0.95 ng/dL (ref 0.80–1.80)

## 2014-02-05 MED ORDER — MECLIZINE HCL 25 MG PO TABS: 25.0000 mg | ORAL_TABLET | Freq: Three times a day (TID) | ORAL | Status: DC | PRN

## 2014-02-05 MED ORDER — LEVOFLOXACIN 500 MG PO TABS: 500.0000 mg | ORAL_TABLET | Freq: Every day | ORAL | Status: DC

## 2014-02-05 NOTE — Progress Notes (Signed)
   Subjective:    Patient ID: [name] [name], female    DOB: 03-01-1961, 53 y.o.   MRN: [id number]  HPI Patient went to the urgent care Center with chest pain on March 24. Was advised to go to the emergency department for further evaluation but she did not want to do that because of expansive ago she had a previous evaluation in the past. Chest pain has since resolved. However since that time she has felt extremely fatigued. Denies being depressed. However she is no longer taking Abilify or Xanax. Says psychiatrist weaned her off of these medications. She remains on lamotrigine Topamax. No longer taking Ambien. Says she's getting along well with her husband. Son is been excepted to [facility] in the pharmacy program. 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 She had lab work including CBC and TSH done in November which was normal. No lab work was done on recent urgent care visit. Complaining of some vague dizziness. Some ringing in her ears. Says she has some discolored sputum production. Says chest x-ray was taken recently and was normal. Chest x-ray was done March 24 and was within normal limits. Also having some vague left lower corner abdominal pain. Says she has a bowel movement sometimes twice a day and sometimes once every 3 days. Dr. Phineas Real is GYN physician and she says annual exam is not due until October.   Review of Systems     Objective:   Physical Exam skin is warm and dry. Nodes none. TMs are full bilaterally but not red. Pharynx is clear. Neck is supple without adenopathy or thyromegaly. Chest clear to auscultation. Cardiac exam regular rate and rhythm. Abdomen soft nondistended no hepatosplenomegaly masses. Vague left lower quadrant tenderness without significant rebound tenderness. Neurological exam: Cranial nerves II through XII grossly intact. She has some nystagmus to  the left. Funduscopic exam is benign. Moves all 4 extremities and has normal muscle strength in all 4 extremities       Assessment & Plan:  Bilateral serous otitis media  Vertigo  Fatigue-etiology unclear  Plan: Lab work will be drawn today including CBC, C. met, TSH, hemoglobin A1c. Treat with Antivert 25 mg 3 times daily as needed for dizziness. Levaquin 500 milligrams daily for 7 days for respiratory infection.

## 2014-02-05 NOTE — Patient Instructions (Signed)
Take Antivert and Levaquin as directed. Lab work is pending. Call if not better in 2 weeks.

## 2014-03-02 ENCOUNTER — Ambulatory Visit (INDEPENDENT_AMBULATORY_CARE_PROVIDER_SITE_OTHER): Payer: No Typology Code available for payment source | Admitting: Licensed Clinical Social Worker

## 2014-03-02 DIAGNOSIS — F3189 Other bipolar disorder: Secondary | ICD-10-CM

## 2014-03-22 ENCOUNTER — Ambulatory Visit (INDEPENDENT_AMBULATORY_CARE_PROVIDER_SITE_OTHER): Payer: No Typology Code available for payment source | Admitting: Licensed Clinical Social Worker

## 2014-03-22 DIAGNOSIS — F3189 Other bipolar disorder: Secondary | ICD-10-CM

## 2014-04-10 ENCOUNTER — Ambulatory Visit (INDEPENDENT_AMBULATORY_CARE_PROVIDER_SITE_OTHER): Payer: No Typology Code available for payment source | Admitting: Licensed Clinical Social Worker

## 2014-04-10 DIAGNOSIS — F3189 Other bipolar disorder: Secondary | ICD-10-CM

## 2014-04-12 ENCOUNTER — Ambulatory Visit: Payer: No Typology Code available for payment source | Admitting: Licensed Clinical Social Worker

## 2014-04-17 ENCOUNTER — Telehealth: Payer: Self-pay | Admitting: Internal Medicine

## 2014-04-17 ENCOUNTER — Other Ambulatory Visit: Payer: Self-pay

## 2014-04-17 MED ORDER — HYDROCODONE-ACETAMINOPHEN 5-325 MG PO TABS: 1.0000 | ORAL_TABLET | Freq: Two times a day (BID) | ORAL | Status: DC | PRN

## 2014-04-17 NOTE — Telephone Encounter (Signed)
Refill once 

## 2014-04-19 ENCOUNTER — Ambulatory Visit (INDEPENDENT_AMBULATORY_CARE_PROVIDER_SITE_OTHER): Payer: No Typology Code available for payment source | Admitting: Licensed Clinical Social Worker

## 2014-04-19 DIAGNOSIS — F3189 Other bipolar disorder: Secondary | ICD-10-CM

## 2014-05-17 ENCOUNTER — Ambulatory Visit (INDEPENDENT_AMBULATORY_CARE_PROVIDER_SITE_OTHER): Payer: No Typology Code available for payment source | Admitting: Licensed Clinical Social Worker

## 2014-05-17 DIAGNOSIS — F3189 Other bipolar disorder: Secondary | ICD-10-CM

## 2014-06-05 ENCOUNTER — Ambulatory Visit (INDEPENDENT_AMBULATORY_CARE_PROVIDER_SITE_OTHER): Payer: No Typology Code available for payment source | Admitting: Licensed Clinical Social Worker

## 2014-06-05 DIAGNOSIS — F3189 Other bipolar disorder: Secondary | ICD-10-CM

## 2014-06-12 ENCOUNTER — Ambulatory Visit (INDEPENDENT_AMBULATORY_CARE_PROVIDER_SITE_OTHER): Payer: No Typology Code available for payment source | Admitting: Internal Medicine

## 2014-06-12 ENCOUNTER — Encounter: Payer: Self-pay | Admitting: Internal Medicine

## 2014-06-12 VITALS — BP 116/78 | HR 76 | Temp 98.4°F | Wt 121.0 lb

## 2014-06-12 DIAGNOSIS — J069 Acute upper respiratory infection, unspecified: Secondary | ICD-10-CM

## 2014-06-12 DIAGNOSIS — H6500 Acute serous otitis media, unspecified ear: Secondary | ICD-10-CM

## 2014-06-12 DIAGNOSIS — H6502 Acute serous otitis media, left ear: Secondary | ICD-10-CM

## 2014-06-12 MED ORDER — AZITHROMYCIN 250 MG PO TABS: ORAL_TABLET | ORAL | Status: DC

## 2014-06-12 MED ORDER — HYDROCODONE-HOMATROPINE 5-1.5 MG/5ML PO SYRP: 5.0000 mL | ORAL_SOLUTION | Freq: Three times a day (TID) | ORAL | Status: DC | PRN

## 2014-06-12 NOTE — Patient Instructions (Signed)
Take Z-pak as directed and Hycodan as needed for cough.

## 2014-06-13 NOTE — Progress Notes (Signed)
   Subjective:    Patient ID: [name] [name], female    DOB: 03/27/1961, 53 y.o.   MRN: [id number]  HPI Several day history of sore throat cough and congestion. No documented fever. Has malaise and fatigue. Slight productive cough with light discolored sputum.    Review of Systems     Objective:   Physical Exam  Pharynx not injected. Left TM slightly full. Right TM clear. Neck is supple without adenopathy. Chest clear. She sounds nasally congested      Assessment & Plan:  Acute URI  Acute left serous otitis media  Plan: Hycodan 120 cc 1 teaspoon by mouth Q8 to 12 hours when necessary cough. Zithromax Z-PAK take 2 tablets day one followed by 1 tablet days 2 through 5.  Patient tells me she is divorcing her current husband and that her son from a previous marriage is going off to [employer] to study pharmacy.

## 2014-07-06 ENCOUNTER — Ambulatory Visit: Payer: No Typology Code available for payment source | Admitting: Licensed Clinical Social Worker

## 2014-07-26 ENCOUNTER — Encounter: Payer: Self-pay | Admitting: Gastroenterology

## 2014-09-03 ENCOUNTER — Encounter: Payer: Self-pay | Admitting: Internal Medicine

## 2014-10-24 ENCOUNTER — Encounter: Payer: Self-pay | Admitting: Gynecology

## 2014-10-24 ENCOUNTER — Ambulatory Visit (INDEPENDENT_AMBULATORY_CARE_PROVIDER_SITE_OTHER): Payer: No Typology Code available for payment source | Admitting: Gynecology

## 2014-10-24 ENCOUNTER — Other Ambulatory Visit (HOSPITAL_COMMUNITY)
Admission: RE | Admit: 2014-10-24 | Discharge: 2014-10-24 | Disposition: A | Discharge status: Home / Self Care | Payer: No Typology Code available for payment source | Source: Ambulatory Visit | Attending: Gynecology | Admitting: Gynecology

## 2014-10-24 VITALS — BP 110/66 | Ht 61.0 in | Wt 120.0 lb

## 2014-10-24 DIAGNOSIS — Z01419 Encounter for gynecological examination (general) (routine) without abnormal findings: Secondary | ICD-10-CM

## 2014-10-24 DIAGNOSIS — Z79890 Hormone replacement therapy (postmenopausal): Secondary | ICD-10-CM

## 2014-10-24 MED ORDER — ESTRADIOL 0.1 MG/24HR TD PTTW: MEDICATED_PATCH | TRANSDERMAL | Status: DC

## 2014-10-24 NOTE — Progress Notes (Signed)
[name] [name] [birth year] [id number]        53 y.o.  G4P0013 for annual exam.  Doing well.  Past medical history,surgical history, problem list, medications, allergies, family history and social history were all reviewed and documented as reviewed in the EPIC chart.  ROS:  Performed with pertinent positives and negatives included in the history, assessment and plan.   Additional significant findings :  none   Exam: Kim Counsellor Vitals:   10/24/14 1146  BP: 110/66  Height: 5\' 1"  (1.549 m)  Weight: 120 lb (54.432 kg)   General appearance:  Normal affect, orientation and appearance. Skin: Grossly normal HEENT: Without gross lesions.  No cervical or supraclavicular adenopathy. Thyroid normal.  Lungs:  Clear without wheezing, rales or rhonchi Cardiac: RR, without RMG Abdominal:  Soft, nontender, without masses, guarding, rebound, organomegaly or hernia Breasts:  Examined lying and sitting without masses, retractions, discharge or axillary adenopathy. Pelvic:  Ext/BUS/vagina with mild atrophic changes  Adnexa  Without masses or tenderness    Anus and perineum  Normal   Rectovaginal  Normal sphincter tone without palpated masses or tenderness.    Assessment/Plan:  53 y.o. G45P0013 female for annual exam.   1. Postmenopausal/HRT. Patient continues on minivelle 0.1 mg patch is doing well. Wants to continue. I again reviewed the issues of HRT to include the WHI study with increased risk of stroke, heart attack, DVT and breast cancer. Benefits of transdermal absorption reviewed. Issues of weaning and ACOG and NAMS statements for lowest dose for shortest period of time discussed. Patient once to continue and I refilled her 1 year. 2. Pap smear 2012. Pap smear of vaginal cuff today. No history of significant abnormal Pap smears. Status post hysterectomy. Options to stop screening altogether per current screening guidelines discussed. Will readdress on an annual  basis. 3. Colonoscopy 2011. Repeat at their recommended interval. 4. Mammography 11/2013. Continue with annual mammography. SBE monthly reviewed. 5. DEXA never. Plan at age 32. Increased calcium vitamin D reviewed. 6. Health maintenance. No routine blood work done as she has this done through Dr. Verlene Mayer office. Follow up 1 year, sooner as needed.     Anastasio Auerbach MD, 12:13 PM 10/24/2014

## 2014-10-24 NOTE — Patient Instructions (Signed)
You may obtain a copy of any labs that were done today by logging onto MyChart as outlined in the instructions provided with your AVS (after visit summary). The office will not call with normal lab results but certainly if there are any significant abnormalities then we will contact you.   Health Maintenance, Female A healthy lifestyle and preventative care can promote health and wellness.  Maintain regular health, dental, and eye exams.  Eat a healthy diet. Foods like vegetables, fruits, whole grains, low-fat dairy products, and lean protein foods contain the nutrients you need without too many calories. Decrease your intake of foods high in solid fats, added sugars, and salt. Get information about a proper diet from your caregiver, if necessary.  Regular physical exercise is one of the most important things you can do for your health. Most adults should get at least 150 minutes of moderate-intensity exercise (any activity that increases your heart rate and causes you to sweat) each week. In addition, most adults need muscle-strengthening exercises on 2 or more days a week.   Maintain a healthy weight. The body mass index (BMI) is a screening tool to identify possible weight problems. It provides an estimate of body fat based on height and weight. Your caregiver can help determine your BMI, and can help you achieve or maintain a healthy weight. For adults 20 years and older:  A BMI below 18.5 is considered underweight.  A BMI of 18.5 to 24.9 is normal.  A BMI of 25 to 29.9 is considered overweight.  A BMI of 30 and above is considered obese.  Maintain normal blood lipids and cholesterol by exercising and minimizing your intake of saturated fat. Eat a balanced diet with plenty of fruits and vegetables. Blood tests for lipids and cholesterol should begin at age 61 and be repeated every 5 years. If your lipid or cholesterol levels are high, you are over 50, or you are a high risk for heart  disease, you may need your cholesterol levels checked more frequently.Ongoing high lipid and cholesterol levels should be treated with medicines if diet and exercise are not effective.  If you smoke, find out from your caregiver how to quit. If you do not use tobacco, do not start.  Lung cancer screening is recommended for adults aged 33 80 years who are at high risk for developing lung cancer because of a history of smoking. Yearly low-dose computed tomography (CT) is recommended for people who have at least a 30-pack-year history of smoking and are a current smoker or have quit within the past 15 years. A pack year of smoking is smoking an average of 1 pack of cigarettes a day for 1 year (for example: 1 pack a day for 30 years or 2 packs a day for 15 years). Yearly screening should continue until the smoker has stopped smoking for at least 15 years. Yearly screening should also be stopped for people who develop a health problem that would prevent them from having lung cancer treatment.  If you are pregnant, do not drink alcohol. If you are breastfeeding, be very cautious about drinking alcohol. If you are not pregnant and choose to drink alcohol, do not exceed 1 drink per day. One drink is considered to be 12 ounces (355 mL) of beer, 5 ounces (148 mL) of wine, or 1.5 ounces (44 mL) of liquor.  Avoid use of street drugs. Do not share needles with anyone. Ask for help if you need support or instructions about stopping  the use of drugs.  High blood pressure causes heart disease and increases the risk of stroke. Blood pressure should be checked at least every 1 to 2 years. Ongoing high blood pressure should be treated with medicines, if weight loss and exercise are not effective.  If you are 59 to 52 years old, ask your caregiver if you should take aspirin to prevent strokes.  Diabetes screening involves taking a blood sample to check your fasting blood sugar level. This should be done once every 3  years, after age 91, if you are within normal weight and without risk factors for diabetes. Testing should be considered at a younger age or be carried out more frequently if you are overweight and have at least 1 risk factor for diabetes.  Breast cancer screening is essential preventative care for women. You should practice "breast self-awareness." This means understanding the normal appearance and feel of your breasts and may include breast self-examination. Any changes detected, no matter how small, should be reported to a caregiver. Women in their 66s and 30s should have a clinical breast exam (CBE) by a caregiver as part of a regular health exam every 1 to 3 years. After age 101, women should have a CBE every year. Starting at age 100, women should consider having a mammogram (breast X-ray) every year. Women who have a family history of breast cancer should talk to their caregiver about genetic screening. Women at a high risk of breast cancer should talk to their caregiver about having an MRI and a mammogram every year.  Breast cancer gene (BRCA)-related cancer risk assessment is recommended for women who have family members with BRCA-related cancers. BRCA-related cancers include breast, ovarian, tubal, and peritoneal cancers. Having family members with these cancers may be associated with an increased risk for harmful changes (mutations) in the breast cancer genes BRCA1 and BRCA2. Results of the assessment will determine the need for genetic counseling and BRCA1 and BRCA2 testing.  The Pap test is a screening test for cervical cancer. Women should have a Pap test starting at age 57. Between ages 25 and 35, Pap tests should be repeated every 2 years. Beginning at age 37, you should have a Pap test every 3 years as long as the past 3 Pap tests have been normal. If you had a hysterectomy for a problem that was not cancer or a condition that could lead to cancer, then you no longer need Pap tests. If you are  between ages 50 and 76, and you have had normal Pap tests going back 10 years, you no longer need Pap tests. If you have had past treatment for cervical cancer or a condition that could lead to cancer, you need Pap tests and screening for cancer for at least 20 years after your treatment. If Pap tests have been discontinued, risk factors (such as a new sexual partner) need to be reassessed to determine if screening should be resumed. Some women have medical problems that increase the chance of getting cervical cancer. In these cases, your caregiver may recommend more frequent screening and Pap tests.  The human papillomavirus (HPV) test is an additional test that may be used for cervical cancer screening. The HPV test looks for the virus that can cause the cell changes on the cervix. The cells collected during the Pap test can be tested for HPV. The HPV test could be used to screen women aged 44 years and older, and should be used in women of any age  who have unclear Pap test results. After the age of 55, women should have HPV testing at the same frequency as a Pap test.  Colorectal cancer can be detected and often prevented. Most routine colorectal cancer screening begins at the age of 44 and continues through age 20. However, your caregiver may recommend screening at an earlier age if you have risk factors for colon cancer. On a yearly basis, your caregiver may provide home test kits to check for hidden blood in the stool. Use of a small camera at the end of a tube, to directly examine the colon (sigmoidoscopy or colonoscopy), can detect the earliest forms of colorectal cancer. Talk to your caregiver about this at age 86, when routine screening begins. Direct examination of the colon should be repeated every 5 to 10 years through age 13, unless early forms of pre-cancerous polyps or small growths are found.  Hepatitis C blood testing is recommended for all people born from [birth year] through 1964 and any  individual with known risks for hepatitis C.  Practice safe sex. Use condoms and avoid high-risk sexual practices to reduce the spread of sexually transmitted infections (STIs). Sexually active women aged 36 and younger should be checked for Chlamydia, which is a common sexually transmitted infection. Older women with new or multiple partners should also be tested for Chlamydia. Testing for other STIs is recommended if you are sexually active and at increased risk.  Osteoporosis is a disease in which the bones lose minerals and strength with aging. This can result in serious bone fractures. The risk of osteoporosis can be identified using a bone density scan. Women ages 20 and over and women at risk for fractures or osteoporosis should discuss screening with their caregivers. Ask your caregiver whether you should be taking a calcium supplement or vitamin D to reduce the rate of osteoporosis.  Menopause can be associated with physical symptoms and risks. Hormone replacement therapy is available to decrease symptoms and risks. You should talk to your caregiver about whether hormone replacement therapy is right for you.  Use sunscreen. Apply sunscreen liberally and repeatedly throughout the day. You should seek shade when your shadow is shorter than you. Protect yourself by wearing long sleeves, pants, a wide-brimmed hat, and sunglasses year round, whenever you are outdoors.  Notify your caregiver of new moles or changes in moles, especially if there is a change in shape or color. Also notify your caregiver if a mole is larger than the size of a pencil eraser.  Stay current with your immunizations. Document Released: 05/04/2011 Document Revised: 02/13/2013 Document Reviewed: 05/04/2011 [facility] Patient Information 2014 [place].

## 2014-10-24 NOTE — Addendum Note (Signed)
Addended by: Nelva Nay on: 10/24/2014 12:25 PM   Modules accepted: Orders, SmartSet

## 2014-10-25 LAB — URINALYSIS W MICROSCOPIC + REFLEX CULTURE
BACTERIA UA: NONE SEEN
Bilirubin Urine: NEGATIVE
CASTS: NONE SEEN
Crystals: NONE SEEN
GLUCOSE, UA: NEGATIVE mg/dL
HGB URINE DIPSTICK: NEGATIVE
KETONES UR: NEGATIVE mg/dL
Leukocytes, UA: NEGATIVE
NITRITE: NEGATIVE
PH: 7 (ref 5.0–8.0)
Protein, ur: NEGATIVE mg/dL
Specific Gravity, Urine: 1.007 (ref 1.005–1.030)
Squamous Epithelial / LPF: NONE SEEN
Urobilinogen, UA: 0.2 mg/dL (ref 0.0–1.0)

## 2014-10-28 ENCOUNTER — Other Ambulatory Visit: Payer: Self-pay | Admitting: Gynecology

## 2014-10-30 LAB — CYTOLOGY - PAP

## 2014-11-17 ENCOUNTER — Emergency Department (HOSPITAL_COMMUNITY): Payer: No Typology Code available for payment source

## 2014-11-17 ENCOUNTER — Encounter (HOSPITAL_COMMUNITY): Payer: Self-pay | Admitting: Cardiology

## 2014-11-17 ENCOUNTER — Emergency Department (HOSPITAL_COMMUNITY)
Admission: EM | Admit: 2014-11-17 | Discharge: 2014-11-17 | Disposition: A | Discharge status: Home / Self Care | Payer: No Typology Code available for payment source | Attending: Emergency Medicine | Admitting: Emergency Medicine

## 2014-11-17 DIAGNOSIS — S00531A Contusion of lip, initial encounter: Secondary | ICD-10-CM | POA: Diagnosis not present

## 2014-11-17 DIAGNOSIS — Y998 Other external cause status: Secondary | ICD-10-CM | POA: Diagnosis not present

## 2014-11-17 DIAGNOSIS — Y9389 Activity, other specified: Secondary | ICD-10-CM | POA: Insufficient documentation

## 2014-11-17 DIAGNOSIS — Z87891 Personal history of nicotine dependence: Secondary | ICD-10-CM | POA: Diagnosis not present

## 2014-11-17 DIAGNOSIS — R51 Headache: Secondary | ICD-10-CM

## 2014-11-17 DIAGNOSIS — R519 Headache, unspecified: Secondary | ICD-10-CM

## 2014-11-17 DIAGNOSIS — S4992XA Unspecified injury of left shoulder and upper arm, initial encounter: Secondary | ICD-10-CM | POA: Diagnosis not present

## 2014-11-17 DIAGNOSIS — Z8619 Personal history of other infectious and parasitic diseases: Secondary | ICD-10-CM | POA: Diagnosis not present

## 2014-11-17 DIAGNOSIS — G43909 Migraine, unspecified, not intractable, without status migrainosus: Secondary | ICD-10-CM | POA: Insufficient documentation

## 2014-11-17 DIAGNOSIS — Z79899 Other long term (current) drug therapy: Secondary | ICD-10-CM | POA: Insufficient documentation

## 2014-11-17 DIAGNOSIS — Y9289 Other specified places as the place of occurrence of the external cause: Secondary | ICD-10-CM | POA: Diagnosis not present

## 2014-11-17 DIAGNOSIS — S0990XA Unspecified injury of head, initial encounter: Secondary | ICD-10-CM | POA: Diagnosis present

## 2014-11-17 DIAGNOSIS — F319 Bipolar disorder, unspecified: Secondary | ICD-10-CM | POA: Insufficient documentation

## 2014-11-17 DIAGNOSIS — F419 Anxiety disorder, unspecified: Secondary | ICD-10-CM | POA: Diagnosis not present

## 2014-11-17 MED ORDER — OXYCODONE-ACETAMINOPHEN 5-325 MG PO TABS: 1.0000 | ORAL_TABLET | Freq: Four times a day (QID) | ORAL | Status: DC | PRN

## 2014-11-17 MED ORDER — HYDROMORPHONE HCL 1 MG/ML IJ SOLN
1.0000 mg | Freq: Once | INTRAMUSCULAR | Status: AC
  Administered 2014-11-17: 1 mg via INTRAMUSCULAR
  Filled 2014-11-17: qty 1

## 2014-11-17 MED ORDER — MORPHINE SULFATE 4 MG/ML IJ SOLN
4.0000 mg | Freq: Once | INTRAMUSCULAR | Status: AC
  Administered 2014-11-17: 4 mg via INTRAMUSCULAR
  Filled 2014-11-17: qty 1

## 2014-11-17 NOTE — Discharge Instructions (Signed)
Shoulder Pain The shoulder is the joint that connects your arms to your body. The bones that form the shoulder joint include the upper arm bone (humerus), the shoulder blade (scapula), and the collarbone (clavicle). The top of the humerus is shaped like a ball and fits into a rather flat socket on the scapula (glenoid cavity). A combination of muscles and strong, fibrous tissues that connect muscles to bones (tendons) support your shoulder joint and hold the ball in the socket. Small, fluid-filled sacs (bursae) are located in different areas of the joint. They act as cushions between the bones and the overlying soft tissues and help reduce friction between the gliding tendons and the bone as you move your arm. Your shoulder joint allows a wide range of motion in your arm. This range of motion allows you to do things like scratch your back or throw a ball. However, this range of motion also makes your shoulder more prone to pain from overuse and injury. Causes of shoulder pain can originate from both injury and overuse and usually can be grouped in the following four categories:  Redness, swelling, and pain (inflammation) of the tendon (tendinitis) or the bursae (bursitis).  Instability, such as a dislocation of the joint.  Inflammation of the joint (arthritis).  Broken bone (fracture). HOME CARE INSTRUCTIONS   Apply ice to the sore area.  Put ice in a plastic bag.  Place a towel between your skin and the bag.  Leave the ice on for 15-20 minutes, 3-4 times per day for the first 2 days, or as directed by your health care provider.  Stop using cold packs if they do not help with the pain.  If you have a shoulder sling or immobilizer, wear it as long as your caregiver instructs. Only remove it to shower or bathe. Move your arm as little as possible, but keep your hand moving to prevent swelling.  Squeeze a soft ball or foam pad as much as possible to help prevent swelling.  Only take  over-the-counter or prescription medicines for pain, discomfort, or fever as directed by your caregiver. SEEK MEDICAL CARE IF:   Your shoulder pain increases, or new pain develops in your arm, hand, or fingers.  Your hand or fingers become cold and numb.  Your pain is not relieved with medicines. SEEK IMMEDIATE MEDICAL CARE IF:   Your arm, hand, or fingers are numb or tingling.  Your arm, hand, or fingers are significantly swollen or turn white or blue. MAKE SURE YOU:   Understand these instructions.  Will watch your condition.  Will get help right away if you are not doing well or get worse. Document Released: 07/29/2005 Document Revised: 03/05/2014 Document Reviewed: 10/03/2011 [facility] Patient Information 2015 [place], [place]. This information is not intended to replace advice given to you by your health care provider. Make sure you discuss any questions you have with your health care provider. Head Injury You have received a head injury. It does not appear serious at this time. Headaches and vomiting are common following head injury. It should be easy to awaken from sleeping. Sometimes it is necessary for you to stay in the emergency department for a while for observation. Sometimes admission to the hospital may be needed. After injuries such as yours, most problems occur within the first 24 hours, but side effects may occur up to 7-10 days after the injury. It is important for you to carefully monitor your condition and contact your health care provider or seek  immediate medical care if there is a change in your condition. WHAT ARE THE TYPES OF HEAD INJURIES? Head injuries can be as minor as a bump. Some head injuries can be more severe. More severe head injuries include:  A jarring injury to the brain (concussion).  A bruise of the brain (contusion). This mean there is bleeding in the brain that can cause swelling.  A cracked skull (skull fracture).  Bleeding in the brain that  collects, clots, and forms a bump (hematoma). WHAT CAUSES A HEAD INJURY? A serious head injury is most likely to happen to someone who is in a car wreck and is not wearing a seat belt. Other causes of major head injuries include bicycle or motorcycle accidents, sports injuries, and falls. HOW ARE HEAD INJURIES DIAGNOSED? A complete history of the event leading to the injury and your current symptoms will be helpful in diagnosing head injuries. Many times, pictures of the brain, such as CT or MRI are needed to see the extent of the injury. Often, an overnight hospital stay is necessary for observation.  WHEN SHOULD I SEEK IMMEDIATE MEDICAL CARE?  You should get help right away if:  You have confusion or drowsiness.  You feel sick to your stomach (nauseous) or have continued, forceful vomiting.  You have dizziness or unsteadiness that is getting worse.  You have severe, continued headaches not relieved by medicine. Only take over-the-counter or prescription medicines for pain, fever, or discomfort as directed by your health care provider.  You do not have normal function of the arms or legs or are unable to walk.  You notice changes in the black spots in the center of the colored part of your eye (pupil).  You have a clear or bloody fluid coming from your nose or ears.  You have a loss of vision. During the next 24 hours after the injury, you must stay with someone who can watch you for the warning signs. This person should contact local emergency services (911 in the [place] if you have seizures, you become unconscious, or you are unable to wake up. HOW CAN I PREVENT A HEAD INJURY IN THE FUTURE? The most important factor for preventing major head injuries is avoiding motor vehicle accidents. To minimize the potential for damage to your head, it is crucial to wear seat belts while riding in motor vehicles. Wearing helmets while bike riding and playing collision sports (like football) is also  helpful. Also, avoiding dangerous activities around the house will further help reduce your risk of head injury.  WHEN CAN I RETURN TO NORMAL ACTIVITIES AND ATHLETICS? You should be reevaluated by your health care provider before returning to these activities. If you have any of the following symptoms, you should not return to activities or contact sports until 1 week after the symptoms have stopped:  Persistent headache.  Dizziness or vertigo.  Poor attention and concentration.  Confusion.  Memory problems.  Nausea or vomiting.  Fatigue or tire easily.  Irritability.  Intolerant of bright lights or loud noises.  Anxiety or depression.  Disturbed sleep. MAKE SURE YOU:   Understand these instructions.  Will watch your condition.  Will get help right away if you are not doing well or get worse. Document Released: 10/19/2005 Document Revised: 10/24/2013 Document Reviewed: 06/26/2013 [facility] Patient Information 2015 [place], [place]. This information is not intended to replace advice given to you by your health care provider. Make sure you discuss any questions you have with your health care  provider. Assault, General Assault includes any behavior, whether intentional or reckless, which results in bodily injury to another person and/or damage to property. Included in this would be any behavior, intentional or reckless, that by its nature would be understood (interpreted) by a reasonable person as intent to harm another person or to damage his/her property. Threats may be oral or written. They may be communicated through regular mail, computer, fax, or phone. These threats may be direct or implied. FORMS OF ASSAULT INCLUDE:  Physically assaulting a person. This includes physical threats to inflict physical harm as well as:  Slapping.  Hitting.  Poking.  Kicking.  Punching.  Pushing.  Arson.  Sabotage.  Equipment vandalism.  Damaging or destroying  property.  Throwing or hitting objects.  Displaying a weapon or an object that appears to be a weapon in a threatening manner.  Carrying a firearm of any kind.  Using a weapon to harm someone.  Using greater physical size/strength to intimidate another.  Making intimidating or threatening gestures.  Bullying.  Hazing.  Intimidating, threatening, hostile, or abusive language directed toward another person.  It communicates the intention to engage in violence against that person. And it leads a reasonable person to expect that violent behavior may occur.  Stalking another person. IF IT HAPPENS AGAIN:  Immediately call for emergency help (911 in [place]  If someone poses clear and immediate danger to you, seek legal authorities to have a protective or restraining order put in place.  Less threatening assaults can at least be reported to authorities. STEPS TO TAKE IF A SEXUAL ASSAULT HAS HAPPENED  Go to an area of safety. This may include a shelter or staying with a friend. Stay away from the area where you have been attacked. A large percentage of sexual assaults are caused by a friend, relative or associate.  If medications were given by your caregiver, take them as directed for the full length of time prescribed.  Only take over-the-counter or prescription medicines for pain, discomfort, or fever as directed by your caregiver.  If you have come in contact with a sexual disease, find out if you are to be tested again. If your caregiver is concerned about the HIV/AIDS virus, he/she may require you to have continued testing for several months.  For the protection of your privacy, test results can not be given over the phone. Make sure you receive the results of your test. If your test results are not back during your visit, make an appointment with your caregiver to find out the results. Do not assume everything is normal if you have not heard from your caregiver or the medical  facility. It is important for you to follow up on all of your test results.  File appropriate papers with authorities. This is important in all assaults, even if it has occurred in a family or by a friend. SEEK MEDICAL CARE IF:  You have new problems because of your injuries.  You have problems that may be because of the medicine you are taking, such as:  Rash.  Itching.  Swelling.  Trouble breathing.  You develop belly (abdominal) pain, feel sick to your stomach (nausea) or are vomiting.  You begin to run a temperature.  You need supportive care or referral to a rape crisis center. These are centers with trained personnel who can help you get through this ordeal. SEEK IMMEDIATE MEDICAL CARE IF:  You are afraid of being threatened, beaten, or abused. In [place] call 911.  You receive new injuries related to abuse.  You develop severe pain in any area injured in the assault or have any change in your condition that concerns you.  You faint or lose consciousness.  You develop chest pain or shortness of breath. Document Released: 10/19/2005 Document Revised: 01/11/2012 Document Reviewed: 06/06/2008 [facility] Patient Information 2015 [place], [place]. This information is not intended to replace advice given to you by your health care provider. Make sure you discuss any questions you have with your health care provider.

## 2014-11-17 NOTE — ED Notes (Signed)
Gave pt crackers. 
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
## 2014-11-21 ENCOUNTER — Ambulatory Visit (INDEPENDENT_AMBULATORY_CARE_PROVIDER_SITE_OTHER): Payer: No Typology Code available for payment source | Admitting: Licensed Clinical Social Worker

## 2014-11-21 DIAGNOSIS — F3181 Bipolar II disorder: Secondary | ICD-10-CM

## 2014-12-03 ENCOUNTER — Ambulatory Visit (INDEPENDENT_AMBULATORY_CARE_PROVIDER_SITE_OTHER): Payer: No Typology Code available for payment source | Admitting: Licensed Clinical Social Worker

## 2014-12-03 DIAGNOSIS — F3181 Bipolar II disorder: Secondary | ICD-10-CM

## 2014-12-17 ENCOUNTER — Ambulatory Visit: Payer: No Typology Code available for payment source | Admitting: Licensed Clinical Social Worker

## 2014-12-24 ENCOUNTER — Ambulatory Visit (INDEPENDENT_AMBULATORY_CARE_PROVIDER_SITE_OTHER): Payer: No Typology Code available for payment source | Admitting: Internal Medicine

## 2014-12-24 ENCOUNTER — Ambulatory Visit (INDEPENDENT_AMBULATORY_CARE_PROVIDER_SITE_OTHER): Payer: No Typology Code available for payment source | Admitting: Licensed Clinical Social Worker

## 2014-12-24 ENCOUNTER — Encounter: Payer: Self-pay | Admitting: Internal Medicine

## 2014-12-24 VITALS — BP 110/74 | HR 67 | Temp 97.6°F | Wt 119.0 lb

## 2014-12-24 DIAGNOSIS — F3181 Bipolar II disorder: Secondary | ICD-10-CM

## 2014-12-24 DIAGNOSIS — Z8669 Personal history of other diseases of the nervous system and sense organs: Secondary | ICD-10-CM

## 2014-12-24 DIAGNOSIS — H6503 Acute serous otitis media, bilateral: Secondary | ICD-10-CM

## 2014-12-24 DIAGNOSIS — F3176 Bipolar disorder, in full remission, most recent episode depressed: Secondary | ICD-10-CM

## 2014-12-24 DIAGNOSIS — J069 Acute upper respiratory infection, unspecified: Secondary | ICD-10-CM

## 2014-12-24 MED ORDER — AZITHROMYCIN 250 MG PO TABS: ORAL_TABLET | ORAL | Status: DC

## 2014-12-24 MED ORDER — LAMOTRIGINE 100 MG PO TABS: 150.0000 mg | ORAL_TABLET | Freq: Two times a day (BID) | ORAL | Status: DC

## 2014-12-24 NOTE — Patient Instructions (Signed)
Lamictal refilled. Take Zithromax Z-PAK as directed. We will refill BuSpar when prescription runs out from Dr. Caprice Beaver.
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
## 2015-01-07 ENCOUNTER — Ambulatory Visit (INDEPENDENT_AMBULATORY_CARE_PROVIDER_SITE_OTHER): Payer: No Typology Code available for payment source | Admitting: Licensed Clinical Social Worker

## 2015-01-07 DIAGNOSIS — F3181 Bipolar II disorder: Secondary | ICD-10-CM | POA: Diagnosis not present

## 2015-01-10 ENCOUNTER — Other Ambulatory Visit: Payer: Self-pay

## 2015-01-10 DIAGNOSIS — Z1231 Encounter for screening mammogram for malignant neoplasm of breast: Secondary | ICD-10-CM

## 2015-01-11 ENCOUNTER — Ambulatory Visit (INDEPENDENT_AMBULATORY_CARE_PROVIDER_SITE_OTHER): Payer: No Typology Code available for payment source | Admitting: Internal Medicine

## 2015-01-11 ENCOUNTER — Encounter: Payer: Self-pay | Admitting: Internal Medicine

## 2015-01-11 VITALS — BP 110/70 | HR 64 | Temp 97.6°F | Wt 117.0 lb

## 2015-01-11 DIAGNOSIS — J069 Acute upper respiratory infection, unspecified: Secondary | ICD-10-CM | POA: Diagnosis not present

## 2015-01-11 LAB — INFLUENZA A AND B
Inflenza A Ag: NEGATIVE
Influenza B Ag: NEGATIVE

## 2015-01-11 MED ORDER — HYDROCODONE-HOMATROPINE 5-1.5 MG/5ML PO SYRP: 5.0000 mL | ORAL_SOLUTION | Freq: Three times a day (TID) | ORAL | Status: DC

## 2015-01-11 MED ORDER — LEVOFLOXACIN 500 MG PO TABS: 500.0000 mg | ORAL_TABLET | Freq: Every day | ORAL | Status: DC

## 2015-01-11 NOTE — Progress Notes (Signed)
   Subjective:    Patient ID: [name] [name], female    DOB: 1961/04/13, 54 y.o.   MRN: [id number]  HPI  She was seen February 22 with an acute respiratory infection and otitis media. She was treated with Zithromax Z-PAK and had Hycodan for cough. Apparently got some better but never completely cleared her cough. Had discolored sputum. No fever or shaking chills. Today she had some chest tightness and discomfort. Has myalgias and feels terrible. Did have flu vaccine earlier this season. No wheezing or shortness of breath. Main complaint is fatigue and myalgias. Slight sore throat.    Review of Systems     Objective:   Physical Exam Pharynx is very slightly injected. Left TM is full but not red. Right TM is clear. Neck is supple without adenopathy. Chest clear to auscultation without rales or wheezing       Assessment & Plan:  Protracted URI  ? Influenza  Plan: Nasal swab to test for influenza. Levaquin 500 milligrams daily for 10 days. Hycodan  4 ounces 1 teaspoon by mouth every 8 hours when necessary cough. Rest and drink plenty of fluids. Tylenol or Advil for myalgias.

## 2015-01-11 NOTE — Patient Instructions (Signed)
Rapid flu test is pending. Take Levaquin 500 milligrams daily for 10 days. Hycodan as needed for cough. Rest and drink plenty of fluids.

## 2015-01-21 ENCOUNTER — Ambulatory Visit (INDEPENDENT_AMBULATORY_CARE_PROVIDER_SITE_OTHER): Payer: No Typology Code available for payment source | Admitting: Licensed Clinical Social Worker

## 2015-01-21 ENCOUNTER — Ambulatory Visit
Admission: RE | Admit: 2015-01-21 | Discharge: 2015-01-21 | Disposition: A | Discharge status: Home / Self Care | Payer: No Typology Code available for payment source | Source: Ambulatory Visit

## 2015-01-21 DIAGNOSIS — Z1231 Encounter for screening mammogram for malignant neoplasm of breast: Secondary | ICD-10-CM

## 2015-01-21 DIAGNOSIS — F3181 Bipolar II disorder: Secondary | ICD-10-CM | POA: Diagnosis not present

## 2015-02-11 ENCOUNTER — Ambulatory Visit (INDEPENDENT_AMBULATORY_CARE_PROVIDER_SITE_OTHER): Payer: No Typology Code available for payment source | Admitting: Licensed Clinical Social Worker

## 2015-02-11 DIAGNOSIS — F3181 Bipolar II disorder: Secondary | ICD-10-CM

## 2015-02-25 ENCOUNTER — Ambulatory Visit: Payer: No Typology Code available for payment source | Admitting: Licensed Clinical Social Worker

## 2015-03-19 ENCOUNTER — Telehealth: Payer: Self-pay | Admitting: Internal Medicine

## 2015-03-19 ENCOUNTER — Ambulatory Visit (INDEPENDENT_AMBULATORY_CARE_PROVIDER_SITE_OTHER): Payer: No Typology Code available for payment source | Admitting: Licensed Clinical Social Worker

## 2015-03-19 DIAGNOSIS — F3181 Bipolar II disorder: Secondary | ICD-10-CM | POA: Diagnosis not present

## 2015-03-19 MED ORDER — BUSPIRONE HCL 30 MG PO TABS: 30.0000 mg | ORAL_TABLET | Freq: Two times a day (BID) | ORAL | Status: DC

## 2015-03-19 NOTE — Telephone Encounter (Signed)
Refill Buspar for 6 months

## 2015-03-19 NOTE — Telephone Encounter (Signed)
Patient is requesting a refill on her Buspar 30 mg.  States that she used to see psychiatrist and no longer is being seen by her.  States that when she seen you last, she didn't need a refill.  Further states that you told her to call us when she needed a refill on the Buspar.    Pharmacy:  [facility] on [address]

## 2015-03-19 NOTE — Telephone Encounter (Signed)
buspar refilled

## 2015-04-22 ENCOUNTER — Ambulatory Visit (INDEPENDENT_AMBULATORY_CARE_PROVIDER_SITE_OTHER): Payer: No Typology Code available for payment source | Admitting: Licensed Clinical Social Worker

## 2015-04-22 DIAGNOSIS — F3181 Bipolar II disorder: Secondary | ICD-10-CM | POA: Diagnosis not present

## 2015-05-01 ENCOUNTER — Emergency Department (INDEPENDENT_AMBULATORY_CARE_PROVIDER_SITE_OTHER): Payer: No Typology Code available for payment source

## 2015-05-01 ENCOUNTER — Encounter (HOSPITAL_COMMUNITY): Payer: Self-pay | Admitting: Emergency Medicine

## 2015-05-01 ENCOUNTER — Emergency Department (INDEPENDENT_AMBULATORY_CARE_PROVIDER_SITE_OTHER)
Admission: EM | Admit: 2015-05-01 | Discharge: 2015-05-01 | Disposition: A | Discharge status: Home / Self Care | Payer: No Typology Code available for payment source | Source: Home / Self Care | Attending: Emergency Medicine | Admitting: Emergency Medicine

## 2015-05-01 DIAGNOSIS — R0781 Pleurodynia: Secondary | ICD-10-CM | POA: Diagnosis not present

## 2015-05-01 DIAGNOSIS — K5904 Chronic idiopathic constipation: Secondary | ICD-10-CM

## 2015-05-01 DIAGNOSIS — K5909 Other constipation: Secondary | ICD-10-CM | POA: Diagnosis not present

## 2015-05-01 MED ORDER — CYCLOBENZAPRINE HCL 5 MG PO TABS: 5.0000 mg | ORAL_TABLET | Freq: Every day | ORAL | Status: DC

## 2015-05-01 MED ORDER — PEG 3350-KCL-NABCB-NACL-NASULF 236 G PO SOLR: ORAL | Status: DC

## 2015-05-01 NOTE — Discharge Instructions (Signed)
The pressure and sensation you have may be due to some backed up stool. Take the GoLYTELY as prescribed.  The side pain is coming from your rib cage. Take Flexeril at bedtime for the next several days. If the medicine is not helping after 5 days, you can stop it.  Please follow-up with your primary care physician in the next few weeks. I think your thyroid needs to be rechecked.

## 2015-05-01 NOTE — ED Provider Notes (Signed)
CSN: [id number]     Arrival date & time 05/01/15  1302 History   First MD Initiated Contact with Patient 05/01/15 1328     Chief Complaint  Patient presents with  . Abdominal Pain   (Consider location/radiation/quality/duration/timing/severity/associated sxs/prior Treatment) HPI  She is a 54 year old woman here for evaluation of abdominal pain. She states she has been struggling with intermittent constipation for the last year or so. She reports normal bowel movements the last few days. Yesterday, she developed a pressure sensation in the lower abdomen and rectal area. She tried using the restroom, but did not have a bowel movement. She did see some pink tinged mucus yesterday. She also reports a sensation of something moving in her rectum when sitting on the commode. She denies any bulging or itching. No vomiting, but does report some mild nausea. She also reports sharp left sided pains. She reports chronic discomfort on awakening in her left side, but last night started having sharp pains. She has continued to have these today, sometimes associated with deep breaths. No injury or trauma. She is not on any new medications.  Past Medical History  Diagnosis Date  . Bipolar disorder   . Migraine   . Helicobacter pylori (H. pylori) 07/2010  . Anxiety    Past Surgical History  Procedure Laterality Date  . Cervical fusion    . Tubal ligation    . Upper gastrointestinal endoscopy    . Abdominal hysterectomy  2001    USO by history-sono2011 question of both ovaries present   Family History  Problem Relation Age of Onset  . Hypertension Mother   . Hypertension Brother    History  Substance Use Topics  . Smoking status: Former Research scientist (life sciences)  . Smokeless tobacco: Never Used  . Alcohol Use: No   OB History    Gravida Para Term Preterm AB TAB SAB Ectopic Multiple Living   4 3   1  1   3      Review of Systems  As in history of present illness  Allergies  Review of patient's allergies  indicates no known allergies.  Home Medications   Prior to Admission medications   Medication Sig Start Date End Date Taking? Authorizing Provider  busPIRone (BUSPAR) 30 MG tablet Take 1 tablet (30 mg total) by mouth 2 (two) times daily. 03/19/15   Elby Showers, MD  cyclobenzaprine (FLEXERIL) 5 MG tablet Take 1 tablet (5 mg total) by mouth at bedtime. 05/01/15   Melony Overly, MD  HYDROcodone-homatropine (HYCODAN) 5-1.5 MG/5ML syrup Take 5 mLs by mouth every 8 (eight) hours. 01/11/15   Elby Showers, MD  ibuprofen (ADVIL,MOTRIN) 600 MG tablet Take 1 tablet (600 mg total) by mouth every 8 (eight) hours as needed for pain. 01/19/13   Adlih Moreno-Coll, MD  lamoTRIgine (LAMICTAL) 100 MG tablet Take 1.5 tablets (150 mg total) by mouth 2 (two) times daily. Take 1 tablet every morning and take 1.5 tablets at night. 12/24/14   Elby Showers, MD  levofloxacin (LEVAQUIN) 500 MG tablet Take 1 tablet (500 mg total) by mouth daily. 01/11/15   Elby Showers, MD  MINIVELLE 0.1 MG/24HR patch APPLY 1 PATCH TO THE SKIN TWICE WEEKLY 10/29/14   Anastasio Auerbach, MD  Multiple Vitamin (MULTIVITAMIN) capsule Take 1 capsule by mouth daily.      Historical Provider, MD  polyethylene glycol (GOLYTELY) 236 G solution Drink 8 ounces every 10 minutes until gone, or diarrhea is clear. 05/01/15   Junie Panning  Earlene Plater, MD  topiramate (TOPAMAX) 200 MG tablet Take 400 mg by mouth daily.     Historical Provider, MD   BP 114/77 mmHg  Pulse 71  Temp(Src) 98.8 F (37.1 C) (Oral)  Resp 16  SpO2 98%  LMP 11/02/2000 Physical Exam  Constitutional: She is oriented to person, place, and time. She appears well-developed and well-nourished. No distress.  Neck: Neck supple.  Cardiovascular: Normal rate, regular rhythm and normal heart sounds.   No murmur heard. Pulmonary/Chest: Effort normal and breath sounds normal. No respiratory distress. She has no wheezes. She has no rales.  Abdominal: Soft. Bowel sounds are normal. She exhibits no  distension. There is no tenderness. There is no rebound and no guarding.    She is tender to palpation over the left inferior ribs.  Genitourinary: Rectum normal. Rectal exam shows no external hemorrhoid, no internal hemorrhoid, no fissure, no mass, no tenderness and anal tone normal.  Neurological: She is alert and oriented to person, place, and time.    ED Course  Procedures (including critical care time) Labs Review Labs Reviewed - No data to display  Imaging Review Dg Abd 1 View  05/01/2015   CLINICAL DATA:  Left sided lower abdominal pain beginning last night. Personal history of constipation.  EXAM: ABDOMEN - 1 VIEW  COMPARISON:  02/09/2013  FINDINGS: No evidence of ileus or obstruction. Moderate amount of fecal matter within the colon, within normal limits. Norris and calcifications are bony findings. Mild scoliosis.  IMPRESSION: No significant plain radiographic finding. Moderate amount of fecal matter, but within the range that can be seen in normal individuals.   Electronically Signed   By: Nelson Chimes M.D.   On: 05/01/2015 14:11     MDM   1. Functional constipation   2. Rib pain on left side    We'll treat with GoLYTELY to clean out her colon to see if this resolves her symptoms. Will treat rib pain with Flexeril to see if that helps. Follow-up with PCP in the next few weeks for recheck. She likely needs her thyroid checked again.    Melony Overly, MD 05/01/15 [phone]

## 2015-05-01 NOTE — ED Notes (Signed)
C/o abd pain States pain is on left side States pain woke her up out of her sleep States she has dry mouth, nausea, and tired  Does have hx of constipation Has had a normal bowel movement yesterday with a little blood in stool Does take metamucil daily and miralax as needed

## 2015-05-20 ENCOUNTER — Ambulatory Visit: Payer: No Typology Code available for payment source | Admitting: Internal Medicine

## 2015-05-20 ENCOUNTER — Ambulatory Visit: Payer: No Typology Code available for payment source | Admitting: Licensed Clinical Social Worker

## 2015-07-01 ENCOUNTER — Encounter (HOSPITAL_COMMUNITY): Payer: Self-pay | Admitting: Emergency Medicine

## 2015-07-01 ENCOUNTER — Emergency Department (INDEPENDENT_AMBULATORY_CARE_PROVIDER_SITE_OTHER)
Admission: EM | Admit: 2015-07-01 | Discharge: 2015-07-01 | Disposition: A | Discharge status: Home / Self Care | Payer: No Typology Code available for payment source | Source: Home / Self Care | Attending: Family Medicine | Admitting: Family Medicine

## 2015-07-01 DIAGNOSIS — N9489 Other specified conditions associated with female genital organs and menstrual cycle: Secondary | ICD-10-CM

## 2015-07-01 DIAGNOSIS — N3090 Cystitis, unspecified without hematuria: Secondary | ICD-10-CM

## 2015-07-01 DIAGNOSIS — R102 Pelvic and perineal pain: Secondary | ICD-10-CM

## 2015-07-01 LAB — POCT URINALYSIS DIP (DEVICE)
Bilirubin Urine: NEGATIVE
Glucose, UA: NEGATIVE mg/dL
Ketones, ur: NEGATIVE mg/dL
LEUKOCYTES UA: NEGATIVE
Nitrite: NEGATIVE
PROTEIN: NEGATIVE mg/dL
Specific Gravity, Urine: 1.015 (ref 1.005–1.030)
UROBILINOGEN UA: 0.2 mg/dL (ref 0.0–1.0)
pH: 7 (ref 5.0–8.0)

## 2015-07-01 MED ORDER — CEPHALEXIN 500 MG PO CAPS: 500.0000 mg | ORAL_CAPSULE | Freq: Four times a day (QID) | ORAL | Status: DC

## 2015-07-01 NOTE — ED Provider Notes (Signed)
CSN: [id number]     Arrival date & time 07/01/15  68 History   First MD Initiated Contact with Patient 07/01/15 1841     Chief Complaint  Patient presents with  . Dysuria   (Consider location/radiation/quality/duration/timing/severity/associated sxs/prior Treatment) HPI Comments: 54 year old female complaining of pressure with voiding. It is associated with urinary frequency and burning after she has completed urination. She states her stream is normal. No hesitancy and no urgency. The symptoms began approximately 2-3 weeks ago. Just before she urinates she feels there is a pressure coming down from the abdomen onto her pelvis. No fever.  She has a history of a ruptured bladder from several years ago when she delivered an infant. Patient is a 55 y.o. female presenting with dysuria.  Dysuria Pain quality:  Burning Pain severity:  Moderate Onset quality:  Sudden Duration:  3 weeks Timing:  Intermittent Progression:  Worsening Chronicity:  New Relieved by:  Nothing Associated symptoms: no abdominal pain, no fever, no flank pain, no nausea, no vaginal discharge and no vomiting     Past Medical History  Diagnosis Date  . Bipolar disorder   . Migraine   . Helicobacter pylori (H. pylori) 07/2010  . Anxiety    Past Surgical History  Procedure Laterality Date  . Cervical fusion    . Tubal ligation    . Upper gastrointestinal endoscopy    . Abdominal hysterectomy  2001    USO by history-sono2011 question of both ovaries present   Family History  Problem Relation Age of Onset  . Hypertension Mother   . Hypertension Brother    Social History  Substance Use Topics  . Smoking status: Former Research scientist (life sciences)  . Smokeless tobacco: Never Used  . Alcohol Use: No   OB History    Gravida Para Term Preterm AB TAB SAB Ectopic Multiple Living   4 3   1  1   3      Review of Systems  Constitutional: Negative for fever, activity change and fatigue.  Respiratory: Negative.   Cardiovascular:  Negative for leg swelling.  Gastrointestinal: Positive for constipation. Negative for nausea, vomiting, abdominal pain and diarrhea.  Genitourinary: Positive for frequency and pelvic pain. Negative for dysuria, urgency, hematuria, flank pain, decreased urine volume, vaginal bleeding, vaginal discharge, genital sores, vaginal pain and menstrual problem.       Patient states that she has a scant clear discharge that she considers physiologic.    Allergies  Review of patient's allergies indicates no known allergies.  Home Medications   Prior to Admission medications   Medication Sig Start Date End Date Taking? Authorizing Provider  busPIRone (BUSPAR) 30 MG tablet Take 1 tablet (30 mg total) by mouth 2 (two) times daily. 03/19/15  Yes Elby Showers, MD  lamoTRIgine (LAMICTAL) 100 MG tablet Take 1.5 tablets (150 mg total) by mouth 2 (two) times daily. Take 1 tablet every morning and take 1.5 tablets at night. 12/24/14  Yes Elby Showers, MD  MINIVELLE 0.1 MG/24HR patch APPLY 1 PATCH TO THE SKIN TWICE WEEKLY 10/29/14  Yes Anastasio Auerbach, MD  topiramate (TOPAMAX) 200 MG tablet Take 400 mg by mouth daily.    Yes Historical Provider, MD  cephALEXin (KEFLEX) 500 MG capsule Take 1 capsule (500 mg total) by mouth 4 (four) times daily. 07/01/15   Janne Napoleon, NP  cyclobenzaprine (FLEXERIL) 5 MG tablet Take 1 tablet (5 mg total) by mouth at bedtime. 05/01/15   Melony Overly, MD  HYDROcodone-homatropine [facility]) 5-1.5  MG/5ML syrup Take 5 mLs by mouth every 8 (eight) hours. 01/11/15   Elby Showers, MD  ibuprofen (ADVIL,MOTRIN) 600 MG tablet Take 1 tablet (600 mg total) by mouth every 8 (eight) hours as needed for pain. 01/19/13   Adlih Moreno-Coll, MD  levofloxacin (LEVAQUIN) 500 MG tablet Take 1 tablet (500 mg total) by mouth daily. 01/11/15   Elby Showers, MD  Multiple Vitamin (MULTIVITAMIN) capsule Take 1 capsule by mouth daily.      Historical Provider, MD  polyethylene glycol (GOLYTELY) 236 G solution  Drink 8 ounces every 10 minutes until gone, or diarrhea is clear. 05/01/15   Melony Overly, MD   Meds Ordered and Administered this Visit  Medications - No data to display  BP 148/72 mmHg  Pulse 62  Temp(Src) 98 F (36.7 C) (Oral)  Resp 14  SpO2 100%  LMP 11/02/2000 No data found.   Physical Exam  Constitutional: She is oriented to person, place, and time. She appears well-developed and well-nourished. No distress.  Eyes: EOM are normal.  Neck: Normal range of motion. Neck supple.  Cardiovascular: Normal rate.   Pulmonary/Chest: Effort normal. No respiratory distress.  Abdominal: Soft. Bowel sounds are normal. She exhibits no distension and no mass. There is no rebound and no guarding.  Upper abdomen percusses tympanic.  Palpation over the suprapubic area reveals mild tenderness over the mid suprapubic area and mild to moderate tenderness over the right suprapubic area. No palpable masses.  No right lower quadrant tenderness.   Musculoskeletal: She exhibits no edema.  Neurological: She is alert and oriented to person, place, and time. She exhibits normal muscle tone.  Skin: Skin is warm and dry.  Psychiatric: She has a normal mood and affect.  Nursing note and vitals reviewed.   ED Course  Procedures (including critical care time)  Labs Review Labs Reviewed  POCT URINALYSIS DIP (DEVICE) - Abnormal; Notable for the following:    Hgb urine dipstick TRACE (*)    All other components within normal limits  URINE CULTURE   Results for orders placed or performed during the hospital encounter of 07/01/15  POCT urinalysis dip (device)  Result Value Ref Range   Glucose, UA NEGATIVE NEGATIVE mg/dL   Bilirubin Urine NEGATIVE NEGATIVE   Ketones, ur NEGATIVE NEGATIVE mg/dL   Specific Gravity, Urine 1.015 1.005 - 1.030   Hgb urine dipstick TRACE (A) NEGATIVE   pH 7.0 5.0 - 8.0   Protein, ur NEGATIVE NEGATIVE mg/dL   Urobilinogen, UA 0.2 0.0 - 1.0 mg/dL   Nitrite NEGATIVE  NEGATIVE   Leukocytes, UA NEGATIVE NEGATIVE    Imaging Review No results found.   Visual Acuity Review  Right Eye Distance:   Left Eye Distance:   Bilateral Distance:    Right Eye Near:   Left Eye Near:    Bilateral Near:         MDM   1. Cystitis   2. Pelvic pressure in female    patient has a history of present illness of bladder pressure and other urinary symptoms. The urine is negative. Suspect she does have a cystitis or UTI. Not certain what the tenderness over the right pelvis may be however the patient is not complaining of pain there and there are no palpable masses. She is to keep her appointment with her PCP and 7 days and if there is any worsening go to the [facility]. Keflex as dir. Lots of water/fluids For worsening go to the [facility]  hosp    Janne Napoleon, NP 07/01/15 1949  Janne Napoleon, NP 07/01/15 1950

## 2015-07-01 NOTE — ED Notes (Signed)
C/o dysuria onset 2 weeks associated w/lower abd pressure Denies hematuria, fevers, chills Alert... No acute distress.

## 2015-07-01 NOTE — Discharge Instructions (Signed)
Urinary Tract Infection Urinary tract infections (UTIs) can develop anywhere along your urinary tract. Your urinary tract is your body's drainage system for removing wastes and extra water. Your urinary tract includes two kidneys, two ureters, a bladder, and a urethra. Your kidneys are a pair of bean-shaped organs. Each kidney is about the size of your fist. They are located below your ribs, one on each side of your spine. CAUSES Infections are caused by microbes, which are microscopic organisms, including fungi, viruses, and bacteria. These organisms are so small that they can only be seen through a microscope. Bacteria are the microbes that most commonly cause UTIs. SYMPTOMS  Symptoms of UTIs may vary by age and gender of the patient and by the location of the infection. Symptoms in young women typically include a frequent and intense urge to urinate and a painful, burning feeling in the bladder or urethra during urination. Older women and men are more likely to be tired, shaky, and weak and have muscle aches and abdominal pain. A fever may mean the infection is in your kidneys. Other symptoms of a kidney infection include pain in your back or sides below the ribs, nausea, and vomiting. DIAGNOSIS To diagnose a UTI, your caregiver will ask you about your symptoms. Your caregiver also will ask to provide a urine sample. The urine sample will be tested for bacteria and white blood cells. White blood cells are made by your body to help fight infection. TREATMENT  Typically, UTIs can be treated with medication. Because most UTIs are caused by a bacterial infection, they usually can be treated with the use of antibiotics. The choice of antibiotic and length of treatment depend on your symptoms and the type of bacteria causing your infection. HOME CARE INSTRUCTIONS  If you were prescribed antibiotics, take them exactly as your caregiver instructs you. Finish the medication even if you feel better after you  have only taken some of the medication.  Drink enough water and fluids to keep your urine clear or pale yellow.  Avoid caffeine, tea, and carbonated beverages. They tend to irritate your bladder.  Empty your bladder often. Avoid holding urine for long periods of time.  Empty your bladder before and after sexual intercourse.  After a bowel movement, women should cleanse from front to back. Use each tissue only once. SEEK MEDICAL CARE IF:   You have back pain.  You develop a fever.  Your symptoms do not begin to resolve within 3 days. SEEK IMMEDIATE MEDICAL CARE IF:   You have severe back pain or lower abdominal pain.  You develop chills.  You have nausea or vomiting.  You have continued burning or discomfort with urination. MAKE SURE YOU:   Understand these instructions.  Will watch your condition.  Will get help right away if you are not doing well or get worse. Document Released: 07/29/2005 Document Revised: 04/19/2012 Document Reviewed: 11/27/2011 [facility] Patient Information 2015 [place], [place]. This information is not intended to replace advice given to you by your health care provider. Make sure you discuss any questions you have with your health care provider.  Pelvic Pain Female pelvic pain can be caused by many different things and start from a variety of places. Pelvic pain refers to pain that is located in the lower half of the abdomen and between your hips. The pain may occur over a short period of time (acute) or may be reoccurring (chronic). The cause of pelvic pain may be related to disorders affecting the  female reproductive organs (gynecologic), but it may also be related to the bladder, kidney stones, an intestinal complication, or muscle or skeletal problems. Getting help right away for pelvic pain is important, especially if there has been severe, sharp, or a sudden onset of unusual pain. It is also important to get help right away because some types of  pelvic pain can be life threatening.  CAUSES  Below are only some of the causes of pelvic pain. The causes of pelvic pain can be in one of several categories.   Gynecologic.  Pelvic inflammatory disease.  Sexually transmitted infection.  Ovarian cyst or a twisted ovarian ligament (ovarian torsion).  Uterine lining that grows outside the uterus (endometriosis).  Fibroids, cysts, or tumors.  Ovulation.  Pregnancy.  Pregnancy that occurs outside the uterus (ectopic pregnancy).  Miscarriage.  Labor.  Abruption of the placenta or ruptured uterus.  Infection.  Uterine infection (endometritis).  Bladder infection.  Diverticulitis.  Miscarriage related to a uterine infection (septic abortion).  Bladder.  Inflammation of the bladder (cystitis).  Kidney stone(s).  Gastrointestinal.  Constipation.  Diverticulitis.  Neurologic.  Trauma.  Feeling pelvic pain because of mental or emotional causes (psychosomatic).  Cancers of the bowel or pelvis. EVALUATION  Your caregiver will want to take a careful history of your concerns. This includes recent changes in your health, a careful gynecologic history of your periods (menses), and a sexual history. Obtaining your family history and medical history is also important. Your caregiver may suggest a pelvic exam. A pelvic exam will help identify the location and severity of the pain. It also helps in the evaluation of which organ system may be involved. In order to identify the cause of the pelvic pain and be properly treated, your caregiver may order tests. These tests may include:   A pregnancy test.  Pelvic ultrasonography.  An X-ray exam of the abdomen.  A urinalysis or evaluation of vaginal discharge.  Blood tests. HOME CARE INSTRUCTIONS   Only take over-the-counter or prescription medicines for pain, discomfort, or fever as directed by your caregiver.   Rest as directed by your caregiver.   Eat a balanced  diet.   Drink enough fluids to make your urine clear or pale yellow, or as directed.   Avoid sexual intercourse if it causes pain.   Apply warm or cold compresses to the lower abdomen depending on which one helps the pain.   Avoid stressful situations.   Keep a journal of your pelvic pain. Write down when it started, where the pain is located, and if there are things that seem to be associated with the pain, such as food or your menstrual cycle.  Follow up with your caregiver as directed.  SEEK MEDICAL CARE IF:  Your medicine does not help your pain.  You have abnormal vaginal discharge. SEEK IMMEDIATE MEDICAL CARE IF:   You have heavy bleeding from the vagina.   Your pelvic pain increases.   You feel light-headed or faint.   You have chills.   You have pain with urination or blood in your urine.   You have uncontrolled diarrhea or vomiting.   You have a fever or persistent symptoms for more than 3 days.  You have a fever and your symptoms suddenly get worse.   You are being physically or sexually abused.  MAKE SURE YOU:  Understand these instructions.  Will watch your condition.  Will get help if you are not doing well or get worse. Document Released: 09/15/2004  Document Revised: 03/05/2014 Document Reviewed: 02/08/2012 [facility] Patient Information 2015 [employer], [place]. This information is not intended to replace advice given to you by your health care provider. Make sure you discuss any questions you have with your health care provider.

## 2015-07-03 ENCOUNTER — Ambulatory Visit (INDEPENDENT_AMBULATORY_CARE_PROVIDER_SITE_OTHER): Payer: No Typology Code available for payment source | Admitting: Licensed Clinical Social Worker

## 2015-07-03 DIAGNOSIS — F3181 Bipolar II disorder: Secondary | ICD-10-CM | POA: Diagnosis not present

## 2015-07-03 LAB — URINE CULTURE: Special Requests: NORMAL

## 2015-07-04 ENCOUNTER — Other Ambulatory Visit: Payer: Self-pay | Admitting: Internal Medicine

## 2015-07-04 ENCOUNTER — Other Ambulatory Visit: Payer: Self-pay | Admitting: *Deleted

## 2015-07-04 MED ORDER — LAMOTRIGINE 100 MG PO TABS: ORAL_TABLET | ORAL | Status: DC

## 2015-07-04 NOTE — Telephone Encounter (Signed)
Refill once. Needs PE without Pap

## 2015-07-04 NOTE — Telephone Encounter (Signed)
Lamictal RX clarified and resent to patient pharmacy

## 2015-07-16 NOTE — ED Notes (Signed)
Result shows multiple species, no further action required for negative report

## 2015-07-24 ENCOUNTER — Ambulatory Visit (INDEPENDENT_AMBULATORY_CARE_PROVIDER_SITE_OTHER): Payer: No Typology Code available for payment source | Admitting: Licensed Clinical Social Worker

## 2015-07-24 DIAGNOSIS — F3181 Bipolar II disorder: Secondary | ICD-10-CM

## 2015-08-01 ENCOUNTER — Other Ambulatory Visit: Payer: Self-pay | Admitting: Internal Medicine

## 2015-08-05 ENCOUNTER — Encounter: Payer: Self-pay | Admitting: Internal Medicine

## 2015-08-05 ENCOUNTER — Ambulatory Visit (INDEPENDENT_AMBULATORY_CARE_PROVIDER_SITE_OTHER): Payer: No Typology Code available for payment source | Admitting: Internal Medicine

## 2015-08-05 VITALS — BP 106/74 | HR 67 | Temp 98.2°F | Ht 64.0 in | Wt 123.0 lb

## 2015-08-05 DIAGNOSIS — R1012 Left upper quadrant pain: Secondary | ICD-10-CM

## 2015-08-05 DIAGNOSIS — K5900 Constipation, unspecified: Secondary | ICD-10-CM | POA: Diagnosis not present

## 2015-08-05 DIAGNOSIS — R0789 Other chest pain: Secondary | ICD-10-CM | POA: Diagnosis not present

## 2015-08-05 DIAGNOSIS — Z23 Encounter for immunization: Secondary | ICD-10-CM

## 2015-08-05 DIAGNOSIS — B373 Candidiasis of vulva and vagina: Secondary | ICD-10-CM

## 2015-08-05 DIAGNOSIS — B3731 Acute candidiasis of vulva and vagina: Secondary | ICD-10-CM

## 2015-08-05 DIAGNOSIS — K5904 Chronic idiopathic constipation: Secondary | ICD-10-CM | POA: Insufficient documentation

## 2015-08-05 MED ORDER — FLUCONAZOLE 150 MG PO TABS: ORAL_TABLET | ORAL | Status: DC

## 2015-08-05 NOTE — Patient Instructions (Addendum)
Take Senokot 2 tablets at bedtime for constipation. Then start MiraLAX 1 capful in 8 ounces of water daily. This will help functional constipation. May take ibuprofen for musculoskeletal pain left rib cage area. Diflucan for yeast infection. Consider repeat colonoscopy per  gastroenterologist since was last done in 2011 and she's now over 14. Flu vaccine given.

## 2015-08-05 NOTE — Progress Notes (Signed)
   Subjective:    Patient ID: [name] [name], female    DOB: 1961/04/13, 54 y.o.   MRN: [id number]  HPI Patient was seen at ED 05/01/2015 complaining of some  abdominal pain. One view abdominal for am showed constipation. Subsequently patient was seen again in the ED August 29 complaining of dysuria. Urine culture grew multiple species. Patient was treated with antibody and subsequently has developed Candida vaginitis. She has vaginal itching and some discharge. Still having issues with abdominal pain. One week she is constipated the next week she will go on a daily basis. Says it's been this way for several months. Also having some left rib cage pain. She does clean houses for a living but denies that she has had any injury to her left rib cage area or significant exertion or heavy lifting.    Review of Systems     Objective:   Physical Exam She is tender in her left lateral rib cage area axillary line. There is actually point tenderness along the lower rib. Chest is clear. Abdomen slightly distended but no hepatosplenomegaly masses but has tenderness left upper and lower abdomen. Bimanual is normal. Increased bowel sounds left lower quadrant. No stool to guaiac       Assessment & Plan:  Left abdominal pain-likely functional constipation. Recommend Senokot 2 tablets at bedtime then start MiraLAX daily. Now that she is over 51 needs repeat screening colonoscopy.  Dr. Collene Mares did colonoscopynn in 2011 for similar issue.See report an Epic. Subsequently after that she saw Dr. Ardis Hughs for epigastric pain and was diagnosed by endoscopy with gastritis.   Left rib cage pain-take ibuprofen and may use warm hot compresses to rib cage area.  Candida vaginitis Diflucan prescribed. To have GYN exam in the next month per GYN physician.  History of bipolar disorder. Lamictal refilled. Needs to find psychiatrist. Dr. Arvil Persons office no longer accepts her insurance.  Health maintenance-flu vaccine  given today

## 2015-08-12 ENCOUNTER — Ambulatory Visit (INDEPENDENT_AMBULATORY_CARE_PROVIDER_SITE_OTHER): Payer: No Typology Code available for payment source | Admitting: Licensed Clinical Social Worker

## 2015-08-12 DIAGNOSIS — F3181 Bipolar II disorder: Secondary | ICD-10-CM

## 2015-08-30 ENCOUNTER — Other Ambulatory Visit: Payer: Self-pay | Admitting: Internal Medicine

## 2015-08-30 ENCOUNTER — Other Ambulatory Visit: Payer: Self-pay

## 2015-08-30 NOTE — Telephone Encounter (Signed)
After reviewing chart and finding documented in note patient is taking 150mg  BID. Called in script with #60 and 5 refill. Verbal order given by Dr. Renold Genta

## 2015-10-07 ENCOUNTER — Ambulatory Visit (INDEPENDENT_AMBULATORY_CARE_PROVIDER_SITE_OTHER): Payer: No Typology Code available for payment source | Admitting: Licensed Clinical Social Worker

## 2015-10-07 DIAGNOSIS — F3181 Bipolar II disorder: Secondary | ICD-10-CM

## 2015-11-09 ENCOUNTER — Other Ambulatory Visit: Payer: Self-pay | Admitting: Gynecology

## 2015-11-11 ENCOUNTER — Other Ambulatory Visit: Payer: Self-pay | Admitting: Internal Medicine

## 2015-11-11 ENCOUNTER — Ambulatory Visit: Payer: No Typology Code available for payment source | Admitting: Licensed Clinical Social Worker

## 2015-11-13 ENCOUNTER — Ambulatory Visit (INDEPENDENT_AMBULATORY_CARE_PROVIDER_SITE_OTHER): Payer: No Typology Code available for payment source | Admitting: Gynecology

## 2015-11-13 ENCOUNTER — Encounter: Payer: Self-pay | Admitting: Gynecology

## 2015-11-13 VITALS — BP 120/74 | Ht 61.0 in | Wt 125.0 lb

## 2015-11-13 DIAGNOSIS — Z01419 Encounter for gynecological examination (general) (routine) without abnormal findings: Secondary | ICD-10-CM

## 2015-11-13 DIAGNOSIS — N952 Postmenopausal atrophic vaginitis: Secondary | ICD-10-CM

## 2015-11-13 DIAGNOSIS — Z79890 Hormone replacement therapy (postmenopausal): Secondary | ICD-10-CM

## 2015-11-13 MED ORDER — ESTRADIOL 0.05 MG/24HR TD PTTW: 1.0000 | MEDICATED_PATCH | TRANSDERMAL | Status: DC

## 2015-11-13 NOTE — Patient Instructions (Signed)
Try the lower dose estrogen patch. Call me if you start to have any significant symptoms otherwise we will keep you on the lower dose this year.  Follow up with your primary physician for routine blood work and annual exam  You may obtain a copy of any labs that were done today by logging onto MyChart as outlined in the instructions provided with your AVS (after visit summary). The office will not call with normal lab results but certainly if there are any significant abnormalities then we will contact you.   Health Maintenance Adopting a healthy lifestyle and getting preventive care can go a long way to promote health and wellness. Talk with your health care provider about what schedule of regular examinations is right for you. This is a good chance for you to check in with your provider about disease prevention and staying healthy. In between checkups, there are plenty of things you can do on your own. Experts have done a lot of research about which lifestyle changes and preventive measures are most likely to keep you healthy. Ask your health care provider for more information. WEIGHT AND DIET  Eat a healthy diet  Be sure to include plenty of vegetables, fruits, low-fat dairy products, and lean protein.  Do not eat a lot of foods high in solid fats, added sugars, or salt.  Get regular exercise. This is one of the most important things you can do for your health.  Most adults should exercise for at least 150 minutes each week. The exercise should increase your heart rate and make you sweat (moderate-intensity exercise).  Most adults should also do strengthening exercises at least twice a week. This is in addition to the moderate-intensity exercise.  Maintain a healthy weight  Body mass index (BMI) is a measurement that can be used to identify possible weight problems. It estimates body fat based on height and weight. Your health care provider can help determine your BMI and help you achieve  or maintain a healthy weight.  For females 36 years of age and older:   A BMI below 18.5 is considered underweight.  A BMI of 18.5 to 24.9 is normal.  A BMI of 25 to 29.9 is considered overweight.  A BMI of 30 and above is considered obese.  Watch levels of cholesterol and blood lipids  You should start having your blood tested for lipids and cholesterol at 54 years of age, then have this test every 5 years.  You may need to have your cholesterol levels checked more often if:  Your lipid or cholesterol levels are high.  You are older than 54 years of age.  You are at high risk for heart disease.  CANCER SCREENING   Lung Cancer  Lung cancer screening is recommended for adults 42-65 years old who are at high risk for lung cancer because of a history of smoking.  A yearly low-dose CT scan of the lungs is recommended for people who:  Currently smoke.  Have quit within the past 15 years.  Have at least a 30-pack-year history of smoking. A pack year is smoking an average of one pack of cigarettes a day for 1 year.  Yearly screening should continue until it has been 15 years since you quit.  Yearly screening should stop if you develop a health problem that would prevent you from having lung cancer treatment.  Breast Cancer  Practice breast self-awareness. This means understanding how your breasts normally appear and feel.  It also  means doing regular breast self-exams. Let your health care provider know about any changes, no matter how small.  If you are in your 20s or 30s, you should have a clinical breast exam (CBE) by a health care provider every 1-3 years as part of a regular health exam.  If you are 5 or older, have a CBE every year. Also consider having a breast X-ray (mammogram) every year.  If you have a family history of breast cancer, talk to your health care provider about genetic screening.  If you are at high risk for breast cancer, talk to your health  care provider about having an MRI and a mammogram every year.  Breast cancer gene (BRCA) assessment is recommended for women who have family members with BRCA-related cancers. BRCA-related cancers include:  Breast.  Ovarian.  Tubal.  Peritoneal cancers.  Results of the assessment will determine the need for genetic counseling and BRCA1 and BRCA2 testing. Cervical Cancer Routine pelvic examinations to screen for cervical cancer are no longer recommended for nonpregnant women who are considered low risk for cancer of the pelvic organs (ovaries, uterus, and vagina) and who do not have symptoms. A pelvic examination may be necessary if you have symptoms including those associated with pelvic infections. Ask your health care provider if a screening pelvic exam is right for you.   The Pap test is the screening test for cervical cancer for women who are considered at risk.  If you had a hysterectomy for a problem that was not cancer or a condition that could lead to cancer, then you no longer need Pap tests.  If you are older than 65 years, and you have had normal Pap tests for the past 10 years, you no longer need to have Pap tests.  If you have had past treatment for cervical cancer or a condition that could lead to cancer, you need Pap tests and screening for cancer for at least 20 years after your treatment.  If you no longer get a Pap test, assess your risk factors if they change (such as having a new sexual partner). This can affect whether you should start being screened again.  Some women have medical problems that increase their chance of getting cervical cancer. If this is the case for you, your health care provider may recommend more frequent screening and Pap tests.  The human papillomavirus (HPV) test is another test that may be used for cervical cancer screening. The HPV test looks for the virus that can cause cell changes in the cervix. The cells collected during the Pap test can  be tested for HPV.  The HPV test can be used to screen women 7 years of age and older. Getting tested for HPV can extend the interval between normal Pap tests from three to five years.  An HPV test also should be used to screen women of any age who have unclear Pap test results.  After 54 years of age, women should have HPV testing as often as Pap tests.  Colorectal Cancer  This type of cancer can be detected and often prevented.  Routine colorectal cancer screening usually begins at 54 years of age and continues through 54 years of age.  Your health care provider may recommend screening at an earlier age if you have risk factors for colon cancer.  Your health care provider may also recommend using home test kits to check for hidden blood in the stool.  A small camera at the end of  a tube can be used to examine your colon directly (sigmoidoscopy or colonoscopy). This is done to check for the earliest forms of colorectal cancer.  Routine screening usually begins at age 22.  Direct examination of the colon should be repeated every 5-10 years through 54 years of age. However, you may need to be screened more often if early forms of precancerous polyps or small growths are found. Skin Cancer  Check your skin from head to toe regularly.  Tell your health care provider about any new moles or changes in moles, especially if there is a change in a mole's shape or color.  Also tell your health care provider if you have a mole that is larger than the size of a pencil eraser.  Always use sunscreen. Apply sunscreen liberally and repeatedly throughout the day.  Protect yourself by wearing long sleeves, pants, a wide-brimmed hat, and sunglasses whenever you are outside. HEART DISEASE, DIABETES, AND HIGH BLOOD PRESSURE   Have your blood pressure checked at least every 1-2 years. High blood pressure causes heart disease and increases the risk of stroke.  If you are between 62 years and 15  years old, ask your health care provider if you should take aspirin to prevent strokes.  Have regular diabetes screenings. This involves taking a blood sample to check your fasting blood sugar level.  If you are at a normal weight and have a low risk for diabetes, have this test once every three years after 54 years of age.  If you are overweight and have a high risk for diabetes, consider being tested at a younger age or more often. PREVENTING INFECTION  Hepatitis B  If you have a higher risk for hepatitis B, you should be screened for this virus. You are considered at high risk for hepatitis B if:  You were born in a country where hepatitis B is common. Ask your health care provider which countries are considered high risk.  Your parents were born in a high-risk country, and you have not been immunized against hepatitis B (hepatitis B vaccine).  You have HIV or AIDS.  You use needles to inject street drugs.  You live with someone who has hepatitis B.  You have had sex with someone who has hepatitis B.  You get hemodialysis treatment.  You take certain medicines for conditions, including cancer, organ transplantation, and autoimmune conditions. Hepatitis C  Blood testing is recommended for:  Everyone born from [birth year] through 1964.  Anyone with known risk factors for hepatitis C. Sexually transmitted infections (STIs)  You should be screened for sexually transmitted infections (STIs) including gonorrhea and chlamydia if:  You are sexually active and are younger than 54 years of age.  You are older than 54 years of age and your health care provider tells you that you are at risk for this type of infection.  Your sexual activity has changed since you were last screened and you are at an increased risk for chlamydia or gonorrhea. Ask your health care provider if you are at risk.  If you do not have HIV, but are at risk, it may be recommended that you take a prescription  medicine daily to prevent HIV infection. This is called pre-exposure prophylaxis (PrEP). You are considered at risk if:  You are sexually active and do not regularly use condoms or know the HIV status of your partner(s).  You take drugs by injection.  You are sexually active with a partner who has HIV. Talk  with your health care provider about whether you are at high risk of being infected with HIV. If you choose to begin PrEP, you should first be tested for HIV. You should then be tested every 3 months for as long as you are taking PrEP.  PREGNANCY   If you are premenopausal and you may become pregnant, ask your health care provider about preconception counseling.  If you may become pregnant, take 400 to 800 micrograms (mcg) of folic acid every day.  If you want to prevent pregnancy, talk to your health care provider about birth control (contraception). OSTEOPOROSIS AND MENOPAUSE   Osteoporosis is a disease in which the bones lose minerals and strength with aging. This can result in serious bone fractures. Your risk for osteoporosis can be identified using a bone density scan.  If you are 34 years of age or older, or if you are at risk for osteoporosis and fractures, ask your health care provider if you should be screened.  Ask your health care provider whether you should take a calcium or vitamin D supplement to lower your risk for osteoporosis.  Menopause may have certain physical symptoms and risks.  Hormone replacement therapy may reduce some of these symptoms and risks. Talk to your health care provider about whether hormone replacement therapy is right for you.  HOME CARE INSTRUCTIONS   Schedule regular health, dental, and eye exams.  Stay current with your immunizations.   Do not use any tobacco products including cigarettes, chewing tobacco, or electronic cigarettes.  If you are pregnant, do not drink alcohol.  If you are breastfeeding, limit how much and how often you  drink alcohol.  Limit alcohol intake to no more than 1 drink per day for nonpregnant women. One drink equals 12 ounces of beer, 5 ounces of wine, or 1 ounces of hard liquor.  Do not use street drugs.  Do not share needles.  Ask your health care provider for help if you need support or information about quitting drugs.  Tell your health care provider if you often feel depressed.  Tell your health care provider if you have ever been abused or do not feel safe at home. Document Released: 05/04/2011 Document Revised: 03/05/2014 Document Reviewed: 09/20/2013 [facility] Patient Information 2015 [place], [place]. This information is not intended to replace advice given to you by your health care provider. Make sure you discuss any questions you have with your health care provider.

## 2015-11-13 NOTE — Progress Notes (Signed)
Vanellope Widing 1961/09/23 [id number]        55 y.o.  G4P0013  for annual exam.  Doing well. Several issues noted below.  Past medical history,surgical history, problem list, medications, allergies, family history and social history were all reviewed and documented as reviewed in the EPIC chart.  ROS:  Performed with pertinent positives and negatives included in the history, assessment and plan.   Additional significant findings :  none   Exam: Caryn Bee assistant Filed Vitals:   11/13/15 0831  BP: 120/74  Height: 5\' 1"  (1.549 m)  Weight: 125 lb (56.7 kg)   General appearance:  Normal affect, orientation and appearance. Skin: Grossly normal HEENT: Without gross lesions.  No cervical or supraclavicular adenopathy. Thyroid normal.  Lungs:  Clear without wheezing, rales or rhonchi Cardiac: RR, without RMG Abdominal:  Soft, nontender, without masses, guarding, rebound, organomegaly or hernia Breasts:  Examined lying and sitting without masses, retractions, discharge or axillary adenopathy. Pelvic:  Ext/BUS/vagina with mild atrophic changes  Adnexa  Without masses or tenderness    Anus and perineum  Normal   Rectovaginal  Normal sphincter tone without palpated masses or tenderness.    Assessment/Plan:  55 y.o. G71P0013 female for annual exam.   1. Postmenopausal/HRT. Patient continues on minivelle 0.1 mg patch doing well and wants to continue.  Status post TVH with questionable USO 2001 but on ultrasound 2011 questionable ovaries bilaterally. I again reviewed the whole issue of ERT and risks to include stroke, heart attack, DVT and possible breast cancer. Recommend that we decrease her dose to 0.5 mg and see how she does with this. Assuming she tolerates this then we'll continue this year and then rediscuss the issues next year. Patient's comfortable with this. Refill 1 year provided. 2. Pap smear 2015.  No Pap smear done today. No history of significant abnormal Pap smears.  Options to stop screening per current screening guidelines as she is status post hysterectomy for benign indications reviewed. Will readdress on an annual basis. 3. Mammography 01/2015. I reminded patient to schedule this coming March. SBE monthly reviewed. 4. Colonoscopy 2011 with reported repeat interval 10 years. 5. DEXA never. Will plan further into the menopause as she is on ERT. 6. Health maintenance. No routine blood work done as patient prefers to follow up with her primary and have it done through their office. Follow up in one year, sooner if any issues with the new ERT dose.   Anastasio Auerbach MD, 8:51 AM 11/13/2015

## 2015-11-14 LAB — URINALYSIS W MICROSCOPIC + REFLEX CULTURE
BACTERIA UA: NONE SEEN [HPF]
BILIRUBIN URINE: NEGATIVE
Casts: NONE SEEN [LPF]
Crystals: NONE SEEN [HPF]
GLUCOSE, UA: NEGATIVE
Hgb urine dipstick: NEGATIVE
KETONES UR: NEGATIVE
Leukocytes, UA: NEGATIVE
NITRITE: NEGATIVE
PH: 7.5 (ref 5.0–8.0)
PROTEIN: NEGATIVE
RBC / HPF: NONE SEEN RBC/HPF (ref <=2)
Specific Gravity, Urine: 1.007 (ref 1.001–1.035)
WBC UA: NONE SEEN WBC/HPF (ref <=5)
YEAST: NONE SEEN [HPF]

## 2015-11-22 ENCOUNTER — Ambulatory Visit (HOSPITAL_COMMUNITY): Payer: Self-pay | Admitting: Psychiatry

## 2015-12-18 ENCOUNTER — Ambulatory Visit (INDEPENDENT_AMBULATORY_CARE_PROVIDER_SITE_OTHER): Payer: No Typology Code available for payment source | Admitting: Licensed Clinical Social Worker

## 2015-12-18 ENCOUNTER — Ambulatory Visit (HOSPITAL_COMMUNITY): Payer: Self-pay | Admitting: Psychiatry

## 2015-12-18 DIAGNOSIS — F3181 Bipolar II disorder: Secondary | ICD-10-CM

## 2015-12-23 ENCOUNTER — Telehealth: Payer: Self-pay | Admitting: *Deleted

## 2015-12-23 MED ORDER — ESTRADIOL 0.1 MG/24HR TD PTTW: 1.0000 | MEDICATED_PATCH | TRANSDERMAL | Status: DC

## 2015-12-23 NOTE — Telephone Encounter (Signed)
Pt informed Rx sent. 

## 2015-12-23 NOTE — Telephone Encounter (Signed)
Pt called to follow up from [facility] 11/13/15 regarding "Recommend that we decrease her dose to 0.5 mg patch and see how she does with this" pt said she is not doing well on minivelle lower dose, pt would like to start back on minivelle 0.1 mg dose. Please advise

## 2015-12-23 NOTE — Telephone Encounter (Signed)
Okay to increase dose back up to 0.1 mg

## 2015-12-31 ENCOUNTER — Ambulatory Visit: Payer: No Typology Code available for payment source | Admitting: Licensed Clinical Social Worker

## 2016-01-09 ENCOUNTER — Encounter (HOSPITAL_COMMUNITY): Payer: Self-pay | Admitting: Emergency Medicine

## 2016-01-09 ENCOUNTER — Emergency Department (INDEPENDENT_AMBULATORY_CARE_PROVIDER_SITE_OTHER): Payer: No Typology Code available for payment source

## 2016-01-09 ENCOUNTER — Emergency Department (INDEPENDENT_AMBULATORY_CARE_PROVIDER_SITE_OTHER)
Admission: EM | Admit: 2016-01-09 | Discharge: 2016-01-09 | Disposition: A | Discharge status: Home / Self Care | Payer: No Typology Code available for payment source | Source: Home / Self Care | Attending: Emergency Medicine | Admitting: Emergency Medicine

## 2016-01-09 DIAGNOSIS — M19042 Primary osteoarthritis, left hand: Secondary | ICD-10-CM | POA: Diagnosis not present

## 2016-01-09 DIAGNOSIS — M1812 Unilateral primary osteoarthritis of first carpometacarpal joint, left hand: Secondary | ICD-10-CM

## 2016-01-09 MED ORDER — TRAMADOL HCL 50 MG PO TABS
50.0000 mg | ORAL_TABLET | Freq: Every day | ORAL | Status: DC | PRN
Start: 2016-01-09 — End: 2016-02-25

## 2016-01-09 NOTE — ED Provider Notes (Signed)
CSN: [id number]     Arrival date & time 01/09/16  51 History   First MD Initiated Contact with Patient 01/09/16 1800     Chief Complaint  Patient presents with  . Extremity Pain   (Consider location/radiation/quality/duration/timing/severity/associated sxs/prior Treatment) HPI  She is a 55 year old woman here for evaluation of left hand pain.  She states this started about 2 months ago and has gradually been getting worse. The pain is located at the base of the left thumb. It is worse with grip and thumb opposition.  It will sometimes radiate up into her forearm. She denies any known injury or trauma. She does work as a Secretary/administrator. She has not noticed any swelling. She has tried Aleve and ibuprofen without improvement. She has also done ice and heat without improvement.  Past Medical History  Diagnosis Date  . Bipolar disorder ([place])   . Migraine   . Helicobacter pylori (H. pylori) 07/2010  . Anxiety    Past Surgical History  Procedure Laterality Date  . Cervical fusion    . Tubal ligation    . Upper gastrointestinal endoscopy    . Vaginal hysterectomy  2001    USO by history-sono2011 question of both ovaries present   Family History  Problem Relation Age of Onset  . Hypertension Mother   . Hypertension Brother    Social History  Substance Use Topics  . Smoking status: Former Research scientist (life sciences)  . Smokeless tobacco: Never Used  . Alcohol Use: No   OB History    Gravida Para Term Preterm AB TAB SAB Ectopic Multiple Living   4 3   1  1   3      Review of Systems As in history of present illness Allergies  Review of patient's allergies indicates no known allergies.  Home Medications   Prior to Admission medications   Medication Sig Start Date End Date Taking? Authorizing Provider  busPIRone (BUSPAR) 30 MG tablet TAKE 1 TABLET (30 MG TOTAL) BY MOUTH 2 (TWO) TIMES DAILY. 11/11/15  Yes Elby Showers, MD  estradiol (MINIVELLE) 0.1 MG/24HR patch Place 1 patch (0.1 mg total) onto the  skin 2 (two) times a week. 12/23/15  Yes Anastasio Auerbach, MD  lamoTRIgine (LAMICTAL) 100 MG tablet TAKE 1 TABLET IN THE MORNING AND 1 AND 1/2 TABLETS AT NIGHT 08/01/15  Yes Elby Showers, MD  Multiple Vitamin (MULTIVITAMIN) capsule Take 1 capsule by mouth daily.     Yes Historical Provider, MD  topiramate (TOPAMAX) 200 MG tablet Take 400 mg by mouth daily.    Yes Historical Provider, MD  ibuprofen (ADVIL,MOTRIN) 600 MG tablet Take 1 tablet (600 mg total) by mouth every 8 (eight) hours as needed for pain. 01/19/13   Adlih Moreno-Coll, MD  traMADol (ULTRAM) 50 MG tablet Take 1 tablet (50 mg total) by mouth daily as needed for severe pain. 01/09/16   Melony Overly, MD   Meds Ordered and Administered this Visit  Medications - No data to display  BP 113/83 mmHg  Pulse 63  Temp(Src) 98.1 F (36.7 C) (Oral)  Resp 18  SpO2 100%  LMP 11/02/2000 No data found.   Physical Exam  Constitutional: She is oriented to person, place, and time. She appears well-developed and well-nourished. No distress.  Cardiovascular: Normal rate.   Pulmonary/Chest: Effort normal.  Musculoskeletal:  Left hand: No erythema or edema. She has full active range of motion of her wrist and thumb. She does have some tenderness at the base of  the thumb, worse on the palmar aspect. No appreciable crepitus. No wrist pain or tenderness. No tenderness over the extensor tendons of the thumb. Brisk cap refill.  Neurological: She is alert and oriented to person, place, and time.    ED Course  Procedures (including critical care time)  Labs Review Labs Reviewed - No data to display  Imaging Review Dg Hand Complete Left  01/09/2016  CLINICAL DATA:  Pain base of thumb EXAM: LEFT HAND - COMPLETE 3+ VIEW COMPARISON:  None. FINDINGS: Normal alignment no fracture. Small cyst in the triquetrum suggesting early degenerative change at the base of thumb. IMPRESSION: Early degenerative change base of thumb.  Negative for fracture  Electronically Signed   By: Franchot Gallo M.D.   On: 01/09/2016 18:32     MDM   1. Degenerative arthritis of thumb, left    X-ray consistent with arthritis of the thumb. Symptomatic treatment with some spica brace, Tylenol. Prescription given for tramadol to use as needed for severe pain. Follow-up as needed.    Melony Overly, MD 01/09/16 [phone]

## 2016-01-09 NOTE — ED Notes (Signed)
The patient presented to the [facility] with a complaint of left arm and wrist pain x 2 months. 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

## 2016-01-09 NOTE — Discharge Instructions (Signed)
You have arthritis in your thumb. Wear the brace as much as you can. I think this will help a lot with your pain. Take Tylenol arthritis as needed for pain. Use the tramadol as needed for severe pain. Do not drive while taking this medicine. Follow-up as needed.

## 2016-01-22 ENCOUNTER — Ambulatory Visit (HOSPITAL_COMMUNITY): Payer: Self-pay | Admitting: Psychiatry

## 2016-01-27 ENCOUNTER — Ambulatory Visit (INDEPENDENT_AMBULATORY_CARE_PROVIDER_SITE_OTHER): Payer: No Typology Code available for payment source | Admitting: Licensed Clinical Social Worker

## 2016-01-27 DIAGNOSIS — F3181 Bipolar II disorder: Secondary | ICD-10-CM | POA: Diagnosis not present

## 2016-02-05 ENCOUNTER — Other Ambulatory Visit: Payer: Self-pay

## 2016-02-05 DIAGNOSIS — Z1231 Encounter for screening mammogram for malignant neoplasm of breast: Secondary | ICD-10-CM

## 2016-02-10 ENCOUNTER — Ambulatory Visit (INDEPENDENT_AMBULATORY_CARE_PROVIDER_SITE_OTHER): Payer: No Typology Code available for payment source | Admitting: Licensed Clinical Social Worker

## 2016-02-10 DIAGNOSIS — F3181 Bipolar II disorder: Secondary | ICD-10-CM | POA: Diagnosis not present

## 2016-02-13 ENCOUNTER — Other Ambulatory Visit: Payer: Self-pay | Admitting: Gynecology

## 2016-02-21 ENCOUNTER — Other Ambulatory Visit: Payer: Self-pay | Admitting: Internal Medicine

## 2016-02-25 ENCOUNTER — Ambulatory Visit (INDEPENDENT_AMBULATORY_CARE_PROVIDER_SITE_OTHER): Payer: No Typology Code available for payment source | Admitting: Internal Medicine

## 2016-02-25 ENCOUNTER — Encounter: Payer: Self-pay | Admitting: Internal Medicine

## 2016-02-25 VITALS — BP 112/80 | HR 55 | Temp 97.3°F | Resp 20 | Wt 125.0 lb

## 2016-02-25 DIAGNOSIS — H6503 Acute serous otitis media, bilateral: Secondary | ICD-10-CM | POA: Diagnosis not present

## 2016-02-25 DIAGNOSIS — J309 Allergic rhinitis, unspecified: Secondary | ICD-10-CM

## 2016-02-25 NOTE — Patient Instructions (Signed)
Please call allergist for appointment. Let us know if  need short course of steroids prior to appointment.

## 2016-02-25 NOTE — Progress Notes (Signed)
   Subjective:    Patient ID: [name] [name], female    DOB: [birth year], 56 y.o.   MRN: [id number]  HPI Patient is self-employed in her own house cleaning business. Says about 6 months ago began to develop itchiness of her face, itchy eyes that sometimes are watery. She went to drugstore. She tried Claritin and subsequently Zyrtec. She's also tried Flonase nasal spray. Says symptoms are not getting any better. She does have a cat in the house. However feels the symptoms sometimes when she is another homes cleaning which do not have animals. Has noticed some slight nosebleed out of right nostril. Doesn't have any itchy palate or itchy ears. Patient does not smoke. No maxillary sinus pressure or discolored nasal drainage. No cough.    Review of Systems see above     Objective:   Physical Exam  TMs are full bilaterally but not red. Pharynx is clear. Boggy nasal mucosa. No active nosebleed. Neck is supple without adenopathy. Chest clear to auscultation.      Assessment & Plan:  Allergic rhinitis  Plan: Suggest patient be allergy tested by allergist. Was given number to call for appointment. Consider giving her a short course of prednisone but she is going to call allergist and see when she can be worked in. Explained to her she would not be able take antihistamines for at least 2 days before testing. Also prednisone would interfere with testing. She'll let me know how long his, take again appointment and whether or not we need to give her short course of steroids.

## 2016-03-05 ENCOUNTER — Ambulatory Visit (HOSPITAL_COMMUNITY): Payer: Self-pay | Admitting: Psychiatry

## 2016-03-05 ENCOUNTER — Ambulatory Visit: Payer: Self-pay

## 2016-03-09 ENCOUNTER — Ambulatory Visit (INDEPENDENT_AMBULATORY_CARE_PROVIDER_SITE_OTHER): Payer: No Typology Code available for payment source | Admitting: Licensed Clinical Social Worker

## 2016-03-09 DIAGNOSIS — F3181 Bipolar II disorder: Secondary | ICD-10-CM

## 2016-03-24 ENCOUNTER — Ambulatory Visit (INDEPENDENT_AMBULATORY_CARE_PROVIDER_SITE_OTHER): Payer: No Typology Code available for payment source | Admitting: Psychiatry

## 2016-03-24 ENCOUNTER — Encounter (HOSPITAL_COMMUNITY): Payer: Self-pay | Admitting: Psychiatry

## 2016-03-24 VITALS — BP 108/70 | HR 63 | Ht 61.0 in | Wt 127.0 lb

## 2016-03-24 DIAGNOSIS — F4310 Post-traumatic stress disorder, unspecified: Secondary | ICD-10-CM | POA: Diagnosis not present

## 2016-03-24 DIAGNOSIS — F332 Major depressive disorder, recurrent severe without psychotic features: Secondary | ICD-10-CM | POA: Diagnosis not present

## 2016-03-24 DIAGNOSIS — F411 Generalized anxiety disorder: Secondary | ICD-10-CM

## 2016-03-24 MED ORDER — LAMOTRIGINE 100 MG PO TABS: ORAL_TABLET | ORAL | Status: DC

## 2016-03-24 MED ORDER — PAROXETINE HCL 20 MG PO TABS: 20.0000 mg | ORAL_TABLET | Freq: Every day | ORAL | Status: DC
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
## 2016-04-06 ENCOUNTER — Ambulatory Visit: Payer: Self-pay | Admitting: Licensed Clinical Social Worker

## 2016-05-16 ENCOUNTER — Other Ambulatory Visit (HOSPITAL_COMMUNITY): Payer: Self-pay | Admitting: Psychiatry

## 2016-05-19 ENCOUNTER — Ambulatory Visit: Payer: No Typology Code available for payment source | Admitting: Internal Medicine

## 2016-05-28 ENCOUNTER — Encounter (HOSPITAL_COMMUNITY): Payer: Self-pay | Admitting: Psychiatry

## 2016-05-28 ENCOUNTER — Ambulatory Visit (HOSPITAL_COMMUNITY): Payer: No Typology Code available for payment source | Admitting: Psychiatry

## 2016-05-28 VITALS — BP 102/68 | HR 57 | Ht 61.0 in | Wt 125.0 lb

## 2016-05-28 DIAGNOSIS — F4310 Post-traumatic stress disorder, unspecified: Secondary | ICD-10-CM | POA: Diagnosis not present

## 2016-05-28 DIAGNOSIS — G4700 Insomnia, unspecified: Secondary | ICD-10-CM | POA: Diagnosis not present

## 2016-05-28 DIAGNOSIS — F411 Generalized anxiety disorder: Secondary | ICD-10-CM | POA: Diagnosis not present

## 2016-05-28 DIAGNOSIS — F332 Major depressive disorder, recurrent severe without psychotic features: Secondary | ICD-10-CM | POA: Diagnosis not present

## 2016-05-28 MED ORDER — VENLAFAXINE HCL ER 75 MG PO CP24: 75.0000 mg | ORAL_CAPSULE | Freq: Every day | ORAL | 3 refills | Status: DC

## 2016-05-28 MED ORDER — BUSPIRONE HCL 30 MG PO TABS: ORAL_TABLET | ORAL | 3 refills | Status: DC
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
## 2016-06-16 ENCOUNTER — Ambulatory Visit (INDEPENDENT_AMBULATORY_CARE_PROVIDER_SITE_OTHER): Payer: No Typology Code available for payment source | Admitting: Licensed Clinical Social Worker

## 2016-06-16 DIAGNOSIS — F3181 Bipolar II disorder: Secondary | ICD-10-CM | POA: Diagnosis not present

## 2016-08-03 ENCOUNTER — Telehealth: Payer: Self-pay | Admitting: Internal Medicine

## 2016-08-03 NOTE — Telephone Encounter (Signed)
Spoke with patient and advised we are happy to see her in the morning for this issue.  Patient will come in the morning at 10:15.  Patient confirmed.

## 2016-08-03 NOTE — Telephone Encounter (Signed)
See tomorrow

## 2016-08-03 NOTE — Telephone Encounter (Signed)
Patient calling stating that her hands and arms are hurting with arthritis.  Requesting to be seen this afternoon.  Advised patient that we are booked this afternoon; advised I would be happy to provide her appointment for tomorrow.  Patient advised she has been having this problem for months.  Advised you were treating her with pain medication for this issue and she cannot take this medication and work; it knocks her out.  She wants to know what else she can take or do for this??  Advised that we are booked this afternoon and she has called at 2:15 p.m. With only a couple of hours left in the afternoon.  Patient advised she is off this afternoon.    Advised I will ask to see if there is something else that is recommended that she try to provide relief for her arthritis.  She continues to work, but doesn't want to take the pain medication.  Asked if she couldn't take this medication at night after she gets home?  She said she needs something to take during the day to provide relief during the day.    Please advise.

## 2016-08-04 ENCOUNTER — Ambulatory Visit (INDEPENDENT_AMBULATORY_CARE_PROVIDER_SITE_OTHER): Payer: No Typology Code available for payment source | Admitting: Internal Medicine

## 2016-08-04 ENCOUNTER — Encounter: Payer: Self-pay | Admitting: Internal Medicine

## 2016-08-04 VITALS — BP 122/84 | HR 66 | Temp 97.9°F | Wt 129.5 lb

## 2016-08-04 DIAGNOSIS — M79641 Pain in right hand: Secondary | ICD-10-CM

## 2016-08-04 DIAGNOSIS — Z8669 Personal history of other diseases of the nervous system and sense organs: Secondary | ICD-10-CM

## 2016-08-04 DIAGNOSIS — F3176 Bipolar disorder, in full remission, most recent episode depressed: Secondary | ICD-10-CM | POA: Diagnosis not present

## 2016-08-04 DIAGNOSIS — M1990 Unspecified osteoarthritis, unspecified site: Secondary | ICD-10-CM | POA: Diagnosis not present

## 2016-08-04 DIAGNOSIS — M19042 Primary osteoarthritis, left hand: Secondary | ICD-10-CM

## 2016-08-04 DIAGNOSIS — M79642 Pain in left hand: Secondary | ICD-10-CM | POA: Diagnosis not present

## 2016-08-04 DIAGNOSIS — M19041 Primary osteoarthritis, right hand: Secondary | ICD-10-CM

## 2016-08-04 MED ORDER — MELOXICAM 15 MG PO TABS: 15.0000 mg | ORAL_TABLET | Freq: Every day | ORAL | 0 refills | Status: DC
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
## 2016-08-04 NOTE — Patient Instructions (Signed)
Rheumatology consultation to be arranged. Change from Aleve to meloxicam 15 mg daily. Rheumatology studies pending.

## 2016-08-05 LAB — CBC WITH DIFFERENTIAL/PLATELET
BASOS PCT: 0 %
Basophils Absolute: 0 cells/uL (ref 0–200)
EOS PCT: 2 %
Eosinophils Absolute: 96 cells/uL (ref 15–500)
HEMATOCRIT: 40.4 % (ref 35.0–45.0)
HEMOGLOBIN: 13.6 g/dL (ref 11.7–15.5)
LYMPHS ABS: 1536 {cells}/uL (ref 850–3900)
Lymphocytes Relative: 32 %
MCH: 31.1 pg (ref 27.0–33.0)
MCHC: 33.7 g/dL (ref 32.0–36.0)
MCV: 92.2 fL (ref 80.0–100.0)
MONO ABS: 528 {cells}/uL (ref 200–950)
MPV: 10.8 fL (ref 7.5–12.5)
Monocytes Relative: 11 %
NEUTROS ABS: 2640 {cells}/uL (ref 1500–7800)
NEUTROS PCT: 55 %
Platelets: 197 10*3/uL (ref 140–400)
RBC: 4.38 MIL/uL (ref 3.80–5.10)
RDW: 13.9 % (ref 11.0–15.0)
WBC: 4.8 10*3/uL (ref 3.8–10.8)

## 2016-08-05 LAB — RHEUMATOID FACTOR

## 2016-08-05 LAB — SEDIMENTATION RATE: SED RATE: 4 mm/h (ref 0–30)

## 2016-08-05 LAB — ANA: Anti Nuclear Antibody(ANA): POSITIVE — AB

## 2016-08-05 LAB — ANTI-NUCLEAR AB-TITER (ANA TITER): ANA Titer 1: 1:40 {titer} — ABNORMAL HIGH

## 2016-08-05 LAB — CYCLIC CITRUL PEPTIDE ANTIBODY, IGG

## 2016-08-11 ENCOUNTER — Telehealth: Payer: Self-pay | Admitting: *Deleted

## 2016-08-11 NOTE — Telephone Encounter (Signed)
Specific test for Rheumatoid arthritis is negative but she still could have it. Needs to keep appt with Rheumatologist. They have contacted her about appointment.

## 2016-08-11 NOTE — Telephone Encounter (Signed)
Pt informed

## 2016-08-11 NOTE — Telephone Encounter (Signed)
Pt requesting results from blood work.

## 2016-08-24 ENCOUNTER — Encounter (HOSPITAL_COMMUNITY): Payer: Self-pay | Admitting: Emergency Medicine

## 2016-08-24 ENCOUNTER — Ambulatory Visit (HOSPITAL_COMMUNITY)
Admission: EM | Admit: 2016-08-24 | Discharge: 2016-08-24 | Disposition: A | Discharge status: Home / Self Care | Payer: No Typology Code available for payment source | Attending: Family Medicine | Admitting: Family Medicine

## 2016-08-24 DIAGNOSIS — R079 Chest pain, unspecified: Secondary | ICD-10-CM

## 2016-08-24 DIAGNOSIS — R0789 Other chest pain: Secondary | ICD-10-CM | POA: Diagnosis not present

## 2016-08-24 DIAGNOSIS — R5383 Other fatigue: Secondary | ICD-10-CM | POA: Diagnosis not present

## 2016-08-24 DIAGNOSIS — R42 Dizziness and giddiness: Secondary | ICD-10-CM | POA: Diagnosis not present

## 2016-08-24 MED ORDER — MECLIZINE HCL 12.5 MG PO TABS: 12.5000 mg | ORAL_TABLET | Freq: Three times a day (TID) | ORAL | 0 refills | Status: DC | PRN

## 2016-08-24 NOTE — ED Provider Notes (Signed)
CSN: [id number]     Arrival date & time 08/24/16  1746 History   First MD Initiated Contact with Patient 08/24/16 1846     Chief Complaint  Patient presents with  . Chest Pain   (Consider location/radiation/quality/duration/timing/severity/associated sxs/prior Treatment) HPI  55 y.o. female presents to UC c/o exhaustion, dizziness, and occasional chest pain x 1.5 months.  Worsening since Friday.  Explains that since 3 days ago has had absolutely no energy.  CP became more constant yesterday with any light exertion but also with breathing. Chest is tender to touch and hurts with breathing. Describes it as a tightness. No associated palpitations, numbness, tingling. Denies fever, SOB. Denies any heavy lifting or recent exertional activity. Reports hair thinning and falling out, skin and nail thinning, has TSH drawn at [facility] regularly with no indication for hypothyroidism.  Dizziness is not associated with room spinning. Has had vertigo in past, and is not similar. Has history of PTSD. Effexor started 2.5 months ago. She has not been more depressed recently and does not think symptoms are related to mental health.  Has hx of frequent headaches but states that they have been more frequent. Hx of alternating constipation and diarrhea. Family history is limited, was in foster care. Knows that brother has no history of cardiac illness.    Past Medical History:  Diagnosis Date  . Anxiety   . Bipolar disorder ([place])   . Helicobacter pylori (H. pylori) 07/2010  . Migraine    Past Surgical History:  Procedure Laterality Date  . CERVICAL FUSION    . TUBAL LIGATION    . UPPER GASTROINTESTINAL ENDOSCOPY    . VAGINAL HYSTERECTOMY  2001   USO by history-sono2011 question of both ovaries present   Family History  Problem Relation Age of Onset  . Adopted: Yes  . Hypertension Mother   . Mental illness Mother   . Hypertension Brother    Social History  Substance Use Topics  . Smoking status: Former  Smoker    Types: Cigarettes  . Smokeless tobacco: Never Used  . Alcohol use No   OB History    Gravida Para Term Preterm AB Living   4 3     1 3    SAB TAB Ectopic Multiple Live Births   1             Review of Systems   Denies: NAUSEA, ABDOMINAL PAIN, CHEST PAIN, CONGESTION, DYSURIA, SHORTNESS OF BREATH  Allergies  Review of patient's allergies indicates no known allergies.  Home Medications   Prior to Admission medications   Medication Sig Start Date End Date Taking? Authorizing Provider  busPIRone (BUSPAR) 30 MG tablet TAKE 1 TABLET (30 MG TOTAL) BY MOUTH 2 (TWO) TIMES DAILY. 05/28/16  Yes Charlcie Cradle, MD  meloxicam (MOBIC) 15 MG tablet Take 1 tablet (15 mg total) by mouth daily. 08/04/16  Yes Elby Showers, MD  MINIVELLE 0.1 MG/24HR patch APPLY 1 PATCH TO SKIN TWICE WEEKLY *ASK PT FOR COUPON* 02/13/16  Yes Anastasio Auerbach, MD  Multiple Vitamin (MULTIVITAMIN) capsule Take 1 capsule by mouth daily.     Yes Historical Provider, MD  topiramate (TOPAMAX) 200 MG tablet Take 400 mg by mouth daily.    Yes Historical Provider, MD  venlafaxine XR (EFFEXOR XR) 75 MG 24 hr capsule Take 1 capsule (75 mg total) by mouth daily. 05/28/16 05/28/17 Yes Charlcie Cradle, MD  meclizine (ANTIVERT) 12.5 MG tablet Take 1 tablet (12.5 mg total) by mouth 3 (three) times  daily as needed for dizziness. 08/24/16   Konrad Felix, PA   Meds Ordered and Administered this Visit  Medications - No data to display  BP 128/73 (BP Location: Left Arm)   Pulse 62   Temp 98.6 F (37 C) (Oral)   Resp 12   LMP 11/02/2000   SpO2 100%  No data found.   Physical Exam  Constitutional: She is oriented to person, place, and time. She appears well-developed and well-nourished. No distress.  HENT:  Head: Normocephalic.  Mouth/Throat: Oropharynx is clear and moist.  Eyes: Conjunctivae and EOM are normal. Pupils are equal, round, and reactive to light. Right eye exhibits normal extraocular motion and no nystagmus.  Left eye exhibits normal extraocular motion and no nystagmus.  Neck: Normal range of motion. Neck supple. No tracheal deviation present. No thyromegaly present.  Cardiovascular: Normal rate, regular rhythm and normal heart sounds.   Pulmonary/Chest: Effort normal and breath sounds normal. No respiratory distress. She has no wheezes. She exhibits tenderness (Chest tenderness with palpation over left side).  Abdominal: Soft. She exhibits no distension. There is no tenderness.  Musculoskeletal: Normal range of motion.  Neurological: She is alert and oriented to person, place, and time.  Skin: Skin is warm and dry. Capillary refill takes less than 2 seconds. No rash noted. She is not diaphoretic. No erythema.  Psychiatric: She has a normal mood and affect. Her behavior is normal.    Urgent Care Course   Clinical Course    Procedures (including critical care time)  Labs Review Labs Reviewed - No data to display  Imaging Review No results found.   MDM   1. Dizziness   2. Fatigue, unspecified type   3. Chest pain not due to acute coronary syndrome    55 y.o female presents with recent worsening of fatigue, dizziness, and occasional chest pain over the past 3 days.  EKG performed: No acute abnormalities; nonspecific T wave changes and incomplete RBBB.   No pallor to mucus membranes. No nystagmus present.  Meclizine 12.5 mg tid, 30 tablets prescribed at today's visit.   Encourage following up with PCP for further workup.    Konrad Felix, [place] 08/24/16 2028

## 2016-08-24 NOTE — ED Triage Notes (Signed)
Patient presents to [facility] today, She has a complaint of Chest Pain, she states that it began to worsen on Saturday. She states that the least little thing wears her out. She states that she has had many bouts of dizziness.

## 2016-08-25 ENCOUNTER — Ambulatory Visit (INDEPENDENT_AMBULATORY_CARE_PROVIDER_SITE_OTHER): Payer: No Typology Code available for payment source | Admitting: Psychiatry

## 2016-08-25 ENCOUNTER — Encounter (HOSPITAL_COMMUNITY): Payer: Self-pay | Admitting: Psychiatry

## 2016-08-25 DIAGNOSIS — F4310 Post-traumatic stress disorder, unspecified: Secondary | ICD-10-CM | POA: Diagnosis not present

## 2016-08-25 DIAGNOSIS — Z87891 Personal history of nicotine dependence: Secondary | ICD-10-CM

## 2016-08-25 DIAGNOSIS — F332 Major depressive disorder, recurrent severe without psychotic features: Secondary | ICD-10-CM

## 2016-08-25 DIAGNOSIS — F411 Generalized anxiety disorder: Secondary | ICD-10-CM

## 2016-08-25 DIAGNOSIS — Z79899 Other long term (current) drug therapy: Secondary | ICD-10-CM

## 2016-08-25 MED ORDER — VENLAFAXINE HCL ER 75 MG PO CP24: 75.0000 mg | ORAL_CAPSULE | Freq: Every day | ORAL | 3 refills | Status: DC
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
## 2016-08-30 ENCOUNTER — Other Ambulatory Visit: Payer: Self-pay | Admitting: Internal Medicine

## 2016-10-10 ENCOUNTER — Other Ambulatory Visit (HOSPITAL_COMMUNITY): Payer: Self-pay | Admitting: Psychiatry

## 2016-10-10 DIAGNOSIS — F332 Major depressive disorder, recurrent severe without psychotic features: Secondary | ICD-10-CM

## 2016-10-10 DIAGNOSIS — F411 Generalized anxiety disorder: Secondary | ICD-10-CM

## 2016-10-16 ENCOUNTER — Telehealth: Payer: Self-pay | Admitting: Internal Medicine

## 2016-10-16 NOTE — Telephone Encounter (Signed)
Cannot increase Meloxicam . Dose is 15 mg daily OK to refill til sees Rheumatologist

## 2016-10-16 NOTE — Telephone Encounter (Signed)
Advised patient that meloxicam cannot be increased.  Offered a refill but patient refused at this time.

## 2016-10-16 NOTE — Telephone Encounter (Signed)
Patient states that she's having terrible pain in her left hand.  Wants to know if you will refill her Meloxicam.  Also wants to know if you will increase the strength of the medication as this doesn't seem to be helping for a significant length of time at this point.  Advised patient that you referred her to Rheumatologist.  Asked her what they did for her.  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  Advised that we worked to get her this appointment with [facility] and she probably should make sure that she keeps these appointments in the future.  And, if she is seeking to increase the strength of the medication, you are most likely going to need to see her, but I would put a message in to you and inquire.    Pharmacy:  [facility] on [address]  Best number for contact:  [phone]

## 2016-10-28 ENCOUNTER — Encounter (HOSPITAL_COMMUNITY): Payer: Self-pay | Admitting: Emergency Medicine

## 2016-10-28 ENCOUNTER — Ambulatory Visit (INDEPENDENT_AMBULATORY_CARE_PROVIDER_SITE_OTHER): Payer: No Typology Code available for payment source

## 2016-10-28 ENCOUNTER — Ambulatory Visit (HOSPITAL_COMMUNITY)
Admission: EM | Admit: 2016-10-28 | Discharge: 2016-10-28 | Disposition: A | Discharge status: Home / Self Care | Payer: No Typology Code available for payment source | Attending: Family Medicine | Admitting: Family Medicine

## 2016-10-28 DIAGNOSIS — M7551 Bursitis of right shoulder: Secondary | ICD-10-CM | POA: Diagnosis not present

## 2016-10-28 MED ORDER — KETOROLAC TROMETHAMINE 60 MG/2ML IM SOLN
INTRAMUSCULAR | Status: AC
  Filled 2016-10-28: qty 2

## 2016-10-28 MED ORDER — KETOROLAC TROMETHAMINE 60 MG/2ML IM SOLN
60.0000 mg | Freq: Once | INTRAMUSCULAR | Status: AC
  Administered 2016-10-28: 60 mg via INTRAMUSCULAR

## 2016-10-28 MED ORDER — PREDNISONE 10 MG (21) PO TBPK: 10.0000 mg | ORAL_TABLET | Freq: Every day | ORAL | 0 refills | Status: DC

## 2016-10-28 NOTE — ED Provider Notes (Signed)
CSN: [id number]     Arrival date & time 10/28/16  1736 History   First MD Initiated Contact with Patient 10/28/16 1847     Chief Complaint  Patient presents with  . Shoulder Pain  . Hand Pain   (Consider location/radiation/quality/duration/timing/severity/associated sxs/prior Treatment) 55 year old female patient presents to clinic with 1 week history of right should pain. Pain is worse with movement and with weight bearing. She reports she has tried OTC analgesics without relief. The pain is described as a throbbing pressure and occasionally is a shooting pain. The pain does not radiate.  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   The history is provided by the patient.  Shoulder Pain  Associated symptoms: no back pain and no neck pain   Hand Pain     Past Medical History:  Diagnosis Date  . Anxiety   . Arthritis   . Bipolar disorder ([place])   . Helicobacter pylori (H. pylori) 07/2010  . Migraine    Past Surgical History:  Procedure Laterality Date  . CERVICAL FUSION    . TUBAL LIGATION    . UPPER GASTROINTESTINAL ENDOSCOPY    . VAGINAL HYSTERECTOMY  2001   USO by history-sono2011 question of both ovaries present   Family History  Problem Relation Age of Onset  . Adopted: Yes  . Hypertension Mother   . Mental illness Mother   . Hypertension Brother    Social History  Substance Use Topics  . Smoking status: Former Smoker    Types: Cigarettes  . Smokeless tobacco: Never Used  . Alcohol use No   OB History    Gravida Para Term Preterm AB Living   4 3     1 3    SAB TAB Ectopic Multiple Live Births   1             Review of Systems  Constitutional: Negative.   HENT: Negative.   Respiratory: Negative.   Cardiovascular: Negative.   Gastrointestinal: Negative.   Musculoskeletal: Positive for arthralgias and myalgias. Negative for back pain, joint swelling, neck pain and neck stiffness.  Skin: Negative.   Neurological: Negative.     Allergies    Patient has no known allergies.  Home Medications   Prior to Admission medications   Medication Sig Start Date End Date Taking? Authorizing Provider  MINIVELLE 0.1 MG/24HR patch APPLY 1 PATCH TO SKIN TWICE WEEKLY *ASK PT FOR COUPON* 02/13/16  Yes Anastasio Auerbach, MD  topiramate (TOPAMAX) 200 MG tablet Take 400 mg by mouth daily.    Yes Historical Provider, MD  predniSONE (STERAPRED UNI-PAK 21 TAB) 10 MG (21) TBPK tablet Take 1 tablet (10 mg total) by mouth daily. Take 6 tabs by mouth daily  for 2 days, then 5 tabs for 2 days, then 4 tabs for 2 days, then 3 tabs for 2 days, 2 tabs for 2 days, then 1 tab by mouth daily for 2 days 10/28/16   Barnet Glasgow, NP   Meds Ordered and Administered this Visit   Medications  ketorolac (TORADOL) injection 60 mg (60 mg Intramuscular Given 10/28/16 1914)    BP 107/72 (BP Location: Left Arm)   Pulse 66   Temp 98.4 F (36.9 C) (Oral)   Resp 16   LMP 11/02/2000   SpO2 100%  No data found.   Physical Exam  Constitutional: She is oriented to person, place, and time. She appears well-developed and well-nourished. No distress.  HENT:  Head: Normocephalic.  Right Ear: External ear normal.  Left Ear: External ear normal.  Neck: Normal range of motion. Neck supple.  Cardiovascular: Normal rate and regular rhythm.   Pulmonary/Chest: Effort normal and breath sounds normal.  Abdominal: Soft. Bowel sounds are normal.  Musculoskeletal:       Right shoulder: She exhibits decreased range of motion, tenderness, crepitus and pain. She exhibits no bony tenderness, no swelling, no effusion, no deformity, no laceration, no spasm, normal pulse and normal strength.       Arms: Lymphadenopathy:    She has no cervical adenopathy.  Neurological: She is alert and oriented to person, place, and time.  Skin: Skin is warm and dry. Capillary refill takes less than 2 seconds. She is not diaphoretic. No pallor.    Urgent Care Course   Clinical Course      Procedures (including critical care time)  Labs Review Labs Reviewed - No data to display  Imaging Review Dg Shoulder Right  Result Date: 10/28/2016 CLINICAL DATA:  RIGHT shoulder pain, no injury. EXAM: RIGHT SHOULDER - 2+ VIEW COMPARISON:  None. FINDINGS: The humeral head is well-formed and located. The subacromial, glenohumeral and acromioclavicular joint spaces are intact. No destructive bony lesions. Soft tissue planes are non-suspicious. IMPRESSION: Negative. Electronically Signed   By: Elon Alas M.D.   On: 10/28/2016 19:14     Visual Acuity Review  Right Eye Distance:   Left Eye Distance:   Bilateral Distance:    Right Eye Near:   Left Eye Near:    Bilateral Near:         MDM   1. Bursitis of right shoulder    Bursitis related to overuse of the affected shoulder, patient given Toradol injection in clinic and discharged with a prescription for prednisone taper, patient advised she may take tylenol for pain relief. Should symptoms fail to improve follow up with primary care provider or return to clinic.      Barnet Glasgow, NP 10/28/16 [phone]

## 2016-10-28 NOTE — ED Triage Notes (Signed)
The patient presented to the [facility] with a complaint of right shoulder pain and left hand pain x 1 week. The patient denied any injury to her shoulder and stated that the hand pain is arthritis and is being treated by her PCP.

## 2016-10-28 NOTE — ED Notes (Signed)
Patient transported to X-ray.  Room empty

## 2016-10-28 NOTE — Discharge Instructions (Signed)
You have been prescribed a steroid for inflammation in your shoulder from overuse. Take your medicines with food to reduce nausea and keep your appointment with your rheumatologist. Should symptoms fail to improve follow up with your primary care provider or return to clinic.

## 2016-11-13 ENCOUNTER — Ambulatory Visit: Payer: Self-pay

## 2016-11-24 ENCOUNTER — Ambulatory Visit (HOSPITAL_COMMUNITY): Payer: Self-pay | Admitting: Psychiatry

## 2016-12-02 ENCOUNTER — Ambulatory Visit
Admission: RE | Admit: 2016-12-02 | Discharge: 2016-12-02 | Disposition: A | Discharge status: Home / Self Care | Payer: No Typology Code available for payment source | Source: Ambulatory Visit

## 2016-12-02 DIAGNOSIS — Z1231 Encounter for screening mammogram for malignant neoplasm of breast: Secondary | ICD-10-CM

## 2016-12-21 ENCOUNTER — Ambulatory Visit (INDEPENDENT_AMBULATORY_CARE_PROVIDER_SITE_OTHER): Payer: No Typology Code available for payment source | Admitting: Gynecology

## 2016-12-21 ENCOUNTER — Encounter: Payer: Self-pay | Admitting: Gynecology

## 2016-12-21 VITALS — BP 118/76 | Ht 62.0 in | Wt 135.0 lb

## 2016-12-21 DIAGNOSIS — Z01411 Encounter for gynecological examination (general) (routine) with abnormal findings: Secondary | ICD-10-CM

## 2016-12-21 DIAGNOSIS — Z79890 Hormone replacement therapy (postmenopausal): Secondary | ICD-10-CM

## 2016-12-21 DIAGNOSIS — N952 Postmenopausal atrophic vaginitis: Secondary | ICD-10-CM

## 2016-12-21 MED ORDER — ESTRADIOL 0.1 MG/24HR TD PTTW
MEDICATED_PATCH | TRANSDERMAL | 12 refills | Status: DC
Start: 2016-12-21 — End: 2018-01-17

## 2016-12-21 NOTE — Patient Instructions (Signed)

## 2016-12-21 NOTE — Addendum Note (Signed)
Addended by: Nelva Nay on: 12/21/2016 12:10 PM   Modules accepted: Orders

## 2016-12-21 NOTE — Progress Notes (Signed)
    Marlane Vaillant [birth year] [id number]        56 y.o.  G4P0013 for annual exam.    Past medical history,surgical history, problem list, medications, allergies, family history and social history were all reviewed and documented as reviewed in the EPIC chart.  ROS:  Performed with pertinent positives and negatives included in the history, assessment and plan.   Additional significant findings :  None   Exam: Caryn Bee assistant Vitals:   12/21/16 1029  BP: 118/76  Weight: 135 lb (61.2 kg)  Height: 5\' 2"  (1.575 m)   Body mass index is 24.69 kg/m.  General appearance:  Normal affect, orientation and appearance. Skin: Grossly normal HEENT: Without gross lesions.  No cervical or supraclavicular adenopathy. Thyroid normal.  Lungs:  Clear without wheezing, rales or rhonchi Cardiac: RR, without RMG Abdominal:  Soft, nontender, without masses, guarding, rebound, organomegaly or hernia Breasts:  Examined lying and sitting without masses, retractions, discharge or axillary adenopathy. Pelvic:  Ext, BUS, Vagina with atrophic changes  Adnexa without masses or tenderness    Anus and perineum normal   Rectovaginal normal sphincter tone without palpated masses or tenderness.    Assessment/Plan:  56 y.o. G41P0013 female for annual exam.   1. Postmenopausal/atrophic genital changes/HRT. Continues on minivelle 0.1 mg. Tried decreasing to 0.05 mg last year but had unacceptable hot flushes. I again reviewed the issue of HRT, risks versus benefits. 2017 NAMS guidelines also discussed. Benefits to include symptom relief and possible cardiovascular/bone health when started early versus risks to include stroke heart attack DVT and breast cancer all reviewed. Patient's comfortable continuing and I refilled her 1 year. 2. Pap smear 2015. Pap smear done today. Options to stop screening altogether based on no history of abnormal Pap smears and hysterectomy for benign indications per current  screening guidelines reviewed. Will readdress on an annual basis. 3. Colonoscopy 2011 with reported repeat interval 10 years. 4. Mammography 11/2016. Continue with annual mammography when due. SBE monthly reviewed. 5. DEXA never. Will plan further into the menopause as she is on HRT. 6. Health maintenance. No routine lab work done as patient reports this done elsewhere. Follow up 1 year, sooner as needed.   Anastasio Auerbach MD, 11:32 AM 12/21/2016

## 2016-12-22 LAB — PAP IG W/ RFLX HPV ASCU

## 2017-01-19 ENCOUNTER — Encounter: Payer: Self-pay | Admitting: Internal Medicine

## 2017-01-19 ENCOUNTER — Ambulatory Visit (INDEPENDENT_AMBULATORY_CARE_PROVIDER_SITE_OTHER): Payer: No Typology Code available for payment source | Admitting: Internal Medicine

## 2017-01-19 VITALS — BP 110/80 | HR 65 | Temp 97.7°F | Ht 62.0 in | Wt 131.0 lb

## 2017-01-19 DIAGNOSIS — M5442 Lumbago with sciatica, left side: Secondary | ICD-10-CM | POA: Diagnosis not present

## 2017-01-19 MED ORDER — HYDROCODONE-ACETAMINOPHEN 5-325 MG PO TABS: 1.0000 | ORAL_TABLET | Freq: Four times a day (QID) | ORAL | 0 refills | Status: DC | PRN

## 2017-01-19 MED ORDER — CYCLOBENZAPRINE HCL 10 MG PO TABS: 10.0000 mg | ORAL_TABLET | Freq: Three times a day (TID) | ORAL | 3 refills | Status: DC | PRN

## 2017-01-19 NOTE — Patient Instructions (Signed)
Take Flexeril and Norco as prescribed. Call if not better in 5 days or sooner for

## 2017-01-19 NOTE — Progress Notes (Signed)
   Subjective:    Patient ID: [name] [name], female    DOB: 1961/09/26, 56 y.o.   MRN: [id number]  HPI 56 year old Female with 3 week history of back pain.Worse after working in 3 story home 7 hours.Back pain radiating into left leg. Says back pain pain is low and bilateral.  Do not see where she's had an MRI of the LS-spine.  Has seen Rheumatologist for left wrist arthritis. Wears splint. Tried meloxicam but it did not help.  Hx mild scoliosis lumbar spine.  Hx migraine headaches treated with Topamax.  Stopped psychiatry  meds and says she is doing well.  Sees Dr. Phineas Real for GYN care.  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    Review of Systems see above no weakness in left leg.     Objective:   Physical Exam  Musculoskeletal:  Range of motion of trunk fair. Muscle strength normal both extremities. DTRs 2+ and symmetrical.  Straight leg raising left lower extremity slightly positive at 90. Negative on the right.        Assessment & Plan:  Low back pain  Left lumbar radiculopathy  Plan: She does not like to take steroids because of adverse side effects. Flexeril 10 mg 1 by mouth 3 times a day when necessary muscle spasm. Norco 5/325 one by mouth every 6 hours when necessary pain. #20 with no refill.

## 2017-01-28 ENCOUNTER — Telehealth: Payer: Self-pay | Admitting: Internal Medicine

## 2017-01-28 NOTE — Telephone Encounter (Signed)
Patient called to give an update on her back pain; states that the medication is help some; however, when she has a bowel movement it hurts very bad during and afterwards.  States she is not constipated, but the actual movement causes her to be in pain for a long time.  She wanted to share that with you.

## 2017-01-28 NOTE — Telephone Encounter (Signed)
She should try more fiber in her diet and a stool softener such as Colace

## 2017-02-01 NOTE — Telephone Encounter (Signed)
Spoke with patient and provided instructions for more fiber.  Patient wanted examples of fiber; gave her examples of lentils, fruits, brown rice, green leafy vegetables.  Patient states she puts metamucil in her protein shake every morning and she eats fruits.  States that she isn't constipated and her stool isn't hard, but she'll do as Dr. Renold Genta is instructing.

## 2017-03-08 ENCOUNTER — Ambulatory Visit: Payer: Self-pay | Admitting: Internal Medicine

## 2017-03-08 ENCOUNTER — Other Ambulatory Visit: Payer: Self-pay

## 2017-03-08 NOTE — Telephone Encounter (Signed)
Please explain to pt as I did last time- could only give 5 days of narcotic with possible one refill after 5 days with OV. We cannot refill 2 months after office visit. She may need to see pain management doctor if she is going toneed chronic narcotic medication. You could book her for OV this afterrnoon to discuss.

## 2017-03-08 NOTE — Telephone Encounter (Signed)
Patient called to request a refill. Last refill 01/19/2017. Last OV 01/19/17.

## 2017-03-08 NOTE — Telephone Encounter (Signed)
Pt was notified, pt scheduled an appt for this evening then called back to cancel.

## 2017-03-18 NOTE — Telephone Encounter (Signed)
Error

## 2017-12-20 ENCOUNTER — Ambulatory Visit: Payer: BLUE CROSS/BLUE SHIELD | Admitting: Licensed Clinical Social Worker

## 2018-01-10 ENCOUNTER — Ambulatory Visit: Payer: BLUE CROSS/BLUE SHIELD | Admitting: Licensed Clinical Social Worker

## 2018-01-13 ENCOUNTER — Encounter: Payer: Self-pay | Admitting: Internal Medicine

## 2018-01-16 ENCOUNTER — Other Ambulatory Visit: Payer: Self-pay | Admitting: Gynecology

## 2018-01-17 ENCOUNTER — Telehealth: Payer: Self-pay | Admitting: *Deleted

## 2018-01-17 MED ORDER — ESTRADIOL 0.1 MG/24HR TD PTTW: MEDICATED_PATCH | TRANSDERMAL | 0 refills | Status: DC

## 2018-01-17 NOTE — Telephone Encounter (Signed)
Pt has annual exam scheduled on 03/15/18, needs refill on minivelle patch 0.1 mg. Rx sent.

## 2018-01-31 ENCOUNTER — Other Ambulatory Visit: Payer: Self-pay | Admitting: Internal Medicine

## 2018-01-31 DIAGNOSIS — Z1231 Encounter for screening mammogram for malignant neoplasm of breast: Secondary | ICD-10-CM

## 2018-02-24 ENCOUNTER — Ambulatory Visit
Admission: RE | Admit: 2018-02-24 | Discharge: 2018-02-24 | Disposition: A | Discharge status: Home / Self Care | Payer: Self-pay | Source: Ambulatory Visit | Attending: Internal Medicine | Admitting: Internal Medicine

## 2018-02-24 DIAGNOSIS — Z1231 Encounter for screening mammogram for malignant neoplasm of breast: Secondary | ICD-10-CM

## 2018-02-28 ENCOUNTER — Other Ambulatory Visit: Payer: Self-pay | Admitting: Internal Medicine

## 2018-02-28 DIAGNOSIS — R928 Other abnormal and inconclusive findings on diagnostic imaging of breast: Secondary | ICD-10-CM

## 2018-03-03 ENCOUNTER — Ambulatory Visit: Payer: Self-pay

## 2018-03-03 ENCOUNTER — Ambulatory Visit
Admission: RE | Admit: 2018-03-03 | Discharge: 2018-03-03 | Disposition: A | Discharge status: Home / Self Care | Payer: BLUE CROSS/BLUE SHIELD | Source: Ambulatory Visit | Attending: Internal Medicine | Admitting: Internal Medicine

## 2018-03-03 DIAGNOSIS — R928 Other abnormal and inconclusive findings on diagnostic imaging of breast: Secondary | ICD-10-CM

## 2018-03-15 ENCOUNTER — Encounter: Payer: Self-pay | Admitting: Gynecology

## 2018-03-15 ENCOUNTER — Ambulatory Visit: Payer: BLUE CROSS/BLUE SHIELD | Admitting: Gynecology

## 2018-03-15 VITALS — BP 118/76 | Ht 61.0 in | Wt 135.0 lb

## 2018-03-15 DIAGNOSIS — Z01419 Encounter for gynecological examination (general) (routine) without abnormal findings: Secondary | ICD-10-CM | POA: Diagnosis not present

## 2018-03-15 DIAGNOSIS — Z79890 Hormone replacement therapy (postmenopausal): Secondary | ICD-10-CM | POA: Diagnosis not present

## 2018-03-15 DIAGNOSIS — N952 Postmenopausal atrophic vaginitis: Secondary | ICD-10-CM

## 2018-03-15 MED ORDER — ESTRADIOL 0.1 MG/24HR TD PTTW: MEDICATED_PATCH | TRANSDERMAL | 4 refills | Status: DC

## 2018-03-15 NOTE — Patient Instructions (Signed)
Follow-up in 1 year for annual exam, sooner as needed. 

## 2018-03-15 NOTE — Progress Notes (Signed)
    [name] [name] 03/18/1961 [id number]        57 y.o.  [id number] for annual gynecologic exam.  Doing well without gynecologic complaints  Past medical history,surgical history, problem list, medications, allergies, family history and social history were all reviewed and documented as reviewed in the EPIC chart.  ROS:  Performed with pertinent positives and negatives included in the history, assessment and plan.   Additional significant findings : None   Exam: Caryn Bee assistant Vitals:   03/15/18 0757  BP: 118/76  Weight: 135 lb (61.2 kg)  Height: 5\' 1"  (1.549 m)   Body mass index is 25.51 kg/m.  General appearance:  Normal affect, orientation and appearance. Skin: Grossly normal HEENT: Without gross lesions.  No cervical or supraclavicular adenopathy. Thyroid normal.  Lungs:  Clear without wheezing, rales or rhonchi Cardiac: RR, without RMG Abdominal:  Soft, nontender, without masses, guarding, rebound, organomegaly or hernia Breasts:  Examined lying and sitting without masses, retractions, discharge or axillary adenopathy. Pelvic:  Ext, BUS, Vagina: With atrophic changes  Adnexa: Without masses or tenderness    Anus and perineum: Normal   Rectovaginal: Normal sphincter tone without palpated masses or tenderness.    Assessment/Plan:  57 y.o. [id number] female for annual gynecologic exam.   1. Postmenopausal/HRT/atrophic genital changes.  Status post TVH in the past with questionable USO.  Continues on minivelle 0.1 mg patch.  We again discussed the issues of HRT, risks versus benefits.  Thrombosis such as stroke heart attack DVT in the breast cancer issue reviewed.  Patient this point wants to continue and refill x1 year provided. 2. Mammography 01/2018.  Continue with annual mammography when due.  Breast exam normal today. 3. Pap smear 2018.  No Pap smear done today.  No history of significant abnormal Pap smears.  Options to stop screening per current screening  guidelines based on hysterectomy history reviewed.  Will readdress on an annual basis. 4. Colonoscopy 2011 with reported repeat interval 10 years. 5. DEXA never.  Will plan at age 36. 73. Health maintenance.  No routine lab work done as patient does this elsewhere.  Follow-up 1 year, sooner as needed.   Anastasio Auerbach MD, 8:19 AM 03/15/2018

## 2018-07-06 ENCOUNTER — Other Ambulatory Visit: Payer: Self-pay | Admitting: Gynecology

## 2018-07-18 ENCOUNTER — Ambulatory Visit: Payer: BLUE CROSS/BLUE SHIELD | Admitting: Internal Medicine

## 2018-07-18 ENCOUNTER — Encounter: Payer: Self-pay | Admitting: Internal Medicine

## 2018-07-18 VITALS — BP 102/72 | HR 70 | Temp 98.6°F | Ht 61.0 in | Wt 125.0 lb

## 2018-07-18 DIAGNOSIS — L659 Nonscarring hair loss, unspecified: Secondary | ICD-10-CM | POA: Diagnosis not present

## 2018-07-18 DIAGNOSIS — L299 Pruritus, unspecified: Secondary | ICD-10-CM

## 2018-07-18 MED ORDER — METHYLPREDNISOLONE ACETATE 80 MG/ML IJ SUSP
80.0000 mg | Freq: Once | INTRAMUSCULAR | Status: AC
  Administered 2018-07-18: 80 mg via INTRAMUSCULAR

## 2018-07-18 MED ORDER — METHYLPREDNISOLONE ACETATE 80 MG/ML IJ SUSP: 80.0000 mg | Freq: Once | INTRAMUSCULAR | Status: DC

## 2018-07-18 NOTE — Patient Instructions (Signed)
Labs drawn and pending regarding hair loss. Depomedrol 80 mg IM. Continue Zyrtec. Add Zantac 150 mg bid as histamine blocker. If still itching in 10 days to 2 weeks, refer to allergist.

## 2018-07-18 NOTE — Progress Notes (Signed)
   Subjective:    Patient ID: [name] [name], female    DOB: [birth year], 57 y.o.   MRN: [id number]  HPI 57 year old Female not seen since March 2018 with history of bipolar disorder in today saying that she has had considerable issues recently with facial itching.  She attributed this to her allergic rhinitis.  Has been taking Zyrtec for some time.  Thought about switching to Claritin.  Says face is quite itchy.  Thought she might have a sinus infection.  Has tried Flonase nasal spray without much relief.  Complains of hair breaking off.  Says she is not eating well.  Says maybe eats one meal a day.  Her son graduated from [employer] and moved to [place] to take a job.  She is happy for him.  Says she is not lonely.  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  Denies being under excessive stress.  Today she has only eaten an apple and a protein bar all day long.  She does color her hair.    Review of Systems see above-says that legs feel prickly bilaterally.  Not really itchy.     Objective:   Physical Exam There is no obvious rash on her face or significant erythema.  Pharynx and TMs are clear.  Neck is supple.  Chest clear.       Assessment & Plan:  Hair loss-stress versus nutritional  Itching  Pruritus-do not see urticaria today  ?  Stress  History of bipolar disorder  Allergic rhinitis  Plan: Labs were drawn including CBC C met TSH.  Lipid panel was not checked today since she had eaten.  We are checking for  iron deficiency.  TSH checked.  She was given 80 mg IM Depo-Medrol for itching.  Advised to continue taking Zyrtec.  Take Zantac 150 mg twice daily to block histamine release.  If still itching in 10 days to 2 weeks, refer to allergist.  Patient apparently takes multivitamin with iron daily.

## 2018-07-19 ENCOUNTER — Ambulatory Visit: Payer: BLUE CROSS/BLUE SHIELD | Admitting: Internal Medicine

## 2018-07-19 LAB — CBC WITH DIFFERENTIAL/PLATELET
BASOS PCT: 0.3 %
Basophils Absolute: 17 cells/uL (ref 0–200)
EOS ABS: 70 {cells}/uL (ref 15–500)
Eosinophils Relative: 1.2 %
HCT: 38.8 % (ref 35.0–45.0)
Hemoglobin: 13.2 g/dL (ref 11.7–15.5)
LYMPHS ABS: 2349 {cells}/uL (ref 850–3900)
MCH: 30.9 pg (ref 27.0–33.0)
MCHC: 34 g/dL (ref 32.0–36.0)
MCV: 90.9 fL (ref 80.0–100.0)
MPV: 11.4 fL (ref 7.5–12.5)
Monocytes Relative: 8.1 %
NEUTROS PCT: 49.9 %
Neutro Abs: 2894 cells/uL (ref 1500–7800)
Platelets: 200 10*3/uL (ref 140–400)
RBC: 4.27 10*6/uL (ref 3.80–5.10)
RDW: 12.3 % (ref 11.0–15.0)
Total Lymphocyte: 40.5 %
WBC: 5.8 10*3/uL (ref 3.8–10.8)
WBCMIX: 470 {cells}/uL (ref 200–950)

## 2018-07-19 LAB — IRON,TIBC AND FERRITIN PANEL
%SAT: 20 % (ref 16–45)
FERRITIN: 81 ng/mL (ref 16–232)
Iron: 58 ug/dL (ref 45–160)
TIBC: 287 mcg/dL (calc) (ref 250–450)

## 2018-07-19 LAB — COMPLETE METABOLIC PANEL WITH GFR
AG Ratio: 1.5 (calc) (ref 1.0–2.5)
ALBUMIN MSPROF: 4.4 g/dL (ref 3.6–5.1)
ALT: 11 U/L (ref 6–29)
AST: 16 U/L (ref 10–35)
Alkaline phosphatase (APISO): 40 U/L (ref 33–130)
BUN: 14 mg/dL (ref 7–25)
CALCIUM: 9.7 mg/dL (ref 8.6–10.4)
CO2: 23 mmol/L (ref 20–32)
CREATININE: 0.77 mg/dL (ref 0.50–1.05)
Chloride: 112 mmol/L — ABNORMAL HIGH (ref 98–110)
GFR, Est African American: 100 mL/min/{1.73_m2} (ref >=60)
GFR, Est Non African American: 86 mL/min/{1.73_m2} (ref >=60)
GLUCOSE: 87 mg/dL (ref 65–99)
Globulin: 2.9 g/dL (calc) (ref 1.9–3.7)
Potassium: 4.9 mmol/L (ref 3.5–5.3)
Sodium: 142 mmol/L (ref 135–146)
TOTAL PROTEIN: 7.3 g/dL (ref 6.1–8.1)
Total Bilirubin: 0.4 mg/dL (ref 0.2–1.2)

## 2018-07-19 LAB — TSH: TSH: 1.14 m[IU]/L (ref 0.40–4.50)

## 2018-07-19 LAB — VITAMIN D 25 HYDROXY (VIT D DEFICIENCY, FRACTURES): Vit D, 25-Hydroxy: 42 ng/mL (ref 30–100)

## 2018-09-12 ENCOUNTER — Telehealth: Payer: Self-pay | Admitting: Emergency Medicine

## 2018-09-12 NOTE — Telephone Encounter (Signed)
We saw her in Sept and gave her a shot of Depomedrol. We will need to see her.

## 2018-09-12 NOTE — Telephone Encounter (Signed)
Pt called stating she was previously seen for facial itching. She is still having the itching and wants to know what she can do next or if she needs to make an appointment to be seen. Thanks.

## 2018-09-13 ENCOUNTER — Encounter: Payer: Self-pay | Admitting: Internal Medicine

## 2018-09-13 ENCOUNTER — Ambulatory Visit: Payer: BLUE CROSS/BLUE SHIELD | Admitting: Internal Medicine

## 2018-09-13 VITALS — BP 120/70 | HR 78 | Temp 98.1°F | Ht 61.0 in | Wt 128.0 lb

## 2018-09-13 DIAGNOSIS — L299 Pruritus, unspecified: Secondary | ICD-10-CM

## 2018-09-13 MED ORDER — HYDROXYZINE PAMOATE 100 MG PO CAPS: 100.0000 mg | ORAL_CAPSULE | Freq: Four times a day (QID) | ORAL | 0 refills | Status: DC | PRN

## 2018-09-25 NOTE — Progress Notes (Deleted)
   Subjective:    Patient ID: [name] [name], female    DOB: Jun 28, 1961, 57 y.o.   MRN: [id number]  HPI    Review of Systems     Objective:   Physical Exam        Assessment & Plan:

## 2018-09-25 NOTE — Patient Instructions (Signed)
Vistaril 100 mg 4 times a day as needed for itching.  Advise appointment with allergist.

## 2018-09-25 NOTE — Progress Notes (Signed)
Subjective:    Patient ID: [name] [name], female    DOB: 19-Jun-1961, 57 y.o.   MRN: [id number]  HPI Patient was seen in September complaining of considerable issues with facial itching which she attributed to allergic rhinitis.  She has not been taking Zyrtec for some time.  Flonase nasal spray did not help.  No facial rash was noted at that visit September 16.  She was given Depo-Medrol.  Was advised to take Zantac 150 mg twice daily to block histamine release..  Asked her to call if still itching in 2 weeks and we will refer her to allergist.  Labs were drawn at that time including vitamin D, TSH, CBC, C met iron and iron binding capacity due to complaint of fatigue and hair breaking off.  These labs were all normal.  She has a history of bipolar disorder and anxiety.     Review of Systems see above     Objective:   Physical Exam Once again no evidence of facial rash, erythema, scaling to explain symptoms       Assessment & Plan:  Facial itching?  Allergic rhinitis  Plan: Vistaril 100 mg 4 times a day as needed for itching #30 with no refill.  Recommend allergy testing.  Offered injection of Depo-Medrol but patient declined.

## 2018-10-13 ENCOUNTER — Encounter: Payer: Self-pay | Admitting: Internal Medicine

## 2018-10-13 ENCOUNTER — Ambulatory Visit (INDEPENDENT_AMBULATORY_CARE_PROVIDER_SITE_OTHER): Payer: BLUE CROSS/BLUE SHIELD | Admitting: Internal Medicine

## 2018-10-13 VITALS — BP 120/90 | HR 54 | Temp 98.1°F | Ht 61.0 in | Wt 127.0 lb

## 2018-10-13 DIAGNOSIS — E7800 Pure hypercholesterolemia, unspecified: Secondary | ICD-10-CM | POA: Diagnosis not present

## 2018-10-13 DIAGNOSIS — Z0000 Encounter for general adult medical examination without abnormal findings: Secondary | ICD-10-CM | POA: Diagnosis not present

## 2018-10-13 DIAGNOSIS — F411 Generalized anxiety disorder: Secondary | ICD-10-CM | POA: Diagnosis not present

## 2018-10-13 DIAGNOSIS — Z8669 Personal history of other diseases of the nervous system and sense organs: Secondary | ICD-10-CM

## 2018-10-13 DIAGNOSIS — Z8659 Personal history of other mental and behavioral disorders: Secondary | ICD-10-CM

## 2018-10-13 DIAGNOSIS — L299 Pruritus, unspecified: Secondary | ICD-10-CM | POA: Diagnosis not present

## 2018-10-13 LAB — POCT URINALYSIS DIPSTICK
Appearance: NEGATIVE
BILIRUBIN UA: NEGATIVE
GLUCOSE UA: NEGATIVE
Ketones, UA: NEGATIVE
Leukocytes, UA: NEGATIVE
Nitrite, UA: NEGATIVE
ODOR: NEGATIVE
PH UA: 6.5 (ref 5.0–8.0)
PROTEIN UA: NEGATIVE
RBC UA: NEGATIVE
SPEC GRAV UA: 1.01 (ref 1.010–1.025)
Urobilinogen, UA: 0.2 E.U./dL
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
## 2018-10-14 LAB — LIPID PANEL
Cholesterol: 224 mg/dL — ABNORMAL HIGH (ref <200)
HDL: 53 mg/dL (ref >50)
LDL Cholesterol (Calc): 152 mg/dL (calc) — ABNORMAL HIGH
NON-HDL CHOLESTEROL (CALC): 171 mg/dL — AB (ref <130)
Total CHOL/HDL Ratio: 4.2 (calc) (ref <5.0)
Triglycerides: 82 mg/dL (ref <150)

## 2018-10-17 ENCOUNTER — Encounter: Payer: Self-pay | Admitting: Allergy

## 2018-10-17 ENCOUNTER — Ambulatory Visit: Payer: BLUE CROSS/BLUE SHIELD | Admitting: Allergy

## 2018-10-17 VITALS — BP 114/64 | HR 64 | Resp 16 | Ht 61.0 in | Wt 128.0 lb

## 2018-10-17 DIAGNOSIS — J3089 Other allergic rhinitis: Secondary | ICD-10-CM | POA: Diagnosis not present

## 2018-10-17 DIAGNOSIS — L299 Pruritus, unspecified: Secondary | ICD-10-CM

## 2018-10-17 NOTE — Assessment & Plan Note (Signed)
Facial pruritus for the past 4 months. Initially started with whole body pruritus which resolved after stopping eye drop use. Denies any other changes in medications, diet or personal care products. No associated rash. CBC, CMP and TSH normal in September 2019.  Unable to skin test today due to recent antihistamine intake. Get bloodwork as below.  Based on clinical history, not sure what may be triggering this issue.  Take zyrtec 10mg  daily in the morning and at night. See if the itching improves.  Discussed proper skin care measures and samples of moisturizer and facial wash given.   If bloodwork is negative and pruritus is still persistent then may benefit from patch testing in future.

## 2018-10-17 NOTE — Patient Instructions (Addendum)
Get bloodwork  Take zyrtec 10mg  daily in the morning and at night. See if the itching improves.  Follow up in 4 weeks.  Skin care recommendations  Bath time: . Always use lukewarm water. AVOID very hot or cold water. Marland Kitchen Keep bathing time to 5-10 minutes. . Do NOT use bubble bath. . Use a mild soap and use just enough to wash the dirty areas. . Do NOT scrub skin vigorously.  . After bathing, pat dry your skin with a towel. Do NOT rub or scrub the skin.  Moisturizers and prescriptions:  . ALWAYS apply moisturizers immediately after bathing (within 3 minutes). This helps to lock-in moisture. . Use the moisturizer several times a day over the whole body. Kermit Balo summer moisturizers include: Aveeno, CeraVe, Cetaphil. Kermit Balo winter moisturizers include: Aquaphor, Vaseline, Cerave, Cetaphil, Eucerin, Vanicream. . When using moisturizers along with medications, the moisturizer should be applied about one hour after applying the medication to prevent diluting effect of the medication or moisturize around where you applied the medications. When not using medications, the moisturizer can be continued twice daily as maintenance.  Laundry and clothing: . Avoid laundry products with added color or perfumes. . Use unscented hypo-allergenic laundry products such as Tide free, Cheer free & gentle, and All free and clear.  . If the skin still seems dry or sensitive, you can try double-rinsing the clothes. . Avoid tight or scratchy clothing such as wool. . Do not use fabric softeners or dyer sheets.

## 2018-10-17 NOTE — Progress Notes (Signed)
New Patient Note  RE: [name] [name] MRN: [id number] DOB:02/05/1961 Date of Office Visit: 10/17/2018  Referring provider: Elby Showers, MD Primary care provider: Elby Showers, MD  Chief Complaint: Pruritis (facial)  History of Present Illness: I had the pleasure of seeing [name] [name] for initial evaluation at the [facility] on 10/17/2018. She is a 57 y.o. female, who is referred here by Elby Showers, MD for the evaluation of facial pruritus.   Patient has been having issues with facial pruritus including ear and nose for the past 4 months. Denies any associated erythema. She had cataract surgery in January and in March. Initially she thought it was from the eye drops which was started in April. Only took it for about 1.5 months before she developed whole body pruritus. She stopped the eye drops which resolved the body pruritus but not the facial pruritus. Currently not using any eye drops.  Only things that helps is the cold air and the heat seems to flare the symptoms.   Used benadryl for the itching with no benefit.   Denies any changes in diet, medications, personal care products. Currently using Cetaphil facial products and dove body wash which she used for many years. Does not use any make up. Patient dyes her hair twice a year. She did in August and last week.   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   Assessment and Plan: Adie is a 57 y.o. female with: Pruritus Facial pruritus for the past 4 months. Initially started with whole body pruritus which resolved after stopping eye drop use. Denies any other changes in medications, diet or personal care products. No associated rash. CBC, CMP and TSH normal in September 2019.  Unable to skin test today due to recent antihistamine intake. Get bloodwork as below.  Based on clinical history, not sure what may be triggering this issue.  Take zyrtec 10mg   daily in the morning and at night. See if the itching improves.  Discussed proper skin care measures and samples of moisturizer and facial wash given.   If bloodwork is negative and pruritus is still persistent then may benefit from patch testing in future.  Other allergic rhinitis Perennial rhinitis symptoms which is controlled with zyrtec 10mg  daily.  Get bloodwork as below.  Continue zyrtec 10mg  and increase to BID to see if it helps the pruritis.   Return in about 4 weeks (around 11/14/2018).  Lab Orders     Allergens w/Total IgE Area 2  Other allergy screening: Asthma: no Rhino conjunctivitis: yes Takes zyrtec year around with good benefit. No previous allergy testing. Food allergy: no Medication allergy: no Hymenoptera allergy: no Urticaria: no Eczema:no History of recurrent infections suggestive of immunodeficency: no  Diagnostics: Skin Testing: Unable to skin test today due to recent antihistamine intake.  Past Medical History: Patient Active Problem List   Diagnosis Date Noted  . Pruritus 10/17/2018  . Other allergic rhinitis 10/17/2018  . Severe episode of recurrent major depressive disorder, without psychotic features ([place]) 03/24/2016  . PTSD (post-traumatic stress disorder) 03/24/2016  . GAD (generalized anxiety disorder) 03/24/2016  . Functional constipation 08/05/2015  . Chest pain, atypical 06/13/2012  . Pleuritic pain 06/13/2012  . Tobacco abuse, in remission 06/13/2012  . Cough with intermittant hemoptysis 06/13/2012  . Back pain 06/13/2012  . Anxiety   . Bipolar disorder ([place])   . Migraine   . INFECTIOUS DIARRHEA 09/04/2010  .  HELICOBACTER PYLORI INFECTION 09/04/2010  . ABDOMINAL PAIN-LUQ 09/04/2010   Past Medical History:  Diagnosis Date  . Anxiety   . Arthritis   . Helicobacter pylori (H. pylori) 07/2010  . Migraine    Past Surgical History: Past Surgical History:  Procedure Laterality Date  . CATARACT EXTRACTION     bilateral  .  CERVICAL FUSION    . TUBAL LIGATION    . UPPER GASTROINTESTINAL ENDOSCOPY    . VAGINAL HYSTERECTOMY  2001   USO by history-sono2011 question of both ovaries present   Medication List:  Current Outpatient Medications  Medication Sig Dispense Refill  . cetirizine (ZYRTEC) 10 MG tablet Take 10 mg by mouth daily.    Marland Kitchen estradiol (VIVELLE-DOT) 0.1 MG/24HR patch APPLY 1 PATCH TWICE WEEKLY 24 patch 3  . Multiple Vitamin (MULTIVITAMIN) tablet Take 1 tablet by mouth daily.    Marland Kitchen topiramate (TOPAMAX) 200 MG tablet Take 400 mg by mouth daily.      No current facility-administered medications for this visit.    Allergies: No Known Allergies Social History: Social History   Socioeconomic History  . Marital status: Divorced    Spouse name: Not on file  . Number of children: 1  . Years of education: 60  . Highest education level: Not on file  Occupational History  . Not on file  Social Needs  . Financial resource strain: Not on file  . Food insecurity:    Worry: Not on file    Inability: Not on file  . Transportation needs:    Medical: Not on file    Non-medical: Not on file  Tobacco Use  . Smoking status: Former Smoker    Types: Cigarettes  . Smokeless tobacco: Never Used  Substance and Sexual Activity  . Alcohol use: Yes    Alcohol/week: 0.0 standard drinks    Comment: Very rare  . Drug use: No  . Sexual activity: Yes    Birth control/protection: Post-menopausal, Surgical    Comment: 1st intercourse 57 yo-More than 5 partners  Lifestyle  . Physical activity:    Days per week: Not on file    Minutes per session: Not on file  . Stress: Not on file  Relationships  . Social connections:    Talks on phone: Not on file    Gets together: Not on file    Attends religious service: Not on file    Active member of club or organization: Not on file    Attends meetings of clubs or organizations: Not on file    Relationship status: Not on file  Other Topics Concern  . Not on file    Social History Narrative   Pt went into the foster system at the age of 27. She as in 4 really bad foster homes. She was raised in [place]. Pt has one brother. Pt graduated HS. Pt lives in [place] and has one son and 2 daughters. Divorced x3. 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    Lives in a [address] year old home. Smoking: denies Occupation: Mudlogger HistoryFreight forwarder in the house: no Charity fundraiser in the family room: no Carpet in the bedroom: yes Heating: electric Cooling: central Pet: yes 1 cat x 3 yrs  Family History: Family History  Adopted: Yes  Problem Relation Age of Onset  . Hypertension Mother   . Mental illness Mother   . Hypertension Brother    Problem  Relation Asthma                                   Grandson  Eczema                                Grandson  Food allergy                          Grandson  Allergic rhino conjunctivitis     Daughters   Review of Systems  Constitutional: Negative for appetite change, chills, fever and unexpected weight change.  HENT: Negative for congestion and rhinorrhea.   Eyes: Negative for itching.  Respiratory: Negative for cough, chest tightness, shortness of breath and wheezing.   Cardiovascular: Negative for chest pain.  Gastrointestinal: Negative for abdominal pain.  Genitourinary: Negative for difficulty urinating.  Skin: Negative for rash.  Allergic/Immunologic: Negative for environmental allergies and food allergies.  Neurological: Negative for headaches.   Objective: BP 114/64   Pulse 64   Resp 16   Ht 5\' 1"  (1.549 m)   Wt 128 lb (58.1 kg)   LMP 11/02/2000   SpO2 97%   BMI 24.19 kg/m  Body mass index is 24.19 kg/m. Physical Exam  Constitutional: She is oriented to person, place, and time. She appears well-developed and well-nourished.  HENT:  Head: Normocephalic and atraumatic.  Right Ear: External ear normal.  Left Ear: External ear  normal.  Nose: Nose normal.  Mouth/Throat: Oropharynx is clear and moist.  Eyes: Conjunctivae and EOM are normal.  Neck: Neck supple.  Cardiovascular: Normal rate, regular rhythm and normal heart sounds. Exam reveals no gallop and no friction rub.  No murmur heard. Pulmonary/Chest: Effort normal and breath sounds normal. She has no wheezes. She has no rales.  Abdominal: Soft.  Lymphadenopathy:    She has no cervical adenopathy.  Neurological: She is alert and oriented to person, place, and time.  Skin: Skin is warm. No rash noted.  Psychiatric: She has a normal mood and affect. Her behavior is normal.  Nursing note and vitals reviewed.  The plan was reviewed with the patient/family, and all questions/concerned were addressed.  It was my pleasure to see Eleonore today and participate in her care. Please feel free to contact me with any questions or concerns.  Sincerely,  Rexene Alberts, DO Allergy & Immunology  [facility] office: [phone] [place]

## 2018-10-17 NOTE — Assessment & Plan Note (Addendum)
Perennial rhinitis symptoms which is controlled with zyrtec 10mg  daily.  Get bloodwork as below.  Continue zyrtec 10mg  and increase to BID to see if it helps the pruritis.

## 2018-10-19 ENCOUNTER — Encounter: Payer: Self-pay | Admitting: Allergy

## 2018-10-19 LAB — ALLERGENS W/TOTAL IGE AREA 2
Alternaria Alternata IgE: <0.1 kU/L
Cat Dander IgE: <0.1 kU/L
Cedar, Mountain IgE: <0.1 kU/L
Cockroach, German IgE: 0.1 kU/L — AB
Cottonwood IgE: <0.1 kU/L
D Farinae IgE: 0.28 kU/L — AB
D Pteronyssinus IgE: <0.1 kU/L
Elm, American IgE: <0.1 kU/L
IGE (IMMUNOGLOBULIN E), SERUM: 10 [IU]/mL (ref 6–495)
Maple/Box Elder IgE: <0.1 kU/L
Oak, White IgE: <0.1 kU/L
Pecan, Hickory IgE: <0.1 kU/L
Penicillium Chrysogen IgE: <0.1 kU/L
Pigweed, Rough IgE: <0.1 kU/L
Sheep Sorrel IgE Qn: <0.1 kU/L

## 2018-11-13 IMAGING — DX DG SHOULDER 2+V*R*
4 series · 4 of 4 positions shown · non-contrast
Comparison: None.

CLINICAL DATA: RIGHT shoulder pain, no injury.

EXAM:
RIGHT SHOULDER - 2+ VIEW

[shoulder ap]
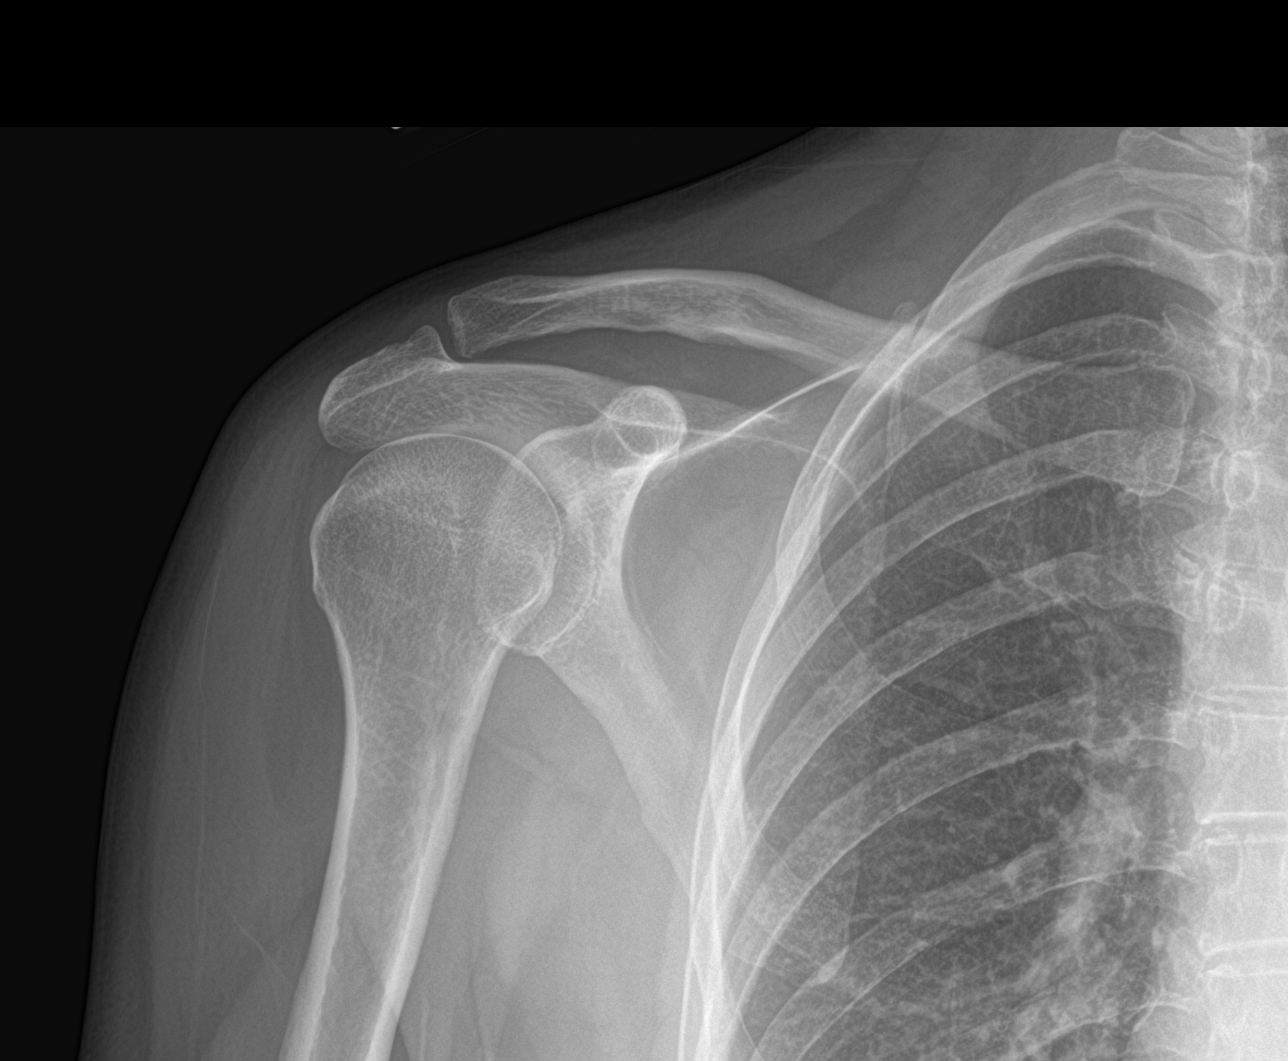

[shoulder grashey]
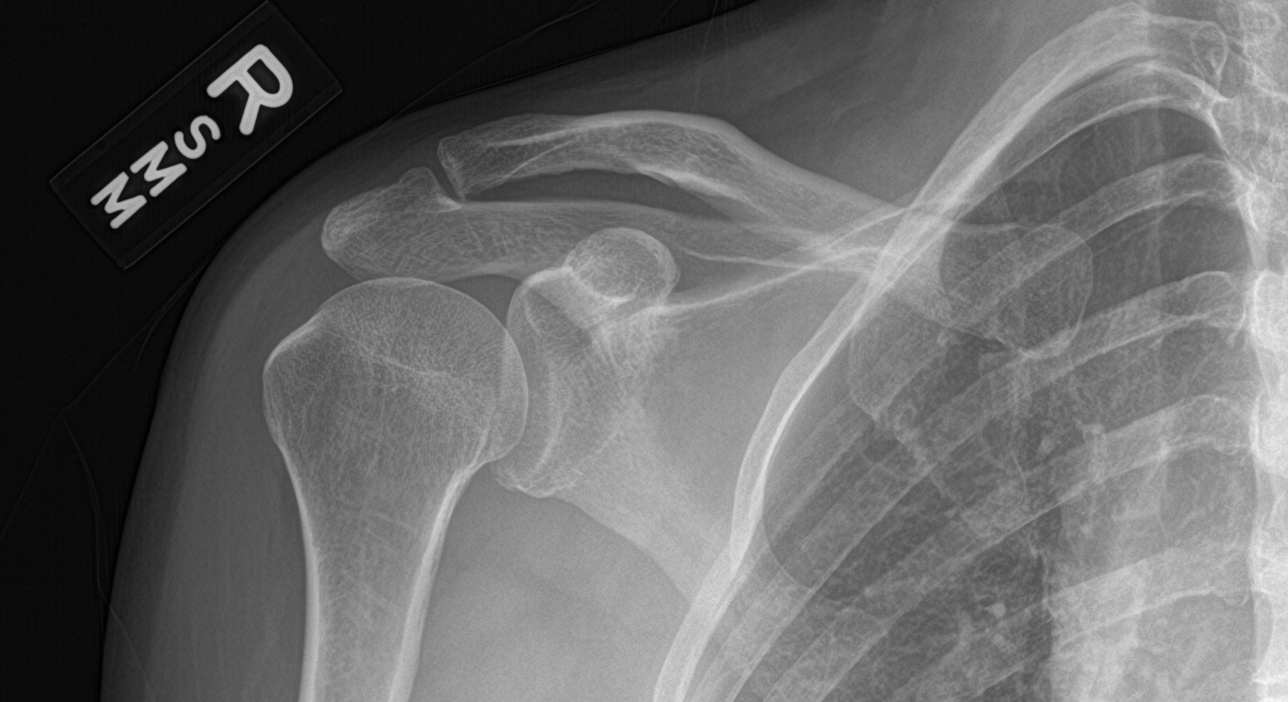

[shoulder y-view]
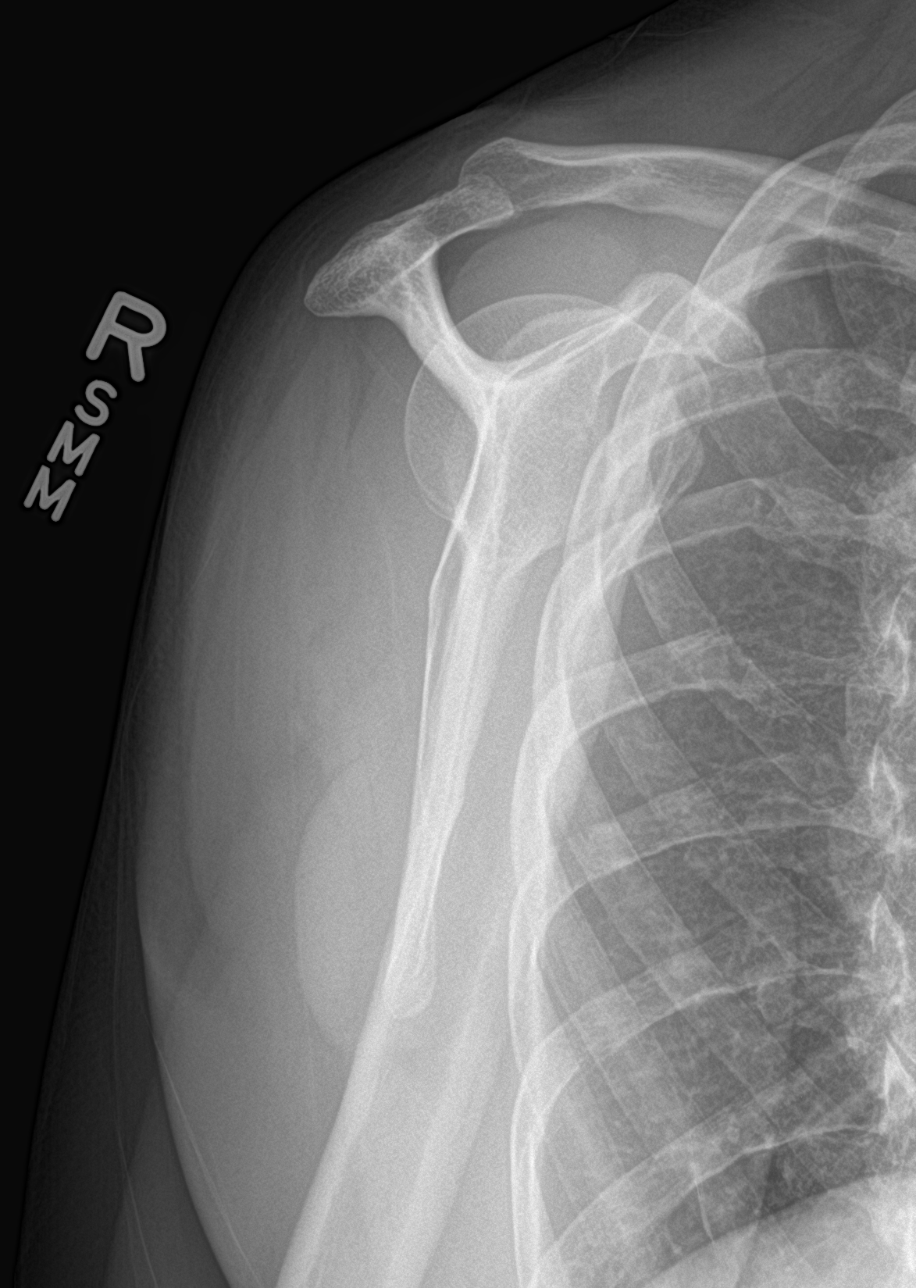

[shoulder axial]
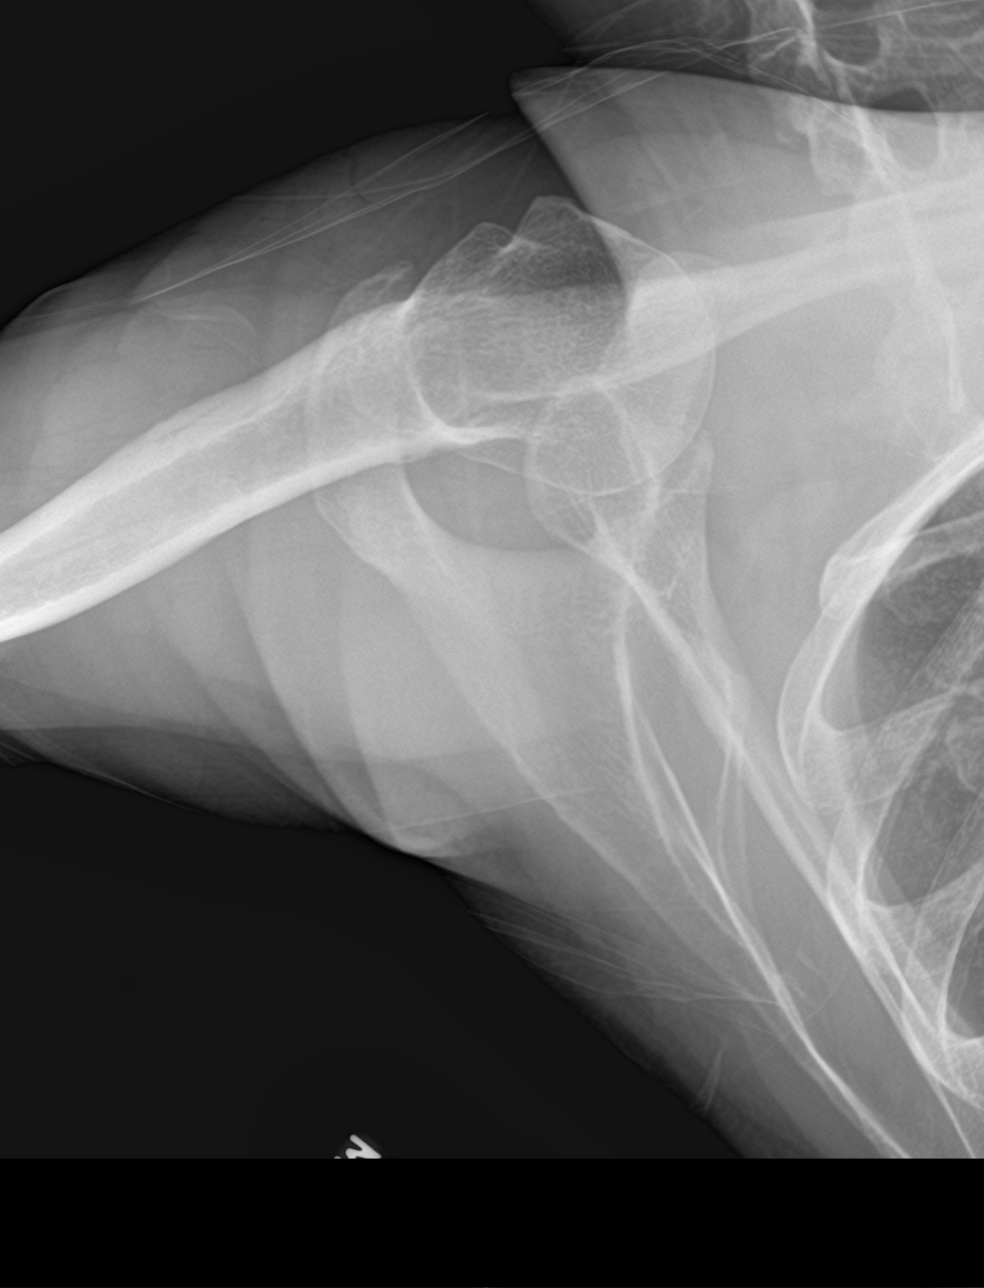

[4 of 4 positions shown; findings below may reference images not displayed]

FINDINGS: The humeral head is well-formed and located. The subacromial,
glenohumeral and acromioclavicular joint spaces are intact. No
destructive bony lesions. Soft tissue planes are non-suspicious.
IMPRESSION: Negative.

## 2018-11-16 ENCOUNTER — Ambulatory Visit: Payer: BLUE CROSS/BLUE SHIELD | Admitting: Allergy

## 2018-11-22 ENCOUNTER — Encounter: Payer: Self-pay | Admitting: Internal Medicine

## 2018-11-22 NOTE — Patient Instructions (Signed)
We will make referral to allergist for evaluation of pruritus.  Watch diet and try to exercise.  Repeat lipid panel in 1 year.

## 2019-02-22 ENCOUNTER — Telehealth: Payer: Self-pay

## 2019-02-22 ENCOUNTER — Encounter (HOSPITAL_COMMUNITY): Payer: Self-pay | Admitting: Pharmacy Technician

## 2019-02-22 ENCOUNTER — Emergency Department (HOSPITAL_COMMUNITY)
Admission: EM | Admit: 2019-02-22 | Discharge: 2019-02-22 | Disposition: A | Discharge status: Home / Self Care | Payer: BLUE CROSS/BLUE SHIELD | Attending: Emergency Medicine | Admitting: Emergency Medicine

## 2019-02-22 ENCOUNTER — Emergency Department (HOSPITAL_COMMUNITY): Payer: BLUE CROSS/BLUE SHIELD

## 2019-02-22 ENCOUNTER — Other Ambulatory Visit: Payer: Self-pay

## 2019-02-22 DIAGNOSIS — R531 Weakness: Secondary | ICD-10-CM | POA: Diagnosis not present

## 2019-02-22 DIAGNOSIS — Z87891 Personal history of nicotine dependence: Secondary | ICD-10-CM | POA: Diagnosis not present

## 2019-02-22 DIAGNOSIS — Z79899 Other long term (current) drug therapy: Secondary | ICD-10-CM | POA: Insufficient documentation

## 2019-02-22 DIAGNOSIS — R4182 Altered mental status, unspecified: Secondary | ICD-10-CM | POA: Diagnosis present

## 2019-02-22 DIAGNOSIS — M549 Dorsalgia, unspecified: Secondary | ICD-10-CM | POA: Diagnosis not present

## 2019-02-22 DIAGNOSIS — T428X1A Poisoning by antiparkinsonism drugs and other central muscle-tone depressants, accidental (unintentional), initial encounter: Secondary | ICD-10-CM | POA: Diagnosis not present

## 2019-02-22 LAB — COMPREHENSIVE METABOLIC PANEL
ALT: 13 U/L (ref 0–44)
AST: 15 U/L (ref 15–41)
Albumin: 3.7 g/dL (ref 3.5–5.0)
Alkaline Phosphatase: 33 U/L — ABNORMAL LOW (ref 38–126)
Anion gap: 9 (ref 5–15)
BUN: 13 mg/dL (ref 6–20)
CO2: 21 mmol/L — ABNORMAL LOW (ref 22–32)
Calcium: 9.4 mg/dL (ref 8.9–10.3)
Chloride: 112 mmol/L — ABNORMAL HIGH (ref 98–111)
Creatinine, Ser: 0.8 mg/dL (ref 0.44–1.00)
GFR calc Af Amer: >60 mL/min (ref >60)
GFR calc non Af Amer: >60 mL/min (ref >60)
Glucose, Bld: 100 mg/dL — ABNORMAL HIGH (ref 70–99)
Potassium: 3.7 mmol/L (ref 3.5–5.1)
Sodium: 142 mmol/L (ref 135–145)
Total Bilirubin: 0.5 mg/dL (ref 0.3–1.2)
Total Protein: 7.1 g/dL (ref 6.5–8.1)

## 2019-02-22 LAB — URINALYSIS, ROUTINE W REFLEX MICROSCOPIC
Bilirubin Urine: NEGATIVE
Glucose, UA: NEGATIVE mg/dL
Hgb urine dipstick: NEGATIVE
Ketones, ur: NEGATIVE mg/dL
Nitrite: NEGATIVE
Protein, ur: NEGATIVE mg/dL
Specific Gravity, Urine: 1.01 (ref 1.005–1.030)
pH: 6 (ref 5.0–8.0)

## 2019-02-22 LAB — RAPID URINE DRUG SCREEN, HOSP PERFORMED
Amphetamines: NOT DETECTED
Barbiturates: NOT DETECTED
Benzodiazepines: NOT DETECTED
Cocaine: NOT DETECTED
Opiates: NOT DETECTED
Tetrahydrocannabinol: NOT DETECTED

## 2019-02-22 LAB — CBC WITH DIFFERENTIAL/PLATELET
Abs Immature Granulocytes: 0.02 10*3/uL (ref 0.00–0.07)
Basophils Absolute: 0 10*3/uL (ref 0.0–0.1)
Basophils Relative: 0 %
Eosinophils Absolute: 0.1 10*3/uL (ref 0.0–0.5)
Eosinophils Relative: 1 %
HCT: 38.9 % (ref 36.0–46.0)
Hemoglobin: 12.9 g/dL (ref 12.0–15.0)
Immature Granulocytes: 0 %
Lymphocytes Relative: 29 %
Lymphs Abs: 2 10*3/uL (ref 0.7–4.0)
MCH: 30.7 pg (ref 26.0–34.0)
MCHC: 33.2 g/dL (ref 30.0–36.0)
MCV: 92.6 fL (ref 80.0–100.0)
Monocytes Absolute: 0.6 10*3/uL (ref 0.1–1.0)
Monocytes Relative: 8 %
Neutro Abs: 4.4 10*3/uL (ref 1.7–7.7)
Neutrophils Relative %: 62 %
Platelets: 173 10*3/uL (ref 150–400)
RBC: 4.2 MIL/uL (ref 3.87–5.11)
RDW: 13 % (ref 11.5–15.5)
WBC: 7.1 10*3/uL (ref 4.0–10.5)
nRBC: 0 % (ref 0.0–0.2)

## 2019-02-22 LAB — ACETAMINOPHEN LEVEL: Acetaminophen (Tylenol), Serum: <10 ug/mL — ABNORMAL LOW (ref 10–30)

## 2019-02-22 LAB — CBG MONITORING, ED: Glucose-Capillary: 61 mg/dL — ABNORMAL LOW (ref 70–99)

## 2019-02-22 LAB — SALICYLATE LEVEL: Salicylate Lvl: <7 mg/dL (ref 2.8–30.0)

## 2019-02-22 LAB — ETHANOL: Alcohol, Ethyl (B): <10 mg/dL (ref <10)

## 2019-02-22 MED ORDER — SODIUM CHLORIDE 0.9 % IV SOLN
INTRAVENOUS | Status: DC
  Administered 2019-02-22: 12:00:00 via INTRAVENOUS

## 2019-02-22 MED ORDER — IBUPROFEN 400 MG PO TABS
600.0000 mg | ORAL_TABLET | Freq: Once | ORAL | Status: AC
  Administered 2019-02-22: 600 mg via ORAL
  Filled 2019-02-22: qty 1

## 2019-02-22 MED ORDER — SODIUM CHLORIDE 0.9 % IV BOLUS
1000.0000 mL | Freq: Once | INTRAVENOUS | Status: AC
  Administered 2019-02-22: 1000 mL via INTRAVENOUS

## 2019-02-22 NOTE — ED Notes (Signed)
Pt ambulating to bathroom without assistance or difficulty.

## 2019-02-22 NOTE — ED Triage Notes (Signed)
Pt family member states pt took 6 baclofen 10mg  tablets yesterday on accident. Pt family states pt was "passed out" for a while yesterday. Pt only complaint today is back pain and indigestion. Pt alert and oriented.

## 2019-02-22 NOTE — Telephone Encounter (Signed)
Spoke with [name] [name] and they were on their way to ED and she also said that they were on the phone with Poison Control because [name] had taken 6 of the Baclofen at one time.

## 2019-02-22 NOTE — ED Triage Notes (Signed)
Poison control called PTA, reported that patient family called due to patient taking an unknown amount of baclofin, she was seen for back pain and was supposed to take 6 prednisone tablets but instead took the baclofin, then possibly took more later, family reported confusion and altered mental status, also chest pain. Sent for EKG due to possible prolonged QT and further evaluation

## 2019-02-22 NOTE — ED Provider Notes (Signed)
[facility] EMERGENCY DEPARTMENT Provider Note   CSN: [id number] Arrival date & time: 02/22/19  1108    History   Chief Complaint No chief complaint on file.   HPI [name] [name] is a 58 y.o. female.     Pt presents to the ED today with AMS.  The pt went to an urgent care yesterday for her back pain.  She was given a rx for prednisone and baclofen.  She was supposed to take 6 prednisone to start, but accidentally took 6 baclofen around noon yesterday.  She took an additional 2 in the evening.  Since then, she's been confused.  The pt feels strange.  She has not eaten today.  Her back still hurts.     Past Medical History:  Diagnosis Date  . Anxiety   . Arthritis   . Helicobacter pylori (H. pylori) 07/2010  . Migraine     Patient Active Problem List   Diagnosis Date Noted  . Pruritus 10/17/2018  . Other allergic rhinitis 10/17/2018  . Severe episode of recurrent major depressive disorder, without psychotic features ([place]) 03/24/2016  . PTSD (post-traumatic stress disorder) 03/24/2016  . GAD (generalized anxiety disorder) 03/24/2016  . Functional constipation 08/05/2015  . Chest pain, atypical 06/13/2012  . Pleuritic pain 06/13/2012  . Tobacco abuse, in remission 06/13/2012  . Cough with intermittant hemoptysis 06/13/2012  . Back pain 06/13/2012  . Anxiety   . Bipolar disorder ([place])   . Migraine   . INFECTIOUS DIARRHEA 09/04/2010  . HELICOBACTER PYLORI INFECTION 09/04/2010  . ABDOMINAL PAIN-LUQ 09/04/2010    Past Surgical History:  Procedure Laterality Date  . CATARACT EXTRACTION     bilateral  . CERVICAL FUSION    . TUBAL LIGATION    . UPPER GASTROINTESTINAL ENDOSCOPY    . VAGINAL HYSTERECTOMY  2001   USO by history-sono2011 question of both ovaries present     OB History    Gravida  4   Para  3   Term      Preterm      AB  1   Living  3     SAB  1   TAB      Ectopic      Multiple      Live Births              Home Medications    Prior to Admission medications   Medication Sig Start Date End Date Taking? Authorizing Provider  cetirizine (ZYRTEC) 10 MG tablet Take 10 mg by mouth daily.   Yes [provider]  estradiol (VIVELLE-DOT) 0.1 MG/24HR patch APPLY 1 PATCH TWICE WEEKLY Patient taking differently: Place 1 patch onto the skin 2 (two) times a week.  07/06/18  Yes Fontaine, Belinda Block, MD  Multiple Vitamin (MULTIVITAMIN) tablet Take 1 tablet by mouth daily.   Yes [provider]  predniSONE (DELTASONE) 10 MG tablet Take 10 mg by mouth See admin instructions. Take 6 tablets by mouth on day one, 5 on day two, 4 on day three, 3 on day four, 2 on day five and 1 tablet on day six. 02/21/19  Yes [provider]  topiramate (TOPAMAX) 200 MG tablet Take 400 mg by mouth daily.    Yes [provider]  baclofen (LIORESAL) 10 MG tablet Take 5-10 mg by mouth every 8 (eight) hours as needed for muscle spasms.  02/21/19   [provider]    Family History Family History  Adopted:  Yes  Problem Relation Age of Onset  . Hypertension Mother   . Mental illness Mother   . Hypertension Brother     Social History Social History   Tobacco Use  . Smoking status: Former Smoker    Types: Cigarettes  . Smokeless tobacco: Never Used  Substance Use Topics  . Alcohol use: Yes    Alcohol/week: 0.0 standard drinks    Comment: Very rare  . Drug use: No     Allergies   Patient has no known allergies.   Review of Systems Review of Systems  Musculoskeletal: Positive for back pain.  Neurological: Positive for weakness.  All other systems reviewed and are negative.    Physical Exam Updated Vital Signs BP 126/81   Pulse 81   Temp 98.1 F (36.7 C)   Resp 19   LMP 11/02/2000   SpO2 99%   Physical Exam Vitals signs and nursing note reviewed.  Constitutional:      Appearance: Normal appearance.  HENT:     Head: Normocephalic and atraumatic.     Right  Ear: External ear normal.     Left Ear: External ear normal.     Nose: Nose normal.     Mouth/Throat:     Mouth: Mucous membranes are dry.  Eyes:     Extraocular Movements: Extraocular movements intact.     Conjunctiva/sclera: Conjunctivae normal.     Pupils: Pupils are equal, round, and reactive to light.  Neck:     Musculoskeletal: Normal range of motion and neck supple.  Cardiovascular:     Rate and Rhythm: Normal rate and regular rhythm.     Pulses: Normal pulses.     Heart sounds: Normal heart sounds.  Pulmonary:     Effort: Pulmonary effort is normal.     Breath sounds: Normal breath sounds.  Abdominal:     General: Abdomen is flat. Bowel sounds are normal.     Palpations: Abdomen is soft.  Musculoskeletal: Normal range of motion.  Skin:    General: Skin is warm and dry.     Capillary Refill: Capillary refill takes less than 2 seconds.  Neurological:     General: No focal deficit present.     Mental Status: She is alert and oriented to person, place, and time.  Psychiatric:        Mood and Affect: Mood normal.        Behavior: Behavior normal.      ED Treatments / Results  Labs (all labs ordered are listed, but only abnormal results are displayed) Labs Reviewed  COMPREHENSIVE METABOLIC PANEL - Abnormal; Notable for the following components:      Result Value   Chloride 112 (*)    CO2 21 (*)    Glucose, Bld 100 (*)    Alkaline Phosphatase 33 (*)    All other components within normal limits  ACETAMINOPHEN LEVEL - Abnormal; Notable for the following components:   Acetaminophen (Tylenol), Serum <10 (*)    All other components within normal limits  URINALYSIS, ROUTINE W REFLEX MICROSCOPIC - Abnormal; Notable for the following components:   Leukocytes,Ua TRACE (*)    Bacteria, UA RARE (*)    All other components within normal limits  CBG MONITORING, ED - Abnormal; Notable for the following components:   Glucose-Capillary 61 (*)    All other components within  normal limits  SALICYLATE LEVEL  ETHANOL  CBC WITH DIFFERENTIAL/PLATELET  RAPID URINE DRUG SCREEN, HOSP PERFORMED    EKG  EKG Interpretation  Date/Time:  Wednesday February 22 2019 11:38:20 EDT Ventricular Rate:  64 PR Interval:    QRS Duration: 112 QT Interval:  413 QTC Calculation: 427 R Axis:   48 Text Interpretation:  Sinus rhythm Borderline intraventricular conduction delay Borderline T abnormalities, anterior leads No significant change since last tracing Confirmed by Isla Pence [id number]) on 02/22/2019 11:47:13 AM   Radiology Ct Head Wo Contrast  Result Date: 02/22/2019 CLINICAL DATA:  Possible accidental overdose with baclofen. EXAM: CT HEAD WITHOUT CONTRAST TECHNIQUE: Contiguous axial images were obtained from the base of the skull through the vertex without intravenous contrast. COMPARISON:  Head CT scan 11/17/2014. FINDINGS: Brain: No evidence of acute infarction, hemorrhage, hydrocephalus, extra-axial collection or mass lesion/mass effect. Vascular: No hyperdense vessel or unexpected calcification. Skull: Normal. Negative for fracture or focal lesion. Sinuses/Orbits: Negative. Other: None. IMPRESSION: Negative head CT. Electronically Signed   By: Inge Rise M.D.   On: 02/22/2019 12:15   Dg Chest Port 1 View  Result Date: 02/22/2019 CLINICAL DATA:  Chest pain EXAM: PORTABLE CHEST 1 VIEW COMPARISON:  January 23, 2014 FINDINGS: Lungs are clear. The heart size and pulmonary vascularity are normal. No adenopathy. No pneumothorax. There is postoperative change in the lower cervical spine. IMPRESSION: No edema or consolidation. Electronically Signed   By: Lowella Grip III M.D.   On: 02/22/2019 11:43    Procedures Procedures (including critical care time)  Medications Ordered in ED Medications  sodium chloride 0.9 % bolus 1,000 mL (1,000 mLs Intravenous New Bag/Given 02/22/19 1140)    And  0.9 %  sodium chloride infusion ( Intravenous New Bag/Given 02/22/19 1147)   ibuprofen (ADVIL) tablet 600 mg (600 mg Oral Given 02/22/19 1354)     Initial Impression / Assessment and Plan / ED Course  I have reviewed the triage vital signs and the nursing notes.  Pertinent labs & imaging results that were available during my care of the patient were reviewed by me and considered in my medical decision making (see chart for details).       Pt observed for several hours.  She looks good and is able to tolerate po fluids.  Last pill around 1800 yesterday, so it should be out of her system by now.  She is awake and alert.  She is stable for d/c.  She knows to avoid Baclofen.  Final Clinical Impressions(s) / ED Diagnoses   Final diagnoses:  Baclofen overdose, accidental or unintentional, initial encounter    ED Discharge Orders    None       Isla Pence, MD 02/22/19 [phone]

## 2019-02-22 NOTE — Telephone Encounter (Signed)
Patient's son called, [facility] and she was given a depo medrol shot, was prescribed prednisone tapering 6,5,4,3,2,1 and baclofen [name] noticed that his mom was disoriented and had nausea last night, this morning she still seems disoriented so he called [facility] to see if this was a medication reaction and they were advised to go to the ED, he called to get your opinion whether he needs to take her to the ER. Patient is not vomiting no headache he he knows of.

## 2019-02-22 NOTE — Telephone Encounter (Signed)
Thi may be a reaction to Baclofen but best to take her to ED.

## 2019-02-22 NOTE — ED Notes (Signed)
Daughter, Merril Abbe, [phone].

## 2019-02-23 ENCOUNTER — Encounter: Payer: Self-pay | Admitting: Internal Medicine

## 2019-02-23 ENCOUNTER — Ambulatory Visit (INDEPENDENT_AMBULATORY_CARE_PROVIDER_SITE_OTHER): Payer: BLUE CROSS/BLUE SHIELD | Admitting: Internal Medicine

## 2019-02-23 DIAGNOSIS — M5442 Lumbago with sciatica, left side: Secondary | ICD-10-CM

## 2019-02-23 DIAGNOSIS — M5441 Lumbago with sciatica, right side: Secondary | ICD-10-CM | POA: Diagnosis not present

## 2019-02-23 DIAGNOSIS — T50901S Poisoning by unspecified drugs, medicaments and biological substances, accidental (unintentional), sequela: Secondary | ICD-10-CM

## 2019-02-23 DIAGNOSIS — Z8659 Personal history of other mental and behavioral disorders: Secondary | ICD-10-CM | POA: Diagnosis not present

## 2019-02-23 MED ORDER — CYCLOBENZAPRINE HCL 10 MG PO TABS: 10.0000 mg | ORAL_TABLET | Freq: Three times a day (TID) | ORAL | 0 refills | Status: DC | PRN

## 2019-02-23 MED ORDER — INDOMETHACIN 50 MG PO CAPS: 50.0000 mg | ORAL_CAPSULE | Freq: Three times a day (TID) | ORAL | 0 refills | Status: DC

## 2019-02-23 NOTE — Telephone Encounter (Signed)
[name] [name] called to schedule follow up visit from her ED visit yesterday.

## 2019-02-23 NOTE — Telephone Encounter (Signed)
Scheduled Virtual Visit °

## 2019-02-23 NOTE — Progress Notes (Signed)
Subjective:    Patient ID: [name] [name], female    DOB: 04-09-1961, 58 y.o.   MRN: [id number]  HPI 58 year old Female in today for Emergency Department follow-up visit which occurred yesterday.  Coronavirus pandemic, interactive audio and visual communications were achieved between patient and this provider.  Patient consents to this form of visit today.  She is identified by 2 identifiers as [name] [name]. Timmothy Euler, a longstanding patient in this practice  Patient was seen at [facility] on Tuesday, April 21.  She had been experiencing some low back pain radiating into both legs.  She has been taking care of a close family friend which is actually her ex-husband's father who has terminal lung cancer.  She tried to move him up in the [place] and strained her back.  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  Her son is also staying with him.  Hospice will be coming to help out soon.  At the urgent care center, she was given a short course of prednisone and baclofen.  Patient indicated she has had lower back pain since Sunday, April 19.  We have records from the urgent care to review.  She was prescribed baclofen and prednisone.  The prednisone was a 6-day taper going from 60 mg to 0 mg.  Baclofen was prescribed as 10 mg 1/2 to 1 tablet every 8 hours #21 with no refill.  For some reason she thought baclofen was prednisone and actually took multiple baclofen tablets.  She became disoriented and lethargic.  She has a history of not doing well with prednisone.  It tends to make her hyper and not sleep.  She has a history of bipolar disorder.  Family members realized she was confused and took her to the emergency department where she was seen around 11:00 yesterday morning.  She took apparently 6 baclofen around noon on April 21 and 2 additional baclofen tablets in the evening.  Still complaining of low back pain.  She was evaluated in the emergency department  and was found to be stable.  She says this was a complete mistake and she was not trying to harm herself.  She had a CT of the head without contrast showing no evidence of hemorrhage or acute infarction.  Acetaminophen level was less than 10.  CBC was normal.  Salicylate level was less than 7.  Ethanol level was less than 10 CO2 was 21.  Rapid drug screen was negative for opiates cocaine benzodiazepines amphetamines THC and barbiturates.  Capillary glucose was 61.  Was diagnosed with unintentional overdose and sent home.    Review of Systems patient says she still feels groggy today.  She is reluctant to take prednisone again.  She says that urgent care referred her to [facility] but appointment is not until early May apparently.  I would prefer she consider physical therapy but she does not want to do that.  We have agreed she will try an anti-inflammatory medication and Flexeril which she has taken in the past.     Objective:   Physical Exam  She looks fine today.  She is alert and oriented in no acute distress sitting.  She gives a clear concise history.  She does not appear to be confused.      Assessment & Plan:  Low back strain  Plan: Indocin 50 mg 3 times a day with meals for 7 days.  Flexeril 10 mg  1/2 to 1 tablet up to 3 times daily as needed for muscle spasm.  Heating pad. Advised her to walk some which I think will help with spasm.  Consider physical therapy if not improving.  25 minutes spent with patient and his family today including reviewing records from emergency department, reviewing records from urgent care center, and discussing these events with the patient and her present plan of action to go follow her.  Also time given to assessing whether or not she took this overdose intentionally.  It is my feeling that she took baclofen thinking it was prednisone in a tapering course.

## 2019-02-23 NOTE — Telephone Encounter (Signed)
This can be a virtual visit

## 2019-03-02 NOTE — Patient Instructions (Addendum)
It is my feeling that the event was an unintentional overdose of baclofen thinking it was prednisone tapering course.  Patient may try Indocin instead of prednisone and Flexeril instead of baclofen.  Consider physical therapy if not improving.

## 2019-04-24 ENCOUNTER — Encounter: Payer: Self-pay | Admitting: Internal Medicine

## 2019-04-24 ENCOUNTER — Ambulatory Visit: Payer: BLUE CROSS/BLUE SHIELD | Admitting: Internal Medicine

## 2019-04-24 ENCOUNTER — Telehealth: Payer: Self-pay | Admitting: Internal Medicine

## 2019-04-24 ENCOUNTER — Other Ambulatory Visit: Payer: Self-pay

## 2019-04-24 VITALS — BP 122/78 | HR 72 | Temp 98.4°F | Ht 61.0 in | Wt 133.0 lb

## 2019-04-24 DIAGNOSIS — Z8659 Personal history of other mental and behavioral disorders: Secondary | ICD-10-CM | POA: Diagnosis not present

## 2019-04-24 DIAGNOSIS — F4321 Adjustment disorder with depressed mood: Secondary | ICD-10-CM | POA: Diagnosis not present

## 2019-04-24 MED ORDER — VENLAFAXINE HCL ER 75 MG PO CP24: ORAL_CAPSULE | ORAL | 0 refills | Status: DC

## 2019-04-24 NOTE — Telephone Encounter (Signed)
[name] [name] [phone]  Apolonio Schneiders called to see if she could get an appointment to talk with you about depression. She is okay with virtual visit.

## 2019-04-24 NOTE — Telephone Encounter (Signed)
Ask her to come in today

## 2019-04-24 NOTE — Telephone Encounter (Signed)
Scheduled appointment

## 2019-04-26 NOTE — Progress Notes (Signed)
   Subjective:    Patient ID: [name] [name], female    DOB: 08-May-1961, 58 y.o.   MRN: [id number]  HPI 58 year old Female, longstanding patient in this practice seen in person today for depression.  Patient carries a diagnosis of bipolar disorder and was under the care of psychiatric nurse practitioner in the past.  Recently, her son's grandparents both passed away within a short period of time.  [name] [name] was always accepted by the illness family.  This is been difficult for her.  She took care of some grandfather at the end of his life and he passed away due to cancer.  Her son [name] [name], came back from [place] where he had a job to take care of his grandfather is well.  They have been left well off financially.  Patient has a cleaning service.  However she is crying in the office today saying that she is quite depressed and does not really want to go out and do things or see people.  She denies suicidal ideations.  She was seen at [facility] in April with a reported medication overdose of baclofen due to back pain.  She has a history of migraine headaches and is been on Topamax.  We talked about various antidepressants that she is tried in the past.  She thinks she might like to be on Effexor.  I think she should have some grief counseling.  She is not sure she wants to do that.    Review of Systems see above     Objective:   Physical Exam I spent 25 minutes with her today listening to details of recent events in her life, offering consolation and advice for moving forward.  Suggested counseling.       Assessment & Plan:  Grief reaction  Situational depression  Plan: Patient will start Effexor XR 75 mg daily and follow-up in 2 weeks or sooner if necessary.  Recommend counseling.

## 2019-04-26 NOTE — Patient Instructions (Addendum)
Effexor XR 75 mg daily.  Follow-up in 2 weeks.  Recommend counseling.

## 2019-05-08 ENCOUNTER — Encounter: Payer: Self-pay | Admitting: Internal Medicine

## 2019-05-08 ENCOUNTER — Other Ambulatory Visit: Payer: Self-pay

## 2019-05-08 ENCOUNTER — Ambulatory Visit (INDEPENDENT_AMBULATORY_CARE_PROVIDER_SITE_OTHER): Payer: BLUE CROSS/BLUE SHIELD | Admitting: Internal Medicine

## 2019-05-08 VITALS — BP 100/70 | HR 60 | Ht 61.0 in | Wt 131.0 lb

## 2019-05-08 DIAGNOSIS — F4321 Adjustment disorder with depressed mood: Secondary | ICD-10-CM | POA: Diagnosis not present

## 2019-05-08 DIAGNOSIS — F332 Major depressive disorder, recurrent severe without psychotic features: Secondary | ICD-10-CM | POA: Diagnosis not present

## 2019-05-16 ENCOUNTER — Other Ambulatory Visit: Payer: Self-pay | Admitting: Internal Medicine

## 2019-05-27 NOTE — Patient Instructions (Signed)
Continue Effexor XR 75 mg daily and follow-up in 3 to 6 months.

## 2019-05-27 NOTE — Progress Notes (Signed)
   Subjective:    Patient ID: [name] [name], female    DOB: [birth year], 58 y.o.   MRN: [id number]  HPI 58 year old Female seen today for follow-up of depression.  Patient has history of bipolar disorder.  Does not tolerate many medications very well for depression.    At last visit we started her on Effexor XR 75 mg daily and she is done well with that and is feeling much better.  Please see dictation from June 22 regarding situational stress leading to recurrence of depression.  This was surrounding a death in the family.  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  Has not wanted to go to counseling.    Review of Systems see above no new complaints     Objective:   Physical Exam  Vital signs reviewed      Assessment & Plan:  Overall she seems much calmer in much better mentally today.  Thinks this dose is about like for her.  Does not want to increase her dose at the present time.  Okay to refill Effexor XR 75 mg and follow-up in 3 to 6 months.

## 2019-06-19 ENCOUNTER — Other Ambulatory Visit: Payer: Self-pay

## 2019-06-19 ENCOUNTER — Ambulatory Visit (INDEPENDENT_AMBULATORY_CARE_PROVIDER_SITE_OTHER): Payer: BLUE CROSS/BLUE SHIELD | Admitting: Internal Medicine

## 2019-06-19 ENCOUNTER — Encounter: Payer: Self-pay | Admitting: Internal Medicine

## 2019-06-19 ENCOUNTER — Ambulatory Visit
Admission: RE | Admit: 2019-06-19 | Discharge: 2019-06-19 | Disposition: A | Discharge status: Home / Self Care | Payer: BLUE CROSS/BLUE SHIELD | Source: Ambulatory Visit | Attending: Internal Medicine | Admitting: Internal Medicine

## 2019-06-19 VITALS — BP 120/80 | HR 56 | Temp 98.3°F | Ht 61.0 in | Wt 129.0 lb

## 2019-06-19 DIAGNOSIS — F4321 Adjustment disorder with depressed mood: Secondary | ICD-10-CM | POA: Diagnosis not present

## 2019-06-19 DIAGNOSIS — L608 Other nail disorders: Secondary | ICD-10-CM

## 2019-06-19 NOTE — Progress Notes (Signed)
   Subjective:    Patient ID: [name] [name], female    DOB: 1960-12-17, 58 y.o.   MRN: [id number]  HPI  58 year old Female to follow up on depression.  She was here in June with situational depression regarding death in family.  She was started on Effexor.  Currently on XR 75 mg daily.  Was rechecked early July and was felt to be doing much better.  She did not want to increase her dose.  She is here today for follow-up.  Still feels dose of Effexor XR 75 mg daily is good for her.  She has a much brighter affect and I think is recovering from grief reaction and depression.  Has noticed some discoloration of second toe.  Does not have any history of injury or dropping any items on the time that she is aware of.  It does not appear to be a fungal infection just some mild bluish discoloration.    Review of Systems     Objective:   Physical Exam Vital signs are stable and reviewed.  She has bluish discoloration right second toe.  I do not know what this is but think it is probably benign.  Pulses in feet are normal. Affect is normal.  Seems happier.      Assessment & Plan:  Depression in remission on Effexor XR 75 mg daily.  It is my opinion she should stay on this indefinitely.  Bluish discoloration toenail right second toe-etiology unclear but think that is probably benign.  Will have x-ray.  Can see dermatologist if she so desires.  Plan: Schedule physical examination for December.  Have annual flu vaccine early Fall.

## 2019-06-19 NOTE — Patient Instructions (Addendum)
Have Xray of right second toe. If no evidence of fracture, see dermatologist. Continue Effexor for depression. CPE booked for December.

## 2019-07-03 ENCOUNTER — Other Ambulatory Visit: Payer: Self-pay | Admitting: Internal Medicine

## 2019-08-01 ENCOUNTER — Encounter: Payer: Self-pay | Admitting: Gynecology

## 2019-08-21 ENCOUNTER — Other Ambulatory Visit: Payer: Self-pay

## 2019-08-22 ENCOUNTER — Encounter: Payer: BLUE CROSS/BLUE SHIELD | Admitting: Gynecology

## 2019-10-09 ENCOUNTER — Other Ambulatory Visit: Payer: BLUE CROSS/BLUE SHIELD | Admitting: Internal Medicine

## 2019-10-09 ENCOUNTER — Other Ambulatory Visit: Payer: Self-pay

## 2019-10-09 DIAGNOSIS — E7800 Pure hypercholesterolemia, unspecified: Secondary | ICD-10-CM

## 2019-10-09 DIAGNOSIS — F411 Generalized anxiety disorder: Secondary | ICD-10-CM

## 2019-10-09 DIAGNOSIS — Z8669 Personal history of other diseases of the nervous system and sense organs: Secondary | ICD-10-CM

## 2019-10-09 DIAGNOSIS — Z0000 Encounter for general adult medical examination without abnormal findings: Secondary | ICD-10-CM

## 2019-10-09 DIAGNOSIS — Z1321 Encounter for screening for nutritional disorder: Secondary | ICD-10-CM

## 2019-10-09 DIAGNOSIS — Z8659 Personal history of other mental and behavioral disorders: Secondary | ICD-10-CM

## 2019-10-09 DIAGNOSIS — Z1329 Encounter for screening for other suspected endocrine disorder: Secondary | ICD-10-CM

## 2019-10-09 NOTE — Addendum Note (Signed)
Addended by: Mady Haagensen on: 10/09/2019 09:14 AM   Modules accepted: Orders

## 2019-10-10 LAB — CBC WITH DIFFERENTIAL/PLATELET
Absolute Monocytes: 470 cells/uL (ref 200–950)
Basophils Absolute: 40 cells/uL (ref 0–200)
Basophils Relative: 0.8 %
Eosinophils Absolute: 100 cells/uL (ref 15–500)
Eosinophils Relative: 2 %
HCT: 41.7 % (ref 35.0–45.0)
Hemoglobin: 13.6 g/dL (ref 11.7–15.5)
Lymphs Abs: 1675 cells/uL (ref 850–3900)
MCH: 30.8 pg (ref 27.0–33.0)
MCHC: 32.6 g/dL (ref 32.0–36.0)
MCV: 94.6 fL (ref 80.0–100.0)
MPV: 10.6 fL (ref 7.5–12.5)
Monocytes Relative: 9.4 %
Neutro Abs: 2715 cells/uL (ref 1500–7800)
Neutrophils Relative %: 54.3 %
Platelets: 197 10*3/uL (ref 140–400)
RBC: 4.41 10*6/uL (ref 3.80–5.10)
RDW: 12 % (ref 11.0–15.0)
Total Lymphocyte: 33.5 %
WBC: 5 10*3/uL (ref 3.8–10.8)

## 2019-10-10 LAB — COMPLETE METABOLIC PANEL WITH GFR
AG Ratio: 1.3 (calc) (ref 1.0–2.5)
ALT: 11 U/L (ref 6–29)
AST: 15 U/L (ref 10–35)
Albumin: 3.9 g/dL (ref 3.6–5.1)
Alkaline phosphatase (APISO): 39 U/L (ref 37–153)
BUN: 14 mg/dL (ref 7–25)
CO2: 24 mmol/L (ref 20–32)
Calcium: 9 mg/dL (ref 8.6–10.4)
Chloride: 111 mmol/L — ABNORMAL HIGH (ref 98–110)
Creat: 0.82 mg/dL (ref 0.50–1.05)
GFR, Est African American: 92 mL/min/{1.73_m2} (ref >=60)
GFR, Est Non African American: 79 mL/min/{1.73_m2} (ref >=60)
Globulin: 3 g/dL (calc) (ref 1.9–3.7)
Glucose, Bld: 87 mg/dL (ref 65–99)
Potassium: 4.5 mmol/L (ref 3.5–5.3)
Sodium: 141 mmol/L (ref 135–146)
Total Bilirubin: 0.2 mg/dL (ref 0.2–1.2)
Total Protein: 6.9 g/dL (ref 6.1–8.1)

## 2019-10-10 LAB — LIPID PANEL
Cholesterol: 201 mg/dL — ABNORMAL HIGH (ref <200)
HDL: 45 mg/dL — ABNORMAL LOW (ref >=50)
LDL Cholesterol (Calc): 136 mg/dL (calc) — ABNORMAL HIGH
Non-HDL Cholesterol (Calc): 156 mg/dL (calc) — ABNORMAL HIGH (ref <130)
Total CHOL/HDL Ratio: 4.5 (calc) (ref <5.0)
Triglycerides: 98 mg/dL (ref <150)

## 2019-10-10 LAB — TSH: TSH: 1.26 mIU/L (ref 0.40–4.50)

## 2019-10-16 ENCOUNTER — Encounter: Payer: BLUE CROSS/BLUE SHIELD | Admitting: Internal Medicine

## 2019-11-09 ENCOUNTER — Encounter: Payer: Self-pay | Admitting: Internal Medicine

## 2019-11-09 ENCOUNTER — Ambulatory Visit (INDEPENDENT_AMBULATORY_CARE_PROVIDER_SITE_OTHER): Payer: 59 | Admitting: Internal Medicine

## 2019-11-09 ENCOUNTER — Other Ambulatory Visit: Payer: Self-pay

## 2019-11-09 VITALS — BP 120/80 | HR 68 | Temp 98.0°F | Ht 61.0 in | Wt 128.0 lb

## 2019-11-09 DIAGNOSIS — R8290 Unspecified abnormal findings in urine: Secondary | ICD-10-CM | POA: Diagnosis not present

## 2019-11-09 DIAGNOSIS — N760 Acute vaginitis: Secondary | ICD-10-CM | POA: Diagnosis not present

## 2019-11-09 DIAGNOSIS — N898 Other specified noninflammatory disorders of vagina: Secondary | ICD-10-CM | POA: Diagnosis not present

## 2019-11-09 DIAGNOSIS — B9689 Other specified bacterial agents as the cause of diseases classified elsewhere: Secondary | ICD-10-CM

## 2019-11-09 DIAGNOSIS — F439 Reaction to severe stress, unspecified: Secondary | ICD-10-CM

## 2019-11-09 DIAGNOSIS — R350 Frequency of micturition: Secondary | ICD-10-CM

## 2019-11-09 DIAGNOSIS — Z8659 Personal history of other mental and behavioral disorders: Secondary | ICD-10-CM

## 2019-11-09 LAB — POCT WET PREP (WET MOUNT)

## 2019-11-09 LAB — POCT URINALYSIS DIPSTICK
Appearance: NEGATIVE
Bilirubin, UA: NEGATIVE
Blood, UA: NEGATIVE
Glucose, UA: NEGATIVE
Ketones, UA: NEGATIVE
Nitrite, UA: NEGATIVE
Odor: NEGATIVE
Protein, UA: NEGATIVE
Spec Grav, UA: 1.01 (ref 1.010–1.025)
Urobilinogen, UA: 0.2 E.U./dL
pH, UA: 6.5 (ref 5.0–8.0)

## 2019-11-09 MED ORDER — METRONIDAZOLE 500 MG PO TABS: 500.0000 mg | ORAL_TABLET | Freq: Two times a day (BID) | ORAL | 0 refills | Status: DC

## 2019-11-09 MED ORDER — VENLAFAXINE HCL ER 150 MG PO CP24: 150.0000 mg | ORAL_CAPSULE | Freq: Every day | ORAL | 1 refills | Status: DC

## 2019-11-09 NOTE — Progress Notes (Signed)
   Subjective:    Patient ID: [name] [name], female    DOB: 1961-05-04, 59 y.o.   MRN: [id number]  HPI 59 year old Female with genital irritation.  She is not sure if she had urinary infection or vaginitis.  Has noticed odor.  No fever chills or back pain.  Some frequency.    Review of Systems about a month ago tried Monistat one dose for itching  Some situational stress with daughter who is going through a divorce     Objective:   Physical Exam She is afebrile.  Genitalia: Normal female external genitalia.  She has milky discharge.  Wet prep consistent with clue cells.  Urinalysis is abnormal.  Trace LE present.  Urine culture revealed no growth.  Wet prep showed large number of white blood cells and a few bacteria.     Assessment & Plan:  Bacterial vaginosis  Situational stress with daughter who is going through a divorce.  This was discussed at length.  She has been on Effexor 75 mg XR daily.  Does not feel that it is working.  Increased to Effexor XR 150 mg daily.  Plan: Flagyl 500 mg twice daily for 7 days.  Use vinegar and water douche after finishing Flagyl.

## 2019-11-10 LAB — URINE CULTURE
MICRO NUMBER:: 10017946
Result:: NO GROWTH
SPECIMEN QUALITY:: ADEQUATE

## 2019-11-18 ENCOUNTER — Other Ambulatory Visit: Payer: Self-pay | Admitting: Internal Medicine

## 2019-11-26 NOTE — Patient Instructions (Addendum)
Take Flagyl 500 mg twice daily for 7 days.  Then use vinegar and water douche once.  Increase Effexor XR to 150 mg daily.

## 2019-12-01 ENCOUNTER — Other Ambulatory Visit: Payer: Self-pay | Admitting: Internal Medicine

## 2019-12-05 ENCOUNTER — Encounter: Payer: Self-pay | Admitting: Internal Medicine

## 2019-12-05 ENCOUNTER — Telehealth (INDEPENDENT_AMBULATORY_CARE_PROVIDER_SITE_OTHER): Payer: 59 | Admitting: Internal Medicine

## 2019-12-05 DIAGNOSIS — R072 Precordial pain: Secondary | ICD-10-CM

## 2019-12-05 NOTE — Telephone Encounter (Signed)
Patient called at 4:45pm complaining of substernal chest pain. No diaphoresis, nausea or vomiting. Ate spring rolls and white rice earlier. usually does not have heart burn. No waterbrash. Some recent physical activity but says no chest wall pain although I tried to get her to check ribs and sternum. No vomiting.She took 2 TUMS without relief. Spoke with patient personally by phone. Identified using 2 identifiers as [name] [name], a patient in this practice.  Office is closing for the day. Advised ED visit but patient does not want to go to ED. Advised Nexium 20 mg OTC- take 2 capsules now. Sounds anxious. If pain persists, need to go to ED.

## 2020-01-23 ENCOUNTER — Telehealth: Payer: Self-pay | Admitting: Internal Medicine

## 2020-01-23 NOTE — Telephone Encounter (Signed)
[name] [name] [phone]  [name] called to say she is having pain in both hips that runs down into her legs. In the mornings it is worse and she has shooting pains that run down, No Fever, No Covid exposure. Would like to come in today or Friday to be seen.

## 2020-01-23 NOTE — Telephone Encounter (Signed)
Suggest orthopedist instead

## 2020-01-23 NOTE — Telephone Encounter (Signed)
Gave patient the phone numbers for ortho

## 2020-02-09 ENCOUNTER — Other Ambulatory Visit: Payer: Self-pay

## 2020-02-12 ENCOUNTER — Other Ambulatory Visit: Payer: Self-pay

## 2020-02-12 ENCOUNTER — Other Ambulatory Visit: Payer: 59 | Admitting: Internal Medicine

## 2020-02-12 DIAGNOSIS — F419 Anxiety disorder, unspecified: Secondary | ICD-10-CM

## 2020-02-12 DIAGNOSIS — Z0000 Encounter for general adult medical examination without abnormal findings: Secondary | ICD-10-CM

## 2020-02-12 DIAGNOSIS — E7800 Pure hypercholesterolemia, unspecified: Secondary | ICD-10-CM

## 2020-02-12 DIAGNOSIS — Z8659 Personal history of other mental and behavioral disorders: Secondary | ICD-10-CM

## 2020-02-12 LAB — COMPLETE METABOLIC PANEL WITH GFR
AG Ratio: 1.3 (calc) (ref 1.0–2.5)
ALT: 13 U/L (ref 6–29)
AST: 16 U/L (ref 10–35)
Albumin: 4.1 g/dL (ref 3.6–5.1)
Alkaline phosphatase (APISO): 45 U/L (ref 37–153)
BUN: 15 mg/dL (ref 7–25)
CO2: 26 mmol/L (ref 20–32)
Calcium: 9.4 mg/dL (ref 8.6–10.4)
Chloride: 110 mmol/L (ref 98–110)
Creat: 0.85 mg/dL (ref 0.50–1.05)
GFR, Est African American: 88 mL/min/{1.73_m2} (ref >=60)
GFR, Est Non African American: 76 mL/min/{1.73_m2} (ref >=60)
Globulin: 3.2 g/dL (calc) (ref 1.9–3.7)
Glucose, Bld: 89 mg/dL (ref 65–99)
Potassium: 4.5 mmol/L (ref 3.5–5.3)
Sodium: 141 mmol/L (ref 135–146)
Total Bilirubin: 0.4 mg/dL (ref 0.2–1.2)
Total Protein: 7.3 g/dL (ref 6.1–8.1)

## 2020-02-12 LAB — LIPID PANEL
Cholesterol: 222 mg/dL — ABNORMAL HIGH (ref <200)
HDL: 47 mg/dL — ABNORMAL LOW (ref >=50)
LDL Cholesterol (Calc): 153 mg/dL (calc) — ABNORMAL HIGH
Non-HDL Cholesterol (Calc): 175 mg/dL (calc) — ABNORMAL HIGH (ref <130)
Total CHOL/HDL Ratio: 4.7 (calc) (ref <5.0)
Triglycerides: 102 mg/dL (ref <150)

## 2020-02-12 LAB — CBC WITH DIFFERENTIAL/PLATELET
Absolute Monocytes: 542 cells/uL (ref 200–950)
Basophils Absolute: 29 cells/uL (ref 0–200)
Basophils Relative: 0.6 %
Eosinophils Absolute: 91 cells/uL (ref 15–500)
Eosinophils Relative: 1.9 %
HCT: 40.4 % (ref 35.0–45.0)
Hemoglobin: 13.5 g/dL (ref 11.7–15.5)
Lymphs Abs: 1368 cells/uL (ref 850–3900)
MCH: 31.5 pg (ref 27.0–33.0)
MCHC: 33.4 g/dL (ref 32.0–36.0)
MCV: 94.2 fL (ref 80.0–100.0)
MPV: 10.7 fL (ref 7.5–12.5)
Monocytes Relative: 11.3 %
Neutro Abs: 2770 cells/uL (ref 1500–7800)
Neutrophils Relative %: 57.7 %
Platelets: 201 10*3/uL (ref 140–400)
RBC: 4.29 10*6/uL (ref 3.80–5.10)
RDW: 12.8 % (ref 11.0–15.0)
Total Lymphocyte: 28.5 %
WBC: 4.8 10*3/uL (ref 3.8–10.8)

## 2020-02-12 LAB — VITAMIN D 25 HYDROXY (VIT D DEFICIENCY, FRACTURES): Vit D, 25-Hydroxy: 35 ng/mL (ref 30–100)

## 2020-02-12 LAB — TSH: TSH: 1.28 mIU/L (ref 0.40–4.50)

## 2020-02-19 ENCOUNTER — Ambulatory Visit (INDEPENDENT_AMBULATORY_CARE_PROVIDER_SITE_OTHER): Payer: 59 | Admitting: Internal Medicine

## 2020-02-19 ENCOUNTER — Other Ambulatory Visit: Payer: Self-pay

## 2020-02-19 ENCOUNTER — Other Ambulatory Visit (HOSPITAL_COMMUNITY)
Admission: RE | Admit: 2020-02-19 | Discharge: 2020-02-19 | Disposition: A | Discharge status: Home / Self Care | Payer: BLUE CROSS/BLUE SHIELD | Source: Ambulatory Visit | Attending: Internal Medicine | Admitting: Internal Medicine

## 2020-02-19 ENCOUNTER — Encounter: Payer: Self-pay | Admitting: Internal Medicine

## 2020-02-19 VITALS — BP 110/80 | HR 68 | Temp 98.1°F | Ht 61.0 in | Wt 135.0 lb

## 2020-02-19 DIAGNOSIS — Z7189 Other specified counseling: Secondary | ICD-10-CM | POA: Diagnosis not present

## 2020-02-19 DIAGNOSIS — E7800 Pure hypercholesterolemia, unspecified: Secondary | ICD-10-CM

## 2020-02-19 DIAGNOSIS — Z0000 Encounter for general adult medical examination without abnormal findings: Secondary | ICD-10-CM

## 2020-02-19 DIAGNOSIS — Z8669 Personal history of other diseases of the nervous system and sense organs: Secondary | ICD-10-CM

## 2020-02-19 DIAGNOSIS — Z8659 Personal history of other mental and behavioral disorders: Secondary | ICD-10-CM | POA: Diagnosis not present

## 2020-02-19 DIAGNOSIS — Z872 Personal history of diseases of the skin and subcutaneous tissue: Secondary | ICD-10-CM

## 2020-02-19 LAB — POCT URINALYSIS DIPSTICK
Appearance: NEGATIVE
Bilirubin, UA: NEGATIVE
Blood, UA: NEGATIVE
Glucose, UA: NEGATIVE
Ketones, UA: NEGATIVE
Leukocytes, UA: NEGATIVE
Nitrite, UA: NEGATIVE
Odor: NEGATIVE
Protein, UA: NEGATIVE
Spec Grav, UA: 1.01 (ref 1.010–1.025)
Urobilinogen, UA: 0.2 E.U./dL
pH, UA: 6.5 (ref 5.0–8.0)

## 2020-02-19 MED ORDER — ROSUVASTATIN CALCIUM 5 MG PO TABS: 5.0000 mg | ORAL_TABLET | Freq: Every day | ORAL | 3 refills | Status: DC

## 2020-02-19 MED ORDER — ESTRADIOL 0.1 MG/24HR TD PTTW: MEDICATED_PATCH | TRANSDERMAL | 3 refills | Status: DC

## 2020-02-19 NOTE — Patient Instructions (Signed)
Follow up in 3 months with lipid liver functions and OV. Start Crestor 5 mg 3 times a week with supper.
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
## 2020-03-01 ENCOUNTER — Other Ambulatory Visit: Payer: Self-pay | Admitting: Internal Medicine

## 2020-03-04 LAB — CYTOLOGY - PAP
Diagnosis: NEGATIVE
Diagnosis: REACTIVE

## 2020-05-20 ENCOUNTER — Other Ambulatory Visit: Payer: 59 | Admitting: Internal Medicine

## 2020-05-20 ENCOUNTER — Other Ambulatory Visit: Payer: Self-pay

## 2020-05-20 DIAGNOSIS — E7800 Pure hypercholesterolemia, unspecified: Secondary | ICD-10-CM

## 2020-05-20 LAB — LIPID PANEL
Cholesterol: 157 mg/dL (ref <200)
HDL: 46 mg/dL — ABNORMAL LOW (ref >=50)
LDL Cholesterol (Calc): 89 mg/dL (calc)
Non-HDL Cholesterol (Calc): 111 mg/dL (calc) (ref <130)
Total CHOL/HDL Ratio: 3.4 (calc) (ref <5.0)
Triglycerides: 128 mg/dL (ref <150)

## 2020-05-20 LAB — HEPATIC FUNCTION PANEL
AG Ratio: 1.5 (calc) (ref 1.0–2.5)
ALT: 11 U/L (ref 6–29)
AST: 14 U/L (ref 10–35)
Albumin: 4.1 g/dL (ref 3.6–5.1)
Alkaline phosphatase (APISO): 41 U/L (ref 37–153)
Bilirubin, Direct: 0.1 mg/dL (ref 0.0–0.2)
Globulin: 2.7 g/dL (calc) (ref 1.9–3.7)
Indirect Bilirubin: 0.3 mg/dL (calc) (ref 0.2–1.2)
Total Bilirubin: 0.4 mg/dL (ref 0.2–1.2)
Total Protein: 6.8 g/dL (ref 6.1–8.1)

## 2020-05-27 ENCOUNTER — Other Ambulatory Visit: Payer: Self-pay

## 2020-05-27 ENCOUNTER — Ambulatory Visit: Payer: 59 | Admitting: Internal Medicine

## 2020-05-27 VITALS — BP 140/80 | HR 88 | Ht 61.0 in | Wt 140.0 lb

## 2020-05-27 DIAGNOSIS — F4321 Adjustment disorder with depressed mood: Secondary | ICD-10-CM

## 2020-05-27 DIAGNOSIS — Z8659 Personal history of other mental and behavioral disorders: Secondary | ICD-10-CM | POA: Diagnosis not present

## 2020-05-27 DIAGNOSIS — E7800 Pure hypercholesterolemia, unspecified: Secondary | ICD-10-CM

## 2020-05-27 MED ORDER — MELOXICAM 15 MG PO TABS: 15.0000 mg | ORAL_TABLET | Freq: Every day | ORAL | 1 refills | Status: DC

## 2020-05-27 NOTE — Progress Notes (Signed)
   Subjective:    Patient ID: [name] [name], female    DOB: [birth year], 59 y.o.   MRN: [id number]  HPI 59 year old Female seen today for follow up of hypserlipidemia. Is on Crestor 5 mg daily and lipid panel has normalized. She needs to keep taking Crestor. No side effects from Crestor.  More worrisome is that she is more depressed.  She is trying to help take care of her grandchildren.  Her son is hiking the [place] trail and will be back in a few weeks.  She has a history of bipolar disorder.  Effexor XR 150 mg daily is not controlling her depression symptoms.  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  Has musculoskeletal pain from heavy work with the cleaning business.  This is right-sided involving right hip radiating into her right leg.  Does not have weakness just  pain.  Have prescribed meloxicam 15 mg daily for musculoskeletal pain.  She is reluctant to see a psychiatrist.  She does need a med consult with one as soon as possible.  Her depression score is 20.  Have suggested that she go to the new behavioral health center and be seen urgently for evaluation.  We talked about some of the reasons she is depressed and they seem to center around family issues.  She tries to take care of everybody and becomes overextended.  She is able to ambulate although a bit slowly.  She has normal muscle strength in the right lower extremity and straight leg raising is slightly positive at 90 degrees.  Review of Systems see above     Objective:   Physical Exam Blood pressure 140/80 but patient is agitated and relating family issues to me today.  BMI 26.45 pulse 88 regular pulse oximetry 97% weight 140 pounds height 5 feet 1 inches spent 20 minutes speaking with her about family issues that are causing her stress, anxiety and depression.  These involve grandchildren who are staying in her condominium.       Assessment & Plan:   Right lower back and leg  pain-? Sciatica Plan: Meloxicam 15 mg daily with a meal  Depression with history of bipolar disorder-not controlled with Effexor Exar 150 mg daily.  Needs evaluation by psychiatrist.  Hyperlipidemia stable with Crestor 5 mg daily.  Should continue this indefinitely.  Plan: Referred to [facility]al health Crisis center for med management. Phone number and location provided. Needs counseling and med consult.  For right sciatica prescribed meloxicam 15 mg daily with a meal.  Records show she has not had a health maintenance exam since 2019.  This needs to be scheduled in 3 to 6 months.

## 2020-05-27 NOTE — Progress Notes (Deleted)
   Subjective:    Patient ID: [name] [name], female    DOB: 1961/08/05, 59 y.o.   MRN: [id number]  HPI    Review of Systems     Objective:   Physical Exam        Assessment & Plan:

## 2020-05-28 ENCOUNTER — Encounter: Payer: Self-pay | Admitting: Internal Medicine

## 2020-05-28 NOTE — Patient Instructions (Signed)
Patient referred to behavioral [place] crisis center for med consult and counseling.  Effexor alone is not working to treat her depression.  Continue Crestor 5 mg daily.  She needs health maintenance exam in 3 to 6 months.  Given meloxicam 15 mg daily for right buttock and leg pain consistent with sciatica.

## 2020-06-03 ENCOUNTER — Other Ambulatory Visit: Payer: Self-pay

## 2020-06-03 ENCOUNTER — Inpatient Hospital Stay (HOSPITAL_COMMUNITY)
Admission: AD | Admit: 2020-06-03 | Discharge: 2020-06-06 | DRG: 885 | Disposition: A | Discharge status: Home / Self Care | Payer: 59 | Source: Intra-hospital | Attending: Psychiatry | Admitting: Psychiatry

## 2020-06-03 ENCOUNTER — Ambulatory Visit (HOSPITAL_COMMUNITY)
Admission: EM | Admit: 2020-06-03 | Discharge: 2020-06-03 | Disposition: A | Discharge status: Other Acute Inpatient Hospital | Payer: No Payment, Other | Attending: Registered Nurse | Admitting: Registered Nurse

## 2020-06-03 ENCOUNTER — Other Ambulatory Visit: Payer: Self-pay | Admitting: Registered Nurse

## 2020-06-03 ENCOUNTER — Encounter (HOSPITAL_COMMUNITY): Payer: Self-pay | Admitting: Registered Nurse

## 2020-06-03 DIAGNOSIS — Z20822 Contact with and (suspected) exposure to covid-19: Secondary | ICD-10-CM | POA: Insufficient documentation

## 2020-06-03 DIAGNOSIS — R45851 Suicidal ideations: Secondary | ICD-10-CM | POA: Diagnosis present

## 2020-06-03 DIAGNOSIS — Z62810 Personal history of physical and sexual abuse in childhood: Secondary | ICD-10-CM | POA: Diagnosis present

## 2020-06-03 DIAGNOSIS — F4310 Post-traumatic stress disorder, unspecified: Secondary | ICD-10-CM | POA: Diagnosis not present

## 2020-06-03 DIAGNOSIS — F319 Bipolar disorder, unspecified: Secondary | ICD-10-CM | POA: Diagnosis present

## 2020-06-03 DIAGNOSIS — F339 Major depressive disorder, recurrent, unspecified: Secondary | ICD-10-CM | POA: Diagnosis present

## 2020-06-03 DIAGNOSIS — R454 Irritability and anger: Secondary | ICD-10-CM | POA: Insufficient documentation

## 2020-06-03 DIAGNOSIS — Z8249 Family history of ischemic heart disease and other diseases of the circulatory system: Secondary | ICD-10-CM

## 2020-06-03 DIAGNOSIS — Z818 Family history of other mental and behavioral disorders: Secondary | ICD-10-CM

## 2020-06-03 DIAGNOSIS — Z915 Personal history of self-harm: Secondary | ICD-10-CM

## 2020-06-03 DIAGNOSIS — F411 Generalized anxiety disorder: Secondary | ICD-10-CM | POA: Diagnosis present

## 2020-06-03 DIAGNOSIS — Z981 Arthrodesis status: Secondary | ICD-10-CM

## 2020-06-03 DIAGNOSIS — G43909 Migraine, unspecified, not intractable, without status migrainosus: Secondary | ICD-10-CM | POA: Diagnosis present

## 2020-06-03 DIAGNOSIS — F419 Anxiety disorder, unspecified: Secondary | ICD-10-CM | POA: Insufficient documentation

## 2020-06-03 DIAGNOSIS — Z87891 Personal history of nicotine dependence: Secondary | ICD-10-CM | POA: Insufficient documentation

## 2020-06-03 DIAGNOSIS — F332 Major depressive disorder, recurrent severe without psychotic features: Secondary | ICD-10-CM

## 2020-06-03 DIAGNOSIS — Z814 Family history of other substance abuse and dependence: Secondary | ICD-10-CM

## 2020-06-03 DIAGNOSIS — R079 Chest pain, unspecified: Secondary | ICD-10-CM | POA: Insufficient documentation

## 2020-06-03 DIAGNOSIS — Z91410 Personal history of adult physical and sexual abuse: Secondary | ICD-10-CM

## 2020-06-03 LAB — CBC WITH DIFFERENTIAL/PLATELET
Abs Immature Granulocytes: 0.02 10*3/uL (ref 0.00–0.07)
Basophils Absolute: 0 10*3/uL (ref 0.0–0.1)
Basophils Relative: 1 %
Eosinophils Absolute: 0.1 10*3/uL (ref 0.0–0.5)
Eosinophils Relative: 2 %
HCT: 44.7 % (ref 36.0–46.0)
Hemoglobin: 14.5 g/dL (ref 12.0–15.0)
Immature Granulocytes: 0 %
Lymphocytes Relative: 30 %
Lymphs Abs: 1.9 10*3/uL (ref 0.7–4.0)
MCH: 30.5 pg (ref 26.0–34.0)
MCHC: 32.4 g/dL (ref 30.0–36.0)
MCV: 94.1 fL (ref 80.0–100.0)
Monocytes Absolute: 0.6 10*3/uL (ref 0.1–1.0)
Monocytes Relative: 10 %
Neutro Abs: 3.6 10*3/uL (ref 1.7–7.7)
Neutrophils Relative %: 57 %
Platelets: 210 10*3/uL (ref 150–400)
RBC: 4.75 MIL/uL (ref 3.87–5.11)
RDW: 13.2 % (ref 11.5–15.5)
WBC: 6.3 10*3/uL (ref 4.0–10.5)
nRBC: 0 % (ref 0.0–0.2)

## 2020-06-03 LAB — COMPREHENSIVE METABOLIC PANEL
ALT: 17 U/L (ref 0–44)
AST: 19 U/L (ref 15–41)
Albumin: 4.5 g/dL (ref 3.5–5.0)
Alkaline Phosphatase: 45 U/L (ref 38–126)
Anion gap: 8 (ref 5–15)
BUN: 10 mg/dL (ref 6–20)
CO2: 23 mmol/L (ref 22–32)
Calcium: 9.5 mg/dL (ref 8.9–10.3)
Chloride: 107 mmol/L (ref 98–111)
Creatinine, Ser: 0.75 mg/dL (ref 0.44–1.00)
GFR calc Af Amer: >60 mL/min (ref >60)
GFR calc non Af Amer: >60 mL/min (ref >60)
Glucose, Bld: 89 mg/dL (ref 70–99)
Potassium: 3.7 mmol/L (ref 3.5–5.1)
Sodium: 138 mmol/L (ref 135–145)
Total Bilirubin: 0.6 mg/dL (ref 0.3–1.2)
Total Protein: 7.9 g/dL (ref 6.5–8.1)

## 2020-06-03 LAB — ETHANOL: Alcohol, Ethyl (B): <10 mg/dL (ref <10)

## 2020-06-03 LAB — LIPID PANEL
Cholesterol: 189 mg/dL (ref 0–200)
HDL: 52 mg/dL (ref >40)
LDL Cholesterol: 116 mg/dL — ABNORMAL HIGH (ref 0–99)
Total CHOL/HDL Ratio: 3.6 RATIO
Triglycerides: 106 mg/dL (ref <150)
VLDL: 21 mg/dL (ref 0–40)

## 2020-06-03 LAB — POCT URINE DRUG SCREEN - MANUAL ENTRY (I-SCREEN)
POC Amphetamine UR: NOT DETECTED
POC Buprenorphine (BUP): NOT DETECTED
POC Cocaine UR: NOT DETECTED
POC Marijuana UR: NOT DETECTED
POC Methadone UR: NOT DETECTED
POC Methamphetamine UR: NOT DETECTED
POC Morphine: NOT DETECTED
POC Oxazepam (BZO): NOT DETECTED
POC Oxycodone UR: NOT DETECTED
POC Secobarbital (BAR): NOT DETECTED

## 2020-06-03 LAB — URINALYSIS, ROUTINE W REFLEX MICROSCOPIC
Bilirubin Urine: NEGATIVE
Glucose, UA: NEGATIVE mg/dL
Hgb urine dipstick: NEGATIVE
Ketones, ur: NEGATIVE mg/dL
Leukocytes,Ua: NEGATIVE
Nitrite: NEGATIVE
Protein, ur: NEGATIVE mg/dL
Specific Gravity, Urine: 1.005 (ref 1.005–1.030)
pH: 7 (ref 5.0–8.0)

## 2020-06-03 LAB — TSH: TSH: 1.072 u[IU]/mL (ref 0.350–4.500)

## 2020-06-03 LAB — SARS CORONAVIRUS 2 BY RT PCR (HOSPITAL ORDER, PERFORMED IN ~~LOC~~ HOSPITAL LAB): SARS Coronavirus 2: NEGATIVE

## 2020-06-03 LAB — POC SARS CORONAVIRUS 2 AG -  ED: SARS Coronavirus 2 Ag: NEGATIVE

## 2020-06-03 LAB — POC SARS CORONAVIRUS 2 AG: SARS Coronavirus 2 Ag: NEGATIVE

## 2020-06-03 MED ORDER — TRAZODONE HCL 50 MG PO TABS
50.0000 mg | ORAL_TABLET | Freq: Every evening | ORAL | Status: DC | PRN
  Administered 2020-06-03: 50 mg via ORAL
  Filled 2020-06-03: qty 1

## 2020-06-03 MED ORDER — HYDROXYZINE HCL 25 MG PO TABS
25.0000 mg | ORAL_TABLET | Freq: Three times a day (TID) | ORAL | Status: DC | PRN
  Administered 2020-06-03 – 2020-06-04 (×3): 25 mg via ORAL
  Filled 2020-06-03 (×3): qty 1

## 2020-06-03 NOTE — ED Provider Notes (Signed)
Behavioral Health Admission H&P [facility] & OBS)  Date: 06/03/20 Patient Name: [name] [name] MRN: [id number] Chief Complaint: No chief complaint on file.     Diagnoses:  Final diagnoses:  PTSD (post-traumatic stress disorder)  Severe episode of recurrent major depressive disorder, without psychotic features ([place])    HPI: [name] [name], 59 y.o., female patient presents to [facility] as a walk in with complaints of worsening depression, suicidal ideation with plan to overdose.  Patient reports prior suicide attempt 2003, prior psychiatric hospitalization x2.  Patient states that he main stressor is her children.  States she has no support other than a best friend who lives in [place] but they talk daily on phone.  Patient does not have any outpatients psychiatric services and psychotropics are managed by her primary doctor.  Recent changes in medications that were not working and adverse reaction.  Patient is unable to contract for safety.   During evaluation Roshawna Colclasure is alert/oriented x 4; calm/cooperative; and mood depressed and tearful; mood is congruent with affect.  She does not appear to be responding to internal/external stimuli or delusional thoughts.  Patient denies suicidal/self-harm/homicidal ideation, psychosis, and paranoia.  Patient answered question appropriately.    PHQ 2-9:    Office Visit from 05/27/2020 in [facility], [facility] Office Visit from 05/08/2019 in [facility], [facility]  Thoughts that you would be better off dead, or of hurting yourself in some way More than half the days Not at all  PHQ-9 Total Score 26 2        Total Time spent with patient: 30 minutes  Musculoskeletal  Strength & Muscle Tone: within normal limits Gait & Station: normal Patient leans: N/A  Psychiatric Specialty Exam  Presentation General Appearance: Appropriate for Environment;Neat  Eye Contact:Good  Speech:Clear and Coherent;Normal Rate  Speech  Volume:Normal  Handedness:Right   Mood and Affect  Mood:Depressed;Anxious  Affect:Depressed;Tearful   Thought Process  Thought Processes:Coherent;Goal Directed  Descriptions of Associations:Intact  Orientation:Full (Time, Place and Person)  Thought Content:WDL  Hallucinations:Hallucinations: None  Ideas of Reference:None  Suicidal Thoughts:Suicidal Thoughts: Yes, Active SI Active Intent and/or Plan: With Intent;With Plan (overdose)  Homicidal Thoughts:Homicidal Thoughts: No   Sensorium  Memory:Immediate Good;Recent Good;Remote Good  Judgment:Fair  Insight:Fair;Lacking   Executive Functions  Concentration:Good  Attention Span:Good  [place] of Knowledge:Good  Language:Good   Psychomotor Activity  Psychomotor Activity:Psychomotor Activity: Normal   Assets  Assets:Communication Skills;Desire for Improvement;Housing;Social Support;Transportation   Sleep  Sleep:Sleep: Fair   Physical Exam ROS  Blood pressure 134/83, temperature 98 F (36.7 C), temperature source Tympanic, resp. rate 16, height 5\' 1"  (1.549 m), weight 137 lb (62.1 kg), last menstrual period 11/02/2000, SpO2 100 %. Body mass index is 25.89 kg/m.  Past Psychiatric History: Major depressive disorder   Is the patient at risk to self? Yes  Has the patient been a risk to self in the past 6 months? No .    Has the patient been a risk to self within the distant past? Yes   Is the patient a risk to others? No   Has the patient been a risk to others in the past 6 months? No   Has the patient been a risk to others within the distant past? No   Past Medical History:  Past Medical History:  Diagnosis Date  . Anxiety   . Arthritis   . Helicobacter pylori (H. pylori) 07/2010  . Migraine     Past  Surgical History:  Procedure Laterality Date  . CATARACT EXTRACTION     bilateral  . CERVICAL FUSION    . TUBAL LIGATION    . UPPER GASTROINTESTINAL ENDOSCOPY    . VAGINAL  HYSTERECTOMY  2001   USO by history-sono2011 question of both ovaries present    Family History:  Family History  Adopted: Yes  Problem Relation Age of Onset  . Hypertension Mother   . Mental illness Mother   . Hypertension Brother     Social History:  Social History   Socioeconomic History  . Marital status: Divorced    Spouse name: Not on file  . Number of children: 1  . Years of education: 63  . Highest education level: Not on file  Occupational History  . Not on file  Tobacco Use  . Smoking status: Former Smoker    Types: Cigarettes  . Smokeless tobacco: Never Used  Vaping Use  . Vaping Use: Never used  Substance and Sexual Activity  . Alcohol use: Yes    Alcohol/week: 0.0 standard drinks    Comment: Very rare  . Drug use: No  . Sexual activity: Yes    Birth control/protection: Post-menopausal, Surgical    Comment: 1st intercourse 59 yo-More than 5 partners  Other Topics Concern  . Not on file  Social History Narrative   Pt went into the foster system at the age of 5. She as in 4 really bad foster homes. She was raised in [place]. Pt has one brother. Pt graduated HS. Pt lives in [place] and has one son and 2 daughters. Divorced x3. 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    Social Determinants of Health   Financial Resource Strain:   . Difficulty of Paying Living Expenses:   Food Insecurity:   . Worried About Charity fundraiser in the Last Year:   . Arboriculturist in the Last Year:   Transportation Needs:   . Film/video editor (Medical):   Marland Kitchen Lack of Transportation (Non-Medical):   Physical Activity:   . Days of Exercise per Week:   . Minutes of Exercise per Session:   Stress:   . Feeling of Stress :   Social Connections:   . Frequency of Communication with Friends and Family:   . Frequency of Social Gatherings with Friends and Family:   . Attends Religious Services:   . Active Member of Clubs or Organizations:   . Attends  Archivist Meetings:   Marland Kitchen Marital Status:   Intimate Partner Violence:   . Fear of Current or Ex-Partner:   . Emotionally Abused:   Marland Kitchen Physically Abused:   . Sexually Abused:     SDOH:  SDOH Screenings   Alcohol Screen:   . Last Alcohol Screening Score (AUDIT):   Depression (PHQ2-9): Medium Risk  . PHQ-2 Score: 26  Financial Resource Strain:   . Difficulty of Paying Living Expenses:   Food Insecurity:   . Worried About Charity fundraiser in the Last Year:   . [place] in the Last Year:   Housing:   . Last Housing Risk Score:   Physical Activity:   . Days of Exercise per Week:   . Minutes of Exercise per Session:   Social Connections:   . Frequency of Communication with Friends and Family:   . Frequency of Social Gatherings with Friends and Family:   . Attends Religious Services:   .  Active Member of Clubs or Organizations:   . Attends Archivist Meetings:   Marland Kitchen Marital Status:   Stress:   . Feeling of Stress :   Tobacco Use: Medium Risk  . Smoking Tobacco Use: Former Smoker  . Smokeless Tobacco Use: Never Used  Transportation Needs:   . Film/video editor (Medical):   Marland Kitchen Lack of Transportation (Non-Medical):     Last Labs:  Lab on 05/20/2020  Component Date Value Ref Range Status  . Total Protein 05/20/2020 6.8  6.1 - 8.1 g/dL Final  . Albumin 05/20/2020 4.1  3.6 - 5.1 g/dL Final  . Globulin 05/20/2020 2.7  1.9 - 3.7 g/dL (calc) Final  . AG Ratio 05/20/2020 1.5  1.0 - 2.5 (calc) Final  . Total Bilirubin 05/20/2020 0.4  0.2 - 1.2 mg/dL Final  . Bilirubin, Direct 05/20/2020 0.1  0.0 - 0.2 mg/dL Final  . Indirect Bilirubin 05/20/2020 0.3  0.2 - 1.2 mg/dL (calc) Final  . Alkaline phosphatase (APISO) 05/20/2020 41  37 - 153 U/L Final  . AST 05/20/2020 14  10 - 35 U/L Final  . ALT 05/20/2020 11  6 - 29 U/L Final  . Cholesterol 05/20/2020 157  <200 mg/dL Final  . HDL 05/20/2020 46* > OR = 50 mg/dL Final  . Triglycerides 05/20/2020 128   <150 mg/dL Final  . LDL Cholesterol (Calc) 05/20/2020 89  mg/dL (calc) Final   Comment: Reference range: <100 . Desirable range <100 mg/dL for primary prevention;   <70 mg/dL for patients with CHD or diabetic patients  with > or = 2 CHD risk factors. Marland Kitchen LDL-C is now calculated using the Martin-Hopkins  calculation, which is a validated novel method providing  better accuracy than the Friedewald equation in the  estimation of LDL-C.  Cresenciano Genre et al. Annamaria Helling. 7412;878(67): 2061-2068  (http://education.[facility].com/faq/FAQ164)   . Total CHOL/HDL Ratio 05/20/2020 3.4  <5.0 (calc) Final  . Non-HDL Cholesterol (Calc) 05/20/2020 111  <130 mg/dL (calc) Final   Comment: For patients with diabetes plus 1 major ASCVD risk  factor, treating to a non-HDL-C goal of <100 mg/dL  (LDL-C of <70 mg/dL) is considered a therapeutic  option.   Office Visit on 02/19/2020  Component Date Value Ref Range Status  . Color, UA 02/19/2020 YELLOW   Final  . Clarity, UA 02/19/2020 CLEAR   Final  . Glucose, UA 02/19/2020 Negative  Negative Final  . Bilirubin, UA 02/19/2020 NEG   Final  . Ketones, UA 02/19/2020 NEG   Final  . Spec Grav, UA 02/19/2020 1.010  1.010 - 1.025 Final  . Blood, UA 02/19/2020 NEG   Final  . pH, UA 02/19/2020 6.5  5.0 - 8.0 Final  . Protein, UA 02/19/2020 Negative  Negative Final  . Urobilinogen, UA 02/19/2020 0.2  0.2 or 1.0 E.U./dL Final  . Nitrite, UA 02/19/2020 NEG   Final  . Leukocytes, UA 02/19/2020 Negative  Negative Final  . Appearance 02/19/2020 NEG   Final  . Odor 02/19/2020 NEG   Final  . Adequacy 02/19/2020 Satisfactory for evaluation.   Final  . Diagnosis 02/19/2020 - Negative for Intraepithelial Lesions or Malignancy (NILM)   Final  . Diagnosis 02/19/2020 - Benign reactive/reparative changes   Final  . Comment 02/19/2020 Dr. Lyndon Code reviewed the case and agrees with the above diagnosis.   Final  Lab on 02/12/2020  Component Date Value Ref Range Status  . WBC  02/12/2020 4.8  3.8 - 10.8 Thousand/uL Final  . RBC 02/12/2020  4.29  3.80 - 5.10 Million/uL Final  . Hemoglobin 02/12/2020 13.5  11.7 - 15.5 g/dL Final  . HCT 02/12/2020 40.4  35 - 45 % Final  . MCV 02/12/2020 94.2  80.0 - 100.0 fL Final  . MCH 02/12/2020 31.5  27.0 - 33.0 pg Final  . MCHC 02/12/2020 33.4  32.0 - 36.0 g/dL Final  . RDW 02/12/2020 12.8  11.0 - 15.0 % Final  . Platelets 02/12/2020 201  140 - 400 Thousand/uL Final  . MPV 02/12/2020 10.7  7.5 - 12.5 fL Final  . Neutro Abs 02/12/2020 2,770  1,500 - 7,800 cells/uL Final  . Lymphs Abs 02/12/2020 1,368  850 - 3,900 cells/uL Final  . Absolute Monocytes 02/12/2020 542  200 - 950 cells/uL Final  . Eosinophils Absolute 02/12/2020 91  15 - 500 cells/uL Final  . Basophils Absolute 02/12/2020 29  0 - 200 cells/uL Final  . Neutrophils Relative % 02/12/2020 57.7  % Final  . Total Lymphocyte 02/12/2020 28.5  % Final  . Monocytes Relative 02/12/2020 11.3  % Final  . Eosinophils Relative 02/12/2020 1.9  % Final  . Basophils Relative 02/12/2020 0.6  % Final  . Glucose, Bld 02/12/2020 89  65 - 99 mg/dL Final   Comment: .            Fasting reference interval .   . BUN 02/12/2020 15  7 - 25 mg/dL Final  . Creat 02/12/2020 0.85  0.50 - 1.05 mg/dL Final   Comment: For patients >78 years of age, the reference limit for Creatinine is approximately 13% higher for people identified as African-American. .   . GFR, Est Non African American 02/12/2020 76  > OR = 60 mL/min/1.44m2 Final  . GFR, Est African American 02/12/2020 88  > OR = 60 mL/min/1.31m2 Final  . BUN/Creatinine Ratio 06/15/4817 NOT APPLICABLE  6 - 22 (calc) Final  . Sodium 02/12/2020 141  135 - 146 mmol/L Final  . Potassium 02/12/2020 4.5  3.5 - 5.3 mmol/L Final  . Chloride 02/12/2020 110  98 - 110 mmol/L Final  . CO2 02/12/2020 26  20 - 32 mmol/L Final  . Calcium 02/12/2020 9.4  8.6 - 10.4 mg/dL Final  . Total Protein 02/12/2020 7.3  6.1 - 8.1 g/dL Final  . Albumin 02/12/2020  4.1  3.6 - 5.1 g/dL Final  . Globulin 02/12/2020 3.2  1.9 - 3.7 g/dL (calc) Final  . AG Ratio 02/12/2020 1.3  1.0 - 2.5 (calc) Final  . Total Bilirubin 02/12/2020 0.4  0.2 - 1.2 mg/dL Final  . Alkaline phosphatase (APISO) 02/12/2020 45  37 - 153 U/L Final  . AST 02/12/2020 16  10 - 35 U/L Final  . ALT 02/12/2020 13  6 - 29 U/L Final  . Cholesterol 02/12/2020 222* <200 mg/dL Final  . HDL 02/12/2020 47* > OR = 50 mg/dL Final  . Triglycerides 02/12/2020 102  <150 mg/dL Final  . LDL Cholesterol (Calc) 02/12/2020 153* mg/dL (calc) Final   Comment: Reference range: <100 . Desirable range <100 mg/dL for primary prevention;   <70 mg/dL for patients with CHD or diabetic patients  with > or = 2 CHD risk factors. Marland Kitchen LDL-C is now calculated using the Martin-Hopkins  calculation, which is a validated novel method providing  better accuracy than the Friedewald equation in the  estimation of LDL-C.  Cresenciano Genre et al. Annamaria Helling. 5631;497(02): 2061-2068  (http://education.[facility].com/faq/FAQ164)   . Total CHOL/HDL Ratio 02/12/2020 4.7  <5.0 (calc) Final  .  Non-HDL Cholesterol (Calc) 02/12/2020 175* <130 mg/dL (calc) Final   Comment: For patients with diabetes plus 1 major ASCVD risk  factor, treating to a non-HDL-C goal of <100 mg/dL  (LDL-C of <70 mg/dL) is considered a therapeutic  option.   . TSH 02/12/2020 1.28  0.40 - 4.50 mIU/L Final  . Vit D, 25-Hydroxy 02/12/2020 35  30 - 100 ng/mL Final   Comment: Vitamin D Status         25-OH Vitamin D: . Deficiency:                    <20 ng/mL Insufficiency:             20 - 29 ng/mL Optimal:                 > or = 30 ng/mL . For 25-OH Vitamin D testing on patients on  D2-supplementation and patients for whom quantitation  of D2 and D3 fractions is required, the Quest[facility]ureD(TM) 25-OH VIT D, (D2,D3), LC/MS/MS is recommended: order  code [id number] (patients >60yrs). See Note 1 . Note 1 . For additional information, please refer to   http://education.[facility].com/faq/FAQ199  (This link is being provided for informational/ educational purposes only.)     Allergies: Patient has no known allergies.  PTA Medications: (Not in a hospital admission)   Medical Decision Making  Recommending inpatient psychiatric treatment.   Ordered CMP, CBC, Urinalysis, UDS, ETOH, UDS, Lipid panel, TSH, SARS    Recommendations  Based on my evaluation the patient does not appear to have an emergency medical condition.  Jaquawn Saffran, NP 06/03/20  1:31 PM
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
## 2020-06-03 NOTE — ED Provider Notes (Signed)
Patient has been accepted to [facility] after negative Covid results and after 5 pm; Bed 302/2

## 2020-06-03 NOTE — ED Notes (Signed)
Safe Transport Requested,  Marine scientist Form signed by Pt.  Pending transfer to [facility], [place] 302-2

## 2020-06-03 NOTE — ED Notes (Signed)
Pt belongings in locker #25.  

## 2020-06-03 NOTE — Tx Team (Signed)
Initial Treatment Plan 06/03/2020 10:38 PM [name] [name] [id number]    PATIENT STRESSORS: Medication change or noncompliance Family conflict  PATIENT STRENGTHS: Average or above average intelligence Capable of independent living General fund of knowledge Motivation for treatment/growth   PATIENT IDENTIFIED PROBLEMS: Major Depressive DIsorder  Suicidal ideation                   DISCHARGE CRITERIA:  Improved stabilization in mood, thinking, and/or behavior  PRELIMINARY DISCHARGE PLAN: Outpatient therapy  PATIENT/FAMILY INVOLVEMENT: This treatment plan has been presented to and reviewed with the patient, [name] [name], and/or family member, .  The patient and family have been given the opportunity to ask questions and make suggestions.  [name] Parkins, RN 06/03/2020, 10:38 PM

## 2020-06-04 DIAGNOSIS — F332 Major depressive disorder, recurrent severe without psychotic features: Secondary | ICD-10-CM | POA: Diagnosis not present

## 2020-06-04 MED ORDER — TOPIRAMATE 100 MG PO TABS
200.0000 mg | ORAL_TABLET | Freq: Two times a day (BID) | ORAL | Status: DC
  Administered 2020-06-04 – 2020-06-06 (×5): 200 mg via ORAL
  Filled 2020-06-04 (×8): qty 2

## 2020-06-04 MED ORDER — POLYETHYLENE GLYCOL 3350 17 G PO PACK
17.0000 g | PACK | Freq: Every day | ORAL | Status: DC | PRN
  Administered 2020-06-04 – 2020-06-05 (×2): 17 g via ORAL
  Filled 2020-06-04 (×2): qty 1

## 2020-06-04 MED ORDER — DULOXETINE HCL 30 MG PO CPEP
30.0000 mg | ORAL_CAPSULE | Freq: Every day | ORAL | Status: DC
  Administered 2020-06-04 – 2020-06-05 (×2): 30 mg via ORAL
  Filled 2020-06-04 (×3): qty 1

## 2020-06-04 MED ORDER — ROSUVASTATIN CALCIUM 5 MG PO TABS
5.0000 mg | ORAL_TABLET | Freq: Every day | ORAL | Status: DC
  Administered 2020-06-04 – 2020-06-06 (×3): 5 mg via ORAL
  Filled 2020-06-04 (×4): qty 1

## 2020-06-04 MED ORDER — TRAZODONE HCL 50 MG PO TABS
50.0000 mg | ORAL_TABLET | Freq: Every evening | ORAL | Status: DC | PRN
  Administered 2020-06-05: 50 mg via ORAL
  Filled 2020-06-04: qty 1
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
## 2020-06-04 NOTE — ED Provider Notes (Signed)
FBC/OBS ASAP Discharge Summary  Date and Time: 06/04/2020 1:40 AM  Name: [name] [name]  MRN:  [id number]   Discharge Diagnoses:  Final diagnoses:  PTSD (post-traumatic stress disorder)  Severe episode of recurrent major depressive disorder, without psychotic features ([place])    Subjective: [name] [name], 59 y.o., female patient presents to [facility] as a walk in with complaints of worsening depression, suicidal ideation with plan to overdose.  Patient reports prior suicide attempt 2003, prior psychiatric hospitalization x2.  Patient states that he main stressor is her children.  States she has no support other than a best friend who lives in [place] but they talk daily on phone.  Patient does not have any outpatients psychiatric services and psychotropics are managed by her primary doctor.  Recent changes in medications that were not working and adverse reaction.  Patient is unable to contract for safety.   During evaluation [name] [name] is alert/oriented x 4; calm/cooperative; and mood depressed and tearful; mood is congruent with affect.  She does not appear to be responding to internal/external stimuli or delusional thoughts.  Patient denies suicidal/self-harm/homicidal ideation, psychosis, and paranoia.  Patient answered question appropriately.    Stay Summary: Patient was admitted for continuous assessment pending inpatient bed placement at [facility]. Labs were collected. CBC, CMP, TSH are essentially normal. BAL<10, UDS negative. On evaluation, patient is alert and oriented x 4. Her speech is clear and coherent. She expresses irritability related to wait time to be transferred to [facility]. She states that she was told earlier in the day that she had been assigned a bed at [facility] she did not understand why it was taking so long. Acknowledged her concerns and agreed to contact [facility] for bed status. Patient continues to report suicidal ideations with thoughts of overdosing. She denies homicidal  ideations. She denies auditory and visual hallucinations. No indication that she is responding to internal stimuli  Total Time spent with patient: 15 minutes  Past Psychiatric History: MDD Past Medical History:  Past Medical History:  Diagnosis Date  . Anxiety   . Arthritis   . Helicobacter pylori (H. pylori) 07/2010  . Migraine     Past Surgical History:  Procedure Laterality Date  . CATARACT EXTRACTION     bilateral  . CERVICAL FUSION    . TUBAL LIGATION    . UPPER GASTROINTESTINAL ENDOSCOPY    . VAGINAL HYSTERECTOMY  2001   USO by history-sono2011 question of both ovaries present   Family History:  Family History  Adopted: Yes  Problem Relation Age of Onset  . Hypertension Mother   . Mental illness Mother   . Hypertension Brother     Social History:  Social History   Substance and Sexual Activity  Alcohol Use Yes  . Alcohol/week: 0.0 standard drinks   Comment: Very rare     Social History   Substance and Sexual Activity  Drug Use No    Social History   Socioeconomic History  . Marital status: Divorced    Spouse name: Not on file  . Number of children: 1  . Years of education: 40  . Highest education level: Not on file  Occupational History  . Not on file  Tobacco Use  . Smoking status: Former Smoker    Types: Cigarettes  . Smokeless tobacco: Never Used  Vaping Use  . Vaping Use: Never used  Substance and Sexual Activity  . Alcohol use: Yes    Alcohol/week: 0.0 standard drinks  Comment: Very rare  . Drug use: No  . Sexual activity: Yes    Birth control/protection: Post-menopausal, Surgical    Comment: 1st intercourse 59 yo-More than 5 partners  Other Topics Concern  . Not on file  Social History Narrative   Pt went into the foster system at the age of 68. She as in 4 really bad foster homes. She was raised in [place]. Pt has one brother. Pt graduated HS. Pt lives in [place] and has one son and 2 daughters. Divorced x3. 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    Social Determinants of Health   Financial Resource Strain:   . Difficulty of Paying Living Expenses:   Food Insecurity:   . Worried About Charity fundraiser in the Last Year:   . Arboriculturist in the Last Year:   Transportation Needs:   . Film/video editor (Medical):   Marland Kitchen Lack of Transportation (Non-Medical):   Physical Activity:   . Days of Exercise per Week:   . Minutes of Exercise per Session:   Stress:   . Feeling of Stress :   Social Connections:   . Frequency of Communication with Friends and Family:   . Frequency of Social Gatherings with Friends and Family:   . Attends Religious Services:   . Active Member of Clubs or Organizations:   . Attends Archivist Meetings:   Marland Kitchen Marital Status:    SDOH:  SDOH Screenings   Alcohol Screen: Low Risk   . Last Alcohol Screening Score (AUDIT): 1  Depression (PHQ2-9): Medium Risk  . PHQ-2 Score: 26  Financial Resource Strain:   . Difficulty of Paying Living Expenses:   Food Insecurity:   . Worried About Charity fundraiser in the Last Year:   . [place] in the Last Year:   Housing:   . Last Housing Risk Score:   Physical Activity:   . Days of Exercise per Week:   . Minutes of Exercise per Session:   Social Connections:   . Frequency of Communication with Friends and Family:   . Frequency of Social Gatherings with Friends and Family:   . Attends Religious Services:   . Active Member of Clubs or Organizations:   . Attends Archivist Meetings:   Marland Kitchen Marital Status:   Stress:   . Feeling of Stress :   Tobacco Use: Medium Risk  . Smoking Tobacco Use: Former Smoker  . Smokeless Tobacco Use: Never Used  Transportation Needs:   . Film/video editor (Medical):   Marland Kitchen Lack of Transportation (Non-Medical):     Has this patient used any form of tobacco in the last 30 days? (Cigarettes, Smokeless Tobacco, Cigars, and/or Pipes) Prescription not  provided because: patient transferred to another facility  Current Medications:  No current facility-administered medications for this encounter.   No current outpatient medications on file.   Facility-Administered Medications Ordered in Other Encounters  Medication Dose Route Frequency Provider Last Rate Last Admin  . hydrOXYzine (ATARAX/VISTARIL) tablet 25 mg  25 mg Oral TID PRN Lindon Romp A, NP   25 mg at 06/03/20 2230  . traZODone (DESYREL) tablet 50 mg  50 mg Oral QHS PRN,MR X 1 Lindon Romp A, NP   50 mg at 06/03/20 2230    PTA Medications: (Not in a hospital admission)   Musculoskeletal  Strength & Muscle Tone: within normal limits Gait & Station: normal Patient leans:  N/A  Psychiatric Specialty Exam  Presentation  General Appearance: Appropriate for Environment;Casual;Neat  Eye Contact:Fair  Speech:Clear and Coherent;Normal Rate  Speech Volume:Normal  Handedness:Right   Mood and Affect  Mood:Angry;Anxious;Depressed  Affect:Congruent;Depressed   Thought Process  Thought Processes:Coherent;Goal Directed  Descriptions of Associations:Intact  Orientation:Full (Time, Place and Person)  Thought Content:WDL  Hallucinations:Hallucinations: None  Ideas of Reference:None  Suicidal Thoughts:Suicidal Thoughts: Yes, Active SI Active Intent and/or Plan: With Intent;With Plan  Homicidal Thoughts:Homicidal Thoughts: No   Sensorium  Memory:Immediate Good;Recent Good  Judgment:Fair  Insight:Lacking   Executive Functions  Concentration:Good  Attention Span:Good  [place] of Knowledge:Good  Language:Good   Psychomotor Activity  Psychomotor Activity:Psychomotor Activity: Normal   Assets  Assets:Communication Skills;Desire for Improvement;Housing;Social Support;Transportation   Sleep  Sleep:Sleep: Fair   Physical Exam  Physical Exam Constitutional:      General: She is not in acute distress.    Appearance: She is not  ill-appearing, toxic-appearing or diaphoretic.  HENT:     Right Ear: External ear normal.     Left Ear: External ear normal.  Cardiovascular:     Rate and Rhythm: Normal rate.  Pulmonary:     Effort: Pulmonary effort is normal. No respiratory distress.  Musculoskeletal:        General: Normal range of motion.  Neurological:     Mental Status: She is alert and oriented to person, place, and time.  Psychiatric:        Mood and Affect: Mood is anxious and depressed.        Thought Content: Thought content is not paranoid or delusional. Thought content includes suicidal ideation. Thought content does not include homicidal ideation. Thought content includes suicidal plan.    Review of Systems  Constitutional: Negative for chills, diaphoresis, fever, malaise/fatigue and weight loss.  HENT: Negative for congestion.   Respiratory: Negative for cough and shortness of breath.   Cardiovascular: Negative for chest pain and palpitations.  Gastrointestinal: Negative for diarrhea, nausea and vomiting.  Neurological: Negative for dizziness.  Psychiatric/Behavioral: Positive for depression and suicidal ideas. Negative for hallucinations, memory loss and substance abuse. The patient is nervous/anxious and has insomnia.   All other systems reviewed and are negative.  Blood pressure 130/84, pulse 63, temperature 97.7 F (36.5 C), resp. rate 20, height 5\' 1"  (1.549 m), weight 137 lb (62.1 kg), last menstrual period 11/02/2000, SpO2 100 %. Body mass index is 25.89 kg/m.   Disposition: Patient transferred to [facility]. EMTALA updated. Admit orders placed by previous provider.   Rozetta Nunnery, NP 06/04/2020, 1:40 AM
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
## 2020-06-04 NOTE — Progress Notes (Signed)
Patient admitted to unit with passive SI, depression. Reports having excessive crying and a plan to overdose on medications. Pt currently endorses SI with no intent. Pt reports being tapered off of anti depressant and feels this is causing the crying spells and depression. Pt reports anhedonia, anxiety , hopelessness and feelings of worthlessness. Pt denies HI, AVH. Pt anxious during assessment. Pt got upset earlier in shift due to room being not as clean as it should have. Pt switched to a different room. Encouragement and support provided. Oriented pt to room and unit. Skin and contraband search completed and witnessed by Vaughan Basta, MHT. No contraband found, no skin issues noted. Pt allowed to retrieve phone numbers from cell. Pt given sleep medicine as requested with good relief. Pt receptive and remains safe on unit with q 15 min checks.

## 2020-06-04 NOTE — Progress Notes (Signed)
   06/04/20 2000  Psych Admission Type (Psych Patients Only)  Admission Status Voluntary  Psychosocial Assessment  Patient Complaints Anxiety  Eye Contact Fair  Facial Expression Anxious;Sad  Affect Anxious;Depressed;Sad  Speech Unremarkable  Interaction Assertive  Motor Activity Fidgety  Appearance/Hygiene Unremarkable;In scrubs  Thought Process  Coherency WDL  Content WDL  Delusions WDL  Perception WDL  Hallucination None reported or observed  Judgment Impaired  Confusion None  Danger to Self  Current suicidal ideation? Passive  Self-Injurious Behavior No self-injurious ideation or behavior indicators observed or expressed   Agreement Not to Harm Self Yes  Description of Agreement verbal contract for safety
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
## 2020-06-04 NOTE — Progress Notes (Signed)
Recreation Therapy Notes  Animal-Assisted Activity (AAA) Program Checklist/Progress Notes Patient Eligibility Criteria Checklist & Daily Group note for Rec Tx Intervention  Date: 8.3.21 Time: 3 Location: [address]   AAA/T Program Assumption of Risk Form signed by Teacher, music or Parent Legal Guardian  YES  Patient is free of allergies or sever asthma YES   Patient reports no fear of animals  YES   Patient reports no history of cruelty to animals YES   Patient understands his/her participation is voluntary YES  Patient washes hands before animal contact  YES  Patient washes hands after animal contact YES   Behavioral Response: Engaged  Education: Contractor, Appropriate Animal Interaction   Education Outcome: Acknowledges understanding/In group clarification offered/Needs additional education.   Clinical Observations/Feedback:  Pt attended and participated in activity.    Victorino Sparrow, LRT/CTRS         Victorino Sparrow A 06/04/2020 3:18 PM

## 2020-06-04 NOTE — Progress Notes (Signed)
   06/04/20 0607  Vital Signs  Pulse Rate 78  BP 106/83  BP Location Left Arm  BP Method Automatic  Patient Position (if appropriate) Standing   D: Patient denies SI/HI/AVH. Patient rates anxiety 8/10 and depression 10/10. Patient was given  25 mg of vistaril for anxiety. Patient complained of migraine headache and was given 200mg  of Topomax. A:  Patient took scheduled medicine.  Support and encouragement provided Routine safety checks conducted every 15 minutes. Patient  Informed to notify staff with any concerns.   R: Safety maintained.

## 2020-06-04 NOTE — Progress Notes (Signed)
Pt attend wrap up group.She  rate her day as a 8. Her goal for today was to go home. She did not achieve her goal. Coping skills she found helpful not to stress.

## 2020-06-05 DIAGNOSIS — F332 Major depressive disorder, recurrent severe without psychotic features: Secondary | ICD-10-CM | POA: Diagnosis not present

## 2020-06-05 MED ORDER — DULOXETINE HCL 20 MG PO CPEP
40.0000 mg | ORAL_CAPSULE | Freq: Every day | ORAL | Status: DC
  Administered 2020-06-06: 40 mg via ORAL
  Filled 2020-06-05 (×2): qty 2

## 2020-06-05 MED ORDER — BISACODYL 5 MG PO TBEC
5.0000 mg | DELAYED_RELEASE_TABLET | Freq: Every day | ORAL | Status: DC | PRN
  Administered 2020-06-05: 5 mg via ORAL
  Filled 2020-06-05: qty 1

## 2020-06-05 NOTE — Progress Notes (Signed)
[facility] Group Notes:  (Nursing/MHT/Case Management/Adjunct)  Date:  06/05/2020  Time:  2030  Type of Therapy:  wrap up group  Participation Level:  Active  Participation Quality:  Appropriate, Attentive, Sharing and Supportive  Affect:  Appropriate  Cognitive:  Appropriate  Insight:  Good  Engagement in Group:  Engaged  Modes of Intervention:  Clarification, Education and Support  Summary of Progress/Problems: Pt reports feeling better mentally and physically attributing change to medicine. Pt wants to do better at communicating her feelings and needs. She wants to not get in the middle of her daughters disagreements and reports her best friend as her number one support. Positive thinking and positive change were discussed.   Shellia Cleverly 06/05/2020, 9:13 PM

## 2020-06-05 NOTE — Progress Notes (Signed)
[facility] MD Progress Note  06/05/2020 8:02 AM [name] [name]  MRN:  [id number]   Subjective:  Patient states " I feel good, I slept good.", " I am happy". She adds " I gave myself some space from my daughters and my head is feeling good". She puts her mood on the scale of 1-10, 10 being really depressed on 1/10. She puts her anxiety on 5/10 ans states she is anxious as she wants to go home and staying here is causing her anxiety as she likes things clean. She states she has good appetite and ate her breakfast. She denies any suicidal ideation. She denies HI and AVH. She denies any side effects from medications. She states " Change of place and medications both has helped me to achieve this happy mood, I guess".  Objective: Patient is seen and examined. She is alert, oriented * 4, polite & respectful on approach. She looks anxious and talkative. She is fixated on discharge. She complains of stomach ache because she did not have a bowel movement. She does not seem like responding to the internal stimuli and she does not show any self injurious behavior on the unit. Her blood pressure 116/86, pulse 69, temperature 97.7 F (36.5 C), temperature source Oral, resp. rate 18. She slept for 6.5 hours. No new labs today.  Principal Problem: MDD (major depressive disorder), recurrent severe, without psychosis ([place]) Diagnosis: Principal Problem:   MDD (major depressive disorder), recurrent severe, without psychosis ([place]) Active Problems:   Anxiety   Bipolar disorder ([place])   GAD (generalized anxiety disorder)  Total Time spent with patient: 20 minutes  Past Psychiatric History: See H & P  Past Medical History:  Past Medical History:  Diagnosis Date  . Anxiety   . Arthritis   . Helicobacter pylori (H. pylori) 07/2010  . Migraine     Past Surgical History:  Procedure Laterality Date  . CATARACT EXTRACTION     bilateral  . CERVICAL FUSION    . TUBAL LIGATION    . UPPER GASTROINTESTINAL ENDOSCOPY     . VAGINAL HYSTERECTOMY  2001   USO by history-sono2011 question of both ovaries present   Family History:  Family History  Adopted: Yes  Problem Relation Age of Onset  . Hypertension Mother   . Mental illness Mother   . Hypertension Brother    Family Psychiatric  History: See H & P Social History:  Social History   Substance and Sexual Activity  Alcohol Use Yes  . Alcohol/week: 0.0 standard drinks   Comment: Very rare     Social History   Substance and Sexual Activity  Drug Use No    Social History   Socioeconomic History  . Marital status: Divorced    Spouse name: Not on file  . Number of children: 1  . Years of education: 1  . Highest education level: Not on file  Occupational History  . Not on file  Tobacco Use  . Smoking status: Former Smoker    Types: Cigarettes  . Smokeless tobacco: Never Used  Vaping Use  . Vaping Use: Never used  Substance and Sexual Activity  . Alcohol use: Yes    Alcohol/week: 0.0 standard drinks    Comment: Very rare  . Drug use: No  . Sexual activity: Yes    Birth control/protection: Post-menopausal, Surgical    Comment: 1st intercourse 59 yo-More than 5 partners  Other Topics Concern  . Not on file  Social History Narrative  Pt went into the foster system at the age of 67. She as in 4 really bad foster homes. She was raised in [place]. Pt has one brother. Pt graduated HS. Pt lives in [place] and has one son and 2 daughters. Divorced x3. 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    Social Determinants of Health   Financial Resource Strain:   . Difficulty of Paying Living Expenses:   Food Insecurity:   . Worried About Charity fundraiser in the Last Year:   . Arboriculturist in the Last Year:   Transportation Needs:   . Film/video editor (Medical):   Marland Kitchen Lack of Transportation (Non-Medical):   Physical Activity:   . Days of Exercise per Week:   . Minutes of Exercise per Session:   Stress:   .  Feeling of Stress :   Social Connections:   . Frequency of Communication with Friends and Family:   . Frequency of Social Gatherings with Friends and Family:   . Attends Religious Services:   . Active Member of Clubs or Organizations:   . Attends Archivist Meetings:   Marland Kitchen Marital Status:    Additional Social History:                         Sleep: Good  Appetite:  Good  Current Medications: Current Facility-Administered Medications  Medication Dose Route Frequency Provider Last Rate Last Admin  . DULoxetine (CYMBALTA) DR capsule 30 mg  30 mg Oral Daily Cobos, Myer Peer, MD   30 mg at 06/05/20 0740  . hydrOXYzine (ATARAX/VISTARIL) tablet 25 mg  25 mg Oral TID PRN Rozetta Nunnery, NP   25 mg at 06/04/20 2136  . polyethylene glycol (MIRALAX / GLYCOLAX) packet 17 g  17 g Oral Daily PRN Connye Burkitt, NP   17 g at 06/04/20 1410  . rosuvastatin (CRESTOR) tablet 5 mg  5 mg Oral Daily Cobos, Myer Peer, MD   5 mg at 06/05/20 0740  . topiramate (TOPAMAX) tablet 200 mg  200 mg Oral BID Connye Burkitt, NP   200 mg at 06/05/20 0740  . traZODone (DESYREL) tablet 50 mg  50 mg Oral QHS PRN Cobos, Myer Peer, MD        Lab Results:  Results for orders placed or performed during the hospital encounter of 06/03/20 (from the past 48 hour(s))  POC SARS Coronavirus 2 Ag-ED - Nasal Swab (BD Veritor Kit)     Status: Normal   Collection Time: 06/03/20  2:55 PM  Result Value Ref Range   SARS Coronavirus 2 Ag Negative Negative  SARS Coronavirus 2 by RT PCR (hospital order, performed in [facility] hospital lab) Nasopharyngeal Nasopharyngeal Swab     Status: None   Collection Time: 06/03/20  2:55 PM   Specimen: Nasopharyngeal Swab  Result Value Ref Range   SARS Coronavirus 2 NEGATIVE NEGATIVE    Comment: (NOTE) SARS-CoV-2 target nucleic acids are NOT DETECTED.  The SARS-CoV-2 RNA is generally detectable in upper and lower respiratory specimens during the acute phase of infection.  The lowest concentration of SARS-CoV-2 viral copies this assay can detect is 250 copies / mL. A negative result does not preclude SARS-CoV-2 infection and should not be used as the sole basis for treatment or other patient management decisions.  A negative result may occur with improper specimen collection / handling, submission of specimen other than nasopharyngeal  swab, presence of viral mutation(s) within the areas targeted by this assay, and inadequate number of viral copies (<250 copies / mL). A negative result must be combined with clinical observations, patient history, and epidemiological information.  Fact Sheet for Patients:   StrictlyIdeas.no  Fact Sheet for Healthcare Providers: BankingDealers.co.za  This test is not yet approved or  cleared by the [place] FDA and has been authorized for detection and/or diagnosis of SARS-CoV-2 by FDA under an Emergency Use Authorization (EUA).  This EUA will remain in effect (meaning this test can be used) for the duration of the COVID-19 declaration under Section 564(b)(1) of the Act, 21 U.S.C. section 360bbb-3(b)(1), unless the authorization is terminated or revoked sooner.  Performed at [facility], [place] [address] [place], [place] [address]   Urinalysis, Routine w reflex microscopic     Status: Abnormal   Collection Time: 06/03/20  2:55 PM  Result Value Ref Range   Color, Urine STRAW (A) YELLOW   APPearance CLEAR CLEAR   Specific Gravity, Urine 1.005 1.005 - 1.030   pH 7.0 5.0 - 8.0   Glucose, UA NEGATIVE NEGATIVE mg/dL   Hgb urine dipstick NEGATIVE NEGATIVE   Bilirubin Urine NEGATIVE NEGATIVE   Ketones, ur NEGATIVE NEGATIVE mg/dL   Protein, ur NEGATIVE NEGATIVE mg/dL   Nitrite NEGATIVE NEGATIVE   Leukocytes,Ua NEGATIVE NEGATIVE    Comment: Performed at [facility] [address] [place],  [address]  POCT Urine Drug Screen - (ICup)     Status: Normal    Collection Time: 06/03/20  2:55 PM  Result Value Ref Range   POC Amphetamine UR None Detected None Detected   POC Secobarbital (BAR) None Detected None Detected   POC Buprenorphine (BUP) None Detected None Detected   POC Oxazepam (BZO) None Detected None Detected   POC Cocaine UR None Detected None Detected   POC Methamphetamine UR None Detected None Detected   POC Morphine None Detected None Detected   POC Oxycodone UR None Detected None Detected   POC Methadone UR None Detected None Detected   POC Marijuana UR None Detected None Detected  POC SARS Coronavirus 2 Ag     Status: None   Collection Time: 06/03/20  3:00 PM  Result Value Ref Range   SARS Coronavirus 2 Ag NEGATIVE NEGATIVE    Comment: (NOTE) SARS-CoV-2 antigen NOT DETECTED.   Negative results are presumptive.  Negative results do not preclude SARS-CoV-2 infection and should not be used as the sole basis for treatment or other patient management decisions, including infection  control decisions, particularly in the presence of clinical signs and  symptoms consistent with COVID-19, or in those who have been in contact with the virus.  Negative results must be combined with clinical observations, patient history, and epidemiological information. The expected result is Negative.  Fact Sheet for Patients: PodPark.tn  Fact Sheet for Healthcare Providers: GiftContent.is   This test is not yet approved or cleared by the [place] FDA and  has been authorized for detection and/or diagnosis of SARS-CoV-2 by FDA under an Emergency Use Authorization (EUA).  This EUA will remain in effect (meaning this test can be used) for the duration of  the C OVID-19 declaration under Section 564(b)(1) of the Act, 21 U.S.C. section 360bbb-3(b)(1), unless the authorization is terminated or revoked sooner.    CBC with Differential/Platelet     Status: None   Collection Time:  06/03/20  4:42 PM  Result Value Ref Range  WBC 6.3 4.0 - 10.5 K/uL   RBC 4.75 3.87 - 5.11 MIL/uL   Hemoglobin 14.5 12.0 - 15.0 g/dL   HCT 44.7 36 - 46 %   MCV 94.1 80.0 - 100.0 fL   MCH 30.5 26.0 - 34.0 pg   MCHC 32.4 30.0 - 36.0 g/dL   RDW 13.2 11.5 - 15.5 %   Platelets 210 150 - 400 K/uL   nRBC 0.0 0.0 - 0.2 %   Neutrophils Relative % 57 %   Neutro Abs 3.6 1.7 - 7.7 K/uL   Lymphocytes Relative 30 %   Lymphs Abs 1.9 0.7 - 4.0 K/uL   Monocytes Relative 10 %   Monocytes Absolute 0.6 0 - 1 K/uL   Eosinophils Relative 2 %   Eosinophils Absolute 0.1 0 - 0 K/uL   Basophils Relative 1 %   Basophils Absolute 0.0 0 - 0 K/uL   Immature Granulocytes 0 %   Abs Immature Granulocytes 0.02 0.00 - 0.07 K/uL    Comment: Performed at [facility] [address], [place], [place] [address]  Comprehensive metabolic panel     Status: None   Collection Time: 06/03/20  4:42 PM  Result Value Ref Range   Sodium 138 135 - 145 mmol/L   Potassium 3.7 3.5 - 5.1 mmol/L   Chloride 107 98 - 111 mmol/L   CO2 23 22 - 32 mmol/L   Glucose, Bld 89 70 - 99 mg/dL    Comment: Glucose reference range applies only to samples taken after fasting for at least 8 hours.   BUN 10 6 - 20 mg/dL   Creatinine, Ser 0.75 0.44 - 1.00 mg/dL   Calcium 9.5 8.9 - 10.3 mg/dL   Total Protein 7.9 6.5 - 8.1 g/dL   Albumin 4.5 3.5 - 5.0 g/dL   AST 19 15 - 41 U/L   ALT 17 0 - 44 U/L   Alkaline Phosphatase 45 38 - 126 U/L   Total Bilirubin 0.6 0.3 - 1.2 mg/dL   GFR calc non Af Amer >60 >60 mL/min   GFR calc Af Amer >60 >60 mL/min   Anion gap 8 5 - 15    Comment: Performed at [facility], [place] [address] [place], [place] [address]  Lipid panel     Status: Abnormal   Collection Time: 06/03/20  4:42 PM  Result Value Ref Range   Cholesterol 189 0 - 200 mg/dL   Triglycerides 106 <150 mg/dL   HDL 52 >40 mg/dL   Total CHOL/HDL Ratio 3.6 RATIO   VLDL 21 0 - 40 mg/dL   LDL Cholesterol 116 (H) 0 - 99 mg/dL    Comment:         Total Cholesterol/HDL:CHD Risk Coronary Heart Disease Risk Table                     Men   Women  1/2 Average Risk   3.4   3.3  Average Risk       5.0   4.4  2 X Average Risk   9.6   7.1  3 X Average Risk  23.4   11.0        Use the calculated Patient Ratio above and the CHD Risk Table to determine the patient's CHD Risk.        ATP III CLASSIFICATION (LDL):  <100     mg/dL   Optimal  100-129  mg/dL   Near or Above  Optimal  130-159  mg/dL   Borderline  160-189  mg/dL   High  >190     mg/dL   Very High Performed at [facility] [address] [place], [place] [address]   TSH     Status: None   Collection Time: 06/03/20  4:43 PM  Result Value Ref Range   TSH 1.072 0.350 - 4.500 uIU/mL    Comment: Performed by a 3rd Generation assay with a functional sensitivity of <=0.01 uIU/mL. Performed at [facility], [place] [address] [place], [place] [address]   Ethanol     Status: None   Collection Time: 06/03/20  4:43 PM  Result Value Ref Range   Alcohol, Ethyl (B) <10 <10 mg/dL    Comment: (NOTE) Lowest detectable limit for serum alcohol is 10 mg/dL.  For medical purposes only. Performed at [facility], [place] [address], [place], [place] [address]     Blood Alcohol level:  Lab Results  Component Value Date   [facility] <10 06/03/2020   ETH <10 76/19/5093    Metabolic Disorder Labs: Lab Results  Component Value Date   HGBA1C 5.3 02/05/2014   MPG 105 02/05/2014   No results found for: PROLACTIN Lab Results  Component Value Date   CHOL 189 06/03/2020   TRIG 106 06/03/2020   HDL 52 06/03/2020   CHOLHDL 3.6 06/03/2020   VLDL 21 06/03/2020   LDLCALC 116 (H) 06/03/2020   LDLCALC 89 05/20/2020    Physical Findings: AIMS: Facial and Oral Movements Muscles of Facial Expression: None, normal Lips and Perioral Area: None, normal Jaw: None, normal Tongue: None, normal,Extremity Movements Upper (arms, wrists, hands, fingers): None,  normal Lower (legs, knees, ankles, toes): None, normal, Trunk Movements Neck, shoulders, hips: None, normal, Overall Severity Severity of abnormal movements (highest score from questions above): None, normal Incapacitation due to abnormal movements: None, normal Patient's awareness of abnormal movements (rate only patient's report): No Awareness, Dental Status Current problems with teeth and/or dentures?: No Does patient usually wear dentures?: No  CIWA:    COWS:     Musculoskeletal: Strength & Muscle Tone: within normal limits Gait & Station: normal Patient leans: N/A  Psychiatric Specialty Exam: Physical Exam Constitutional:      Appearance: Normal appearance.  Neurological:     Mental Status: She is alert and oriented to person, place, and time.  Psychiatric:        Behavior: Behavior normal.        Thought Content: Thought content normal.     Review of Systems  Blood pressure 116/86, pulse 69, temperature 97.7 F (36.5 C), temperature source Oral, resp. rate 18, height _0  (1.549 m), weight 60.3 kg, last menstrual period 11/02/2000, SpO2 100 %.Body mass index is 25.13 kg/m.  General Appearance: Fairly Groomed  Eye Contact:  Good  Speech:  Normal Rate  Volume:  Normal  Mood:  Euphoric  Affect:  Appropriate  Thought Process:  Coherent  Orientation:  Full (Time, Place, and Person)  Thought Content:  No hallucinations, no delusions  Suicidal Thoughts:  No  Homicidal Thoughts:  No  Memory:  Immediate;   Fair Recent;   Fair  Judgement:  Fair  Insight:  Good  Psychomotor Activity:  Normal  Concentration:  Concentration: Good and Attention Span: Good  Recall:  [place] of Knowledge:  Good  Language:  Good  Akathisia:  No  Handed:  Right  AIMS (if indicated):  Assets:  Communication Skills Desire for Improvement Resilience Social Support  ADL's:  Intact  Cognition:  WNL  Sleep:  Number of Hours: 6.5   Assessment: She presented to [facility] voluntarily  accompanied by her daughter stating " I have been feeling over-stressed, I felt I was going through more than I could handle".  D/D;  1. Major depressive disorder: Patient presents with neuro-vegetative symptoms of depression- poor sleep, low energy level, anhedonia, frequent crying episodes, suicidal ideation, worthlessness, hopelessness. Anhedonia.  2. Depressive episode of Bipolar disorder: Patient presents with neuro-vegetative symptoms of depression- poor sleep, low energy level, anhedonia, frequent crying episodes, suicidal ideation, worthlessness, hopelessness, anhedonia. Patient has a past history of Bipolar depression and explains some manic episodes lasting 1-2 days over years starting her adulthood.  3. Substance induced mood disorder- which is unlikely in this patient as her urine tested negative for all substances and she drinks a cooler or two with her friend/month. Today she looks in good mood, euthymic, anxious and fixated on her discharge.  Treatment Plan Summary: Daily contact with patient to assess and evaluate symptoms and progress in treatment.  Plan:  1. Continue Cymbalta 30 mg Qday for depressed mood. 2. Continue Trazodone 50 mg QHS PRN for insomnia. 3. Continue Hydroxyzine 25 mg PO TID PRN for anxiety. 4. Start dulcolax 5 mg PO PRN for constipation. 5. Encouragement to attend group therapies. 6. Disposition in progress.    Honor Junes, MD 06/05/2020, 8:02 AM

## 2020-06-05 NOTE — Plan of Care (Signed)
Progress note  D: pt found in bed; compliant with morning medication administration. Pt is fidgety, restless, and blaming others on approach. Pt states they are OCD and a cleaning personal. Pt is fixated on the floors and how staff here aren't cleaning them well enough. Pt states this is causing them anxiety and that they can't relax because "it's filthy". Pt states "I put exactly what you wanted me to on my morning assessment so I can go home". Pt states being here is throwing them out of routine and causing them more anxiety. Pt also states they are "more comfortable" with the female nurses from previous shifts but this Probation officer "will do". Pt denies si/hi/ah/vh and verbally agrees to approach staff if these become apparent or before harming themself/others while at [facility].  A: Pt provided support and encouragement. Pt given medication per protocol and standing orders. Q72m safety checks implemented and continued.  R: Pt safe on the unit. Will continue to monitor.  Pt progressing in the following metrics  Problem: Education: Goal: Knowledge of [name] [name] General Education information/materials will improve Outcome: Progressing Goal: Emotional status will improve Outcome: Progressing Goal: Mental status will improve Outcome: Progressing Goal: Verbalization of understanding the information provided will improve Outcome: Progressing

## 2020-06-05 NOTE — Tx Team (Cosign Needed)
Interdisciplinary Treatment and Diagnostic Plan Update  06/05/2020 Time of Session: 9:50am [name] [name] MRN: [id number]  Principal Diagnosis: MDD (major depressive disorder), recurrent severe, without psychosis ([place])  Secondary Diagnoses: Principal Problem:   MDD (major depressive disorder), recurrent severe, without psychosis ([place]) Active Problems:   Anxiety   Bipolar disorder ([place])   GAD (generalized anxiety disorder)   Current Medications:  Current Facility-Administered Medications  Medication Dose Route Frequency Provider Last Rate Last Admin  . bisacodyl (DULCOLAX) EC tablet 5 mg  5 mg Oral Daily PRN Dagar,  Staggers, MD      . Derrill Memo ON 06/06/2020] DULoxetine (CYMBALTA) DR capsule 40 mg  40 mg Oral Daily Cobos, Fernando A, MD      . hydrOXYzine (ATARAX/VISTARIL) tablet 25 mg  25 mg Oral TID PRN Rozetta Nunnery, NP   25 mg at 06/04/20 2136  . polyethylene glycol (MIRALAX / GLYCOLAX) packet 17 g  17 g Oral Daily PRN Connye Burkitt, NP   17 g at 06/04/20 1410  . rosuvastatin (CRESTOR) tablet 5 mg  5 mg Oral Daily Cobos, Myer Peer, MD   5 mg at 06/05/20 0740  . topiramate (TOPAMAX) tablet 200 mg  200 mg Oral BID Connye Burkitt, NP   200 mg at 06/05/20 0740  . traZODone (DESYREL) tablet 50 mg  50 mg Oral QHS PRN Cobos, Myer Peer, MD       PTA Medications: Medications Prior to Admission  Medication Sig Dispense Refill Last Dose  . cetirizine (ZYRTEC) 10 MG tablet Take 10 mg by mouth daily.     . cholecalciferol (VITAMIN D3) 25 MCG (1000 UNIT) tablet Take 1,000 Units by mouth daily.     Marland Kitchen estradiol (VIVELLE-DOT) 0.1 MG/24HR patch APPLY 1 PATCH TWICE WEEKLY (Patient taking differently: APPLY 1 PATCH TWICE WEEKLY Tuesday and Saturday) 24 patch 3   . meloxicam (MOBIC) 15 MG tablet Take 1 tablet (15 mg total) by mouth daily. 30 tablet 1   . Multiple Vitamin (MULTIVITAMIN) tablet Take 1 tablet by mouth daily.     . rosuvastatin (CRESTOR) 5 MG tablet Take 1 tablet (5 mg total) by  mouth daily. 36 tablet 3   . topiramate (TOPAMAX) 200 MG tablet Take 400 mg by mouth daily.        Patient Stressors: Medication change or noncompliance  Patient Strengths: Average or above average intelligence Capable of independent living General fund of knowledge Motivation for treatment/growth  Treatment Modalities: Medication Management, Group therapy, Case management,  1 to 1 session with clinician, Psychoeducation, Recreational therapy.   Physician Treatment Plan for Primary Diagnosis: MDD (major depressive disorder), recurrent severe, without psychosis ([place]) Long Term Goal(s): Improvement in symptoms so as ready for discharge Improvement in symptoms so as ready for discharge   Short Term Goals: Ability to identify changes in lifestyle to reduce recurrence of condition will improve Ability to disclose and discuss suicidal ideas Ability to demonstrate self-control will improve Compliance with prescribed medications will improve Ability to identify changes in lifestyle to reduce recurrence of condition will improve Ability to disclose and discuss suicidal ideas Ability to identify and develop effective coping behaviors will improve Ability to maintain clinical measurements within normal limits will improve Compliance with prescribed medications will improve  Medication Management: Evaluate patient's response, side effects, and tolerance of medication regimen.  Therapeutic Interventions: 1 to 1 sessions, Unit Group sessions and Medication administration.  Evaluation of Outcomes: Not Met  Physician Treatment Plan for Secondary Diagnosis: Principal Problem:  MDD (major depressive disorder), recurrent severe, without psychosis ([place]) Active Problems:   Anxiety   Bipolar disorder ([place])   GAD (generalized anxiety disorder)  Long Term Goal(s): Improvement in symptoms so as ready for discharge Improvement in symptoms so as ready for discharge   Short Term Goals: Ability to  identify changes in lifestyle to reduce recurrence of condition will improve Ability to disclose and discuss suicidal ideas Ability to demonstrate self-control will improve Compliance with prescribed medications will improve Ability to identify changes in lifestyle to reduce recurrence of condition will improve Ability to disclose and discuss suicidal ideas Ability to identify and develop effective coping behaviors will improve Ability to maintain clinical measurements within normal limits will improve Compliance with prescribed medications will improve     Medication Management: Evaluate patient's response, side effects, and tolerance of medication regimen.  Therapeutic Interventions: 1 to 1 sessions, Unit Group sessions and Medication administration.  Evaluation of Outcomes: Not Met   RN Treatment Plan for Primary Diagnosis: MDD (major depressive disorder), recurrent severe, without psychosis ([place]) Long Term Goal(s): Knowledge of disease and therapeutic regimen to maintain health will improve  Short Term Goals: Ability to remain free from injury will improve, Ability to verbalize frustration and anger appropriately will improve, Ability to identify and develop effective coping behaviors will improve and Compliance with prescribed medications will improve  Medication Management: RN will administer medications as ordered by provider, will assess and evaluate patient's response and provide education to patient for prescribed medication. RN will report any adverse and/or side effects to prescribing provider.  Therapeutic Interventions: 1 on 1 counseling sessions, Psychoeducation, Medication administration, Evaluate responses to treatment, Monitor vital signs and CBGs as ordered, Perform/monitor CIWA, COWS, AIMS and Fall Risk screenings as ordered, Perform wound care treatments as ordered.  Evaluation of Outcomes: Not Met   LCSW Treatment Plan for Primary Diagnosis: MDD (major depressive  disorder), recurrent severe, without psychosis ([place]) Long Term Goal(s): Safe transition to appropriate next level of care at discharge, Engage patient in therapeutic group addressing interpersonal concerns.  Short Term Goals: Engage patient in aftercare planning with referrals and resources, Increase social support, Identify triggers associated with mental health/substance abuse issues and Increase skills for wellness and recovery  Therapeutic Interventions: Assess for all discharge needs, 1 to 1 time with Social worker, Explore available resources and support systems, Assess for adequacy in community support network, Educate family and significant other(s) on suicide prevention, Complete Psychosocial Assessment, Interpersonal group therapy.  Evaluation of Outcomes: Not Met   Progress in Treatment: Attending groups: Yes. Participating in groups: Yes. Taking medication as prescribed: Yes. Toleration medication: Yes. Family/Significant other contact made: No, will contact:  friend. Patient understands diagnosis: Yes. Discussing patient identified problems/goals with staff: Yes. Medical problems stabilized or resolved: Yes. Denies suicidal/homicidal ideation: Yes. Issues/concerns per patient self-inventory: No.   New problem(s) identified: No, Describe:  none  New Short Term/Long Term Goal(s): medication stabilization, elimination of SI thoughts, development of comprehensive mental wellness plan.   Patient Goals:  Patient did not attend.  Discharge Plan or Barriers: Patient recently admitted. CSW will continue to follow and assess for appropriate referrals and possible discharge planning.   Reason for Continuation of Hospitalization: Depression Medication stabilization Suicidal ideation  Estimated Length of Stay: 1-3 days  Attendees: Patient: Patient did not attend. 06/05/2020  Physician:  06/05/2020   Nursing:  06/05/2020   RN Care Manager: 06/05/2020   Social Worker: Darletta Moll, LCSW 06/05/2020  Recreational Therapist:  06/05/2020  Other:  06/05/2020   Other:  06/05/2020   Other: 06/05/2020       Scribe for Treatment Team: Vassie Moselle, LCSW 06/05/2020 10:53 AM

## 2020-06-06 DIAGNOSIS — F332 Major depressive disorder, recurrent severe without psychotic features: Secondary | ICD-10-CM | POA: Diagnosis not present

## 2020-06-06 DIAGNOSIS — F339 Major depressive disorder, recurrent, unspecified: Secondary | ICD-10-CM | POA: Diagnosis present

## 2020-06-06 MED ORDER — ALUM & MAG HYDROXIDE-SIMETH 200-200-20 MG/5ML PO SUSP: 30.0000 mL | ORAL | Status: DC | PRN

## 2020-06-06 MED ORDER — TOPIRAMATE 200 MG PO TABS: 200.0000 mg | ORAL_TABLET | Freq: Two times a day (BID) | ORAL | 0 refills | Status: DC

## 2020-06-06 MED ORDER — TRAZODONE HCL 50 MG PO TABS: 50.0000 mg | ORAL_TABLET | Freq: Every evening | ORAL | 0 refills | Status: DC | PRN

## 2020-06-06 MED ORDER — DULOXETINE HCL 40 MG PO CPEP: 40.0000 mg | ORAL_CAPSULE | Freq: Every day | ORAL | 0 refills | Status: DC

## 2020-06-06 MED ORDER — HYDROXYZINE HCL 25 MG PO TABS: 25.0000 mg | ORAL_TABLET | Freq: Three times a day (TID) | ORAL | 0 refills | Status: DC | PRN

## 2020-06-06 MED ORDER — ACETAMINOPHEN 325 MG PO TABS: 650.0000 mg | ORAL_TABLET | Freq: Four times a day (QID) | ORAL | Status: DC | PRN

## 2020-06-06 MED ORDER — MAGNESIUM HYDROXIDE 400 MG/5ML PO SUSP: 30.0000 mL | Freq: Every day | ORAL | Status: DC | PRN

## 2020-06-06 MED ORDER — BISACODYL 5 MG PO TBEC: 5.0000 mg | DELAYED_RELEASE_TABLET | Freq: Every day | ORAL | 0 refills | Status: DC | PRN

## 2020-06-06 MED ORDER — POLYETHYLENE GLYCOL 3350 17 G PO PACK: 17.0000 g | PACK | Freq: Every day | ORAL | 0 refills | Status: DC | PRN

## 2020-06-06 NOTE — Progress Notes (Signed)
   06/05/20 2200  Psych Admission Type (Psych Patients Only)  Admission Status Voluntary  Psychosocial Assessment  Patient Complaints Anxiety;Insomnia  Eye Contact Fair  Facial Expression Anxious;Pensive;Worried  Affect Anxious;Preoccupied  Surveyor, quantity;Attention-seeking;Demanding;Hypervigilant  Motor Activity Fidgety;Restless  Appearance/Hygiene Unremarkable  Behavior Characteristics Cooperative;Anxious  Mood Anxious;Pleasant  Thought Horticulturist, commercial of ideas  Content Blaming others;Obsessions  Delusions Persecutory  Perception WDL  Hallucination None reported or observed  Judgment Poor  Confusion None  Danger to Self  Current suicidal ideation? Denies  Danger to Others  Danger to Others None reported or observed  C/O Constipation prn Miralax given no results yet.
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
## 2020-06-06 NOTE — Progress Notes (Signed)
Adult Psychoeducational Group Note  Date:  06/06/2020 Time:  10:50 AM  Group Topic/Focus:  Goals Group:   The focus of this group is to help patients establish daily goals to achieve during treatment and discuss how the patient can incorporate goal setting into their daily lives to aide in recovery.  Participation Level:  Active  Participation Quality:  Appropriate  Affect:  Appropriate  Cognitive:  Alert  Insight: Appropriate  Engagement in Group:  Engaged  Modes of Intervention:  Discussion  Additional Comments:  Pt attended group and participated in discussion.  Dashel Goines R Andrian Urbach 06/06/2020, 10:50 AM

## 2020-06-06 NOTE — Plan of Care (Signed)
Discharge note  Patient verbalizes readiness for discharge. Follow up plan explained, AVS, Transition record and SRA given. Prescriptions and teaching provided. Belongings returned and signed for. Suicide safety plan completed and signed. Patient verbalizes understanding. Patient denies SI/HI and assures this Probation officer they will seek assistance should that change. Patient discharged to lobby where sister was waiting.  Problem: Education: Goal: Knowledge of [place] [facility] Education information/materials will improve Outcome: Adequate for Discharge Goal: Emotional status will improve Outcome: Adequate for Discharge Goal: Mental status will improve Outcome: Adequate for Discharge Goal: Verbalization of understanding the information provided will improve Outcome: Adequate for Discharge   Problem: Activity: Goal: Interest or engagement in activities will improve Outcome: Adequate for Discharge Goal: Sleeping patterns will improve Outcome: Adequate for Discharge   Problem: Coping: Goal: Ability to verbalize frustrations and anger appropriately will improve Outcome: Adequate for Discharge Goal: Ability to demonstrate self-control will improve Outcome: Adequate for Discharge   Problem: Health Behavior/Discharge Planning: Goal: Identification of resources available to assist in meeting health care needs will improve Outcome: Adequate for Discharge Goal: Compliance with treatment plan for underlying cause of condition will improve Outcome: Adequate for Discharge   Problem: Physical Regulation: Goal: Ability to maintain clinical measurements within normal limits will improve Outcome: Adequate for Discharge   Problem: Safety: Goal: Periods of time without injury will increase Outcome: Adequate for Discharge   Problem: Education: Goal: Utilization of techniques to improve thought processes will improve Outcome: Adequate for Discharge   Problem: Activity: Goal: Interest or engagement  in leisure activities will improve Outcome: Adequate for Discharge   Problem: Coping: Goal: Coping ability will improve Outcome: Adequate for Discharge Goal: Will verbalize feelings Outcome: Adequate for Discharge   Problem: Health Behavior/Discharge Planning: Goal: Ability to make decisions will improve Outcome: Adequate for Discharge Goal: Compliance with therapeutic regimen will improve Outcome: Adequate for Discharge   Problem: Role Relationship: Goal: Will demonstrate positive changes in social behaviors and relationships Outcome: Adequate for Discharge   Problem: Safety: Goal: Ability to identify and utilize support systems that promote safety will improve Outcome: Adequate for Discharge   Problem: Self-Concept: Goal: Will verbalize positive feelings about self Outcome: Adequate for Discharge Goal: Level of anxiety will decrease Outcome: Adequate for Discharge   Problem: Coping: Goal: Coping ability will improve Outcome: Adequate for Discharge   Problem: Medication: Goal: Compliance with prescribed medication regimen will improve Outcome: Adequate for Discharge

## 2020-06-06 NOTE — BHH Suicide Risk Assessment (Signed)
[facility] Discharge Suicide Risk Assessment   Principal Problem: Severe episode of recurrent major depressive disorder, without psychotic features ([place]) Discharge Diagnoses: Principal Problem:   Severe episode of recurrent major depressive disorder, without psychotic features ([place]) Active Problems:   Anxiety   Bipolar disorder (HCC)   GAD (generalized anxiety disorder)   MDD (major depressive disorder), recurrent severe, without psychosis ([name])   Major depressive disorder, recurrent episode ([place])   Total Time spent with patient: 30 minutes  Musculoskeletal: Strength & Muscle Tone: within normal limits Gait & Station: normal Patient leans: N/A  Psychiatric Specialty Exam: Review of Systems denies headache , no chest pain, no shortness of breath, no vomiting  Blood pressure (!) 124/92, pulse 68, temperature 98 F (36.7 C), temperature source Oral, resp. rate 16, height 5\' 1"  (1.549 m), weight 60.3 kg, last menstrual period 11/02/2000, SpO2 99 %.Body mass index is 25.13 kg/m.  General Appearance: Casual  Eye Contact::  Good  Speech:  Normal Rate409  Volume:  Normal  Mood:  reports mood is improved, "OK"  Affect:  more reactive, fuller in range, vaguely anxious   Thought Process:  Linear and Descriptions of Associations: Intact  Orientation:  Full (Time, Place, and Person)  Thought Content:  no hallucinations, no delusions, not internally preoccupied   Suicidal Thoughts:  No denies suicidal or self injurious ideations , denies homicidal or violent ideations  Homicidal Thoughts:  No  Memory:  recent and remote grossly intact   Judgement:  Other:  improving   Insight:  fair- improving   Psychomotor Activity:  Normal  Concentration:  Good  Recall:  Good  Fund of Knowledge:Good  Language: Good  Akathisia:  Negative  Handed:  Right  AIMS (if indicated):     Assets:  Communication Skills Desire for Improvement Resilience  Sleep:  Number of Hours: 6.5  Cognition: WNL  ADL's:   Intact   Mental Status Per Nursing Assessment::   On Admission:  NA  Demographic Factors:  68, divorced, has three adult children, lives alone, self employed .  Loss Factors: Relationship stressors with an adult daughter   Historical Factors: History of depression, history of a  Prior psychiatric admission about 15 years ago for depression, reports history of obsessive symptoms/ideations relating to cleanliness/compulsive cleaning during periods of increased stress   Risk Reduction Factors:   Sense of responsibility to family, Employed, Positive social support and Positive coping skills or problem solving skills  Continued Clinical Symptoms:  Today patient presents alert, attentive, calm without psychomotor agitation, mood is described as "okay", describes feeling better and not depressed today.  Affect is reactive, less anxious.  No thought disorder.  Denies suicidal ideations.  No homicidal or violent ideations.  No hallucinations, no delusions.  She states she has had good phone conversations with her daughters and family stressors that contributed to admission have abated. Behavior on unit has been calm and in good control.  No disruptive or agitated behaviors.  Noticed to focus on issues of cleanliness/concerns that unit could be more clean.  Denies medication side effects. With her expressed consent I spoke with her daughter, [name], who corroborates the patient is improving , and who is in agreement with discharge today.  Cognitive Features That Contribute To Risk:  No gross cognitive deficits noted upon discharge. Is alert , attentive, and oriented x 3    Suicide Risk:  Mild:  Suicidal ideation of limited frequency, intensity, duration, and specificity.  There are no identifiable plans, no associated intent,  mild dysphoria and related symptoms, good self-control (both objective and subjective assessment), few other risk factors, and identifiable protective factors, including  available and accessible social support.    Plan Of Care/Follow-up recommendations:  Activity:  As tolerated Diet:  Regular Tests:  NA Other:  See below  Patient is expressing readiness for discharge.  No current grounds for involuntary commitment.  Leaving unit in good spirits.  Plans to return home.  Plans to follow-up for further outpatient psychiatric services and with her PCP for medical management as needed.   Jenne Campus, MD 06/06/2020, 10:49 AM

## 2020-06-06 NOTE — BHH Suicide Risk Assessment (Signed)
[place] INPATIENT:  Family/Significant Other Suicide Prevention Education  Suicide Prevention Education: Education Completed; Bradly Bienenstock ([phone]) has been identified by the patient as the family member/significant other with whom the patient will be residing, and identified as the person(s) who will aid the patient in the event of a mental health crisis (suicidal ideations/suicide attempt).  With written consent from the patient, the family member/significant other has been provided the following suicide prevention education, prior to the and/or following the discharge of the patient.   Per patients friend Myra she feels this patient has been improving since being started on medications. Myra states she has no safety concerns with this patient discharging on this date.   Darletta Moll MSW, LCSW Clincal Social Worker  [facility]

## 2020-06-06 NOTE — Progress Notes (Signed)
  [facility] Adult Case Management Discharge Plan :  Will you be returning to the same living situation after discharge:  Yes,  to home. At discharge, do you have transportation home?: Yes,  daughter to pick up. Do you have the ability to pay for your medications: Yes,  has insurance.  Release of information consent forms completed and in the chart;  Patient's signature needed at discharge.   Next level of care provider has access to [place] and Suicide Prevention discussed: Yes,  with friend.  Have you used any form of tobacco in the last 30 days? (Cigarettes, Smokeless Tobacco, Cigars, and/or Pipes): No  Has patient been referred to the Quitline?: N/A patient is not a smoker  Patient has been referred for addiction treatment: [place], LCSW 06/06/2020, 10:09 AM

## 2020-07-22 ENCOUNTER — Other Ambulatory Visit: Payer: Self-pay

## 2020-07-22 ENCOUNTER — Encounter: Payer: Self-pay | Admitting: Internal Medicine

## 2020-07-22 ENCOUNTER — Ambulatory Visit: Payer: Self-pay | Admitting: Internal Medicine

## 2020-07-22 ENCOUNTER — Telehealth: Payer: Self-pay | Admitting: Internal Medicine

## 2020-07-22 VITALS — BP 120/90 | HR 78 | Temp 98.3°F | Ht 61.0 in | Wt 137.0 lb

## 2020-07-22 DIAGNOSIS — Z8659 Personal history of other mental and behavioral disorders: Secondary | ICD-10-CM

## 2020-07-22 DIAGNOSIS — M5431 Sciatica, right side: Secondary | ICD-10-CM

## 2020-07-22 DIAGNOSIS — Z8669 Personal history of other diseases of the nervous system and sense organs: Secondary | ICD-10-CM

## 2020-07-22 DIAGNOSIS — F4321 Adjustment disorder with depressed mood: Secondary | ICD-10-CM

## 2020-07-22 MED ORDER — ROSUVASTATIN CALCIUM 5 MG PO TABS: 5.0000 mg | ORAL_TABLET | Freq: Every day | ORAL | 3 refills | Status: DC

## 2020-07-22 MED ORDER — MELOXICAM 15 MG PO TABS: 15.0000 mg | ORAL_TABLET | Freq: Every day | ORAL | 2 refills | Status: DC

## 2020-07-22 NOTE — Telephone Encounter (Signed)
[name] [name] [phone]  [name] called to say she is having lower back pain again and she tried to get her medication filled and pharmacy said she needed an office visit before she could get any more refills. She is off work today and would like to come in this afternoon.

## 2020-07-22 NOTE — Telephone Encounter (Signed)
OK 

## 2020-07-22 NOTE — Telephone Encounter (Signed)
scheduled

## 2020-07-27 ENCOUNTER — Encounter: Payer: Self-pay | Admitting: Internal Medicine

## 2020-07-27 NOTE — Progress Notes (Signed)
   Subjective:    Patient ID: [name] [name], female    DOB: [birth year], 59 y.o.   MRN: [id number]  HPI 59 year old Female, longstanding patient in this practice, in today with recurrent back pain.  She cleans houses for some 9 customers.  There is a lot of scrubbing and bending with that job.  Was seen here  July 26 with situational depression related to grandchildren living in her town home and not paying rent.  She was subsequently seen at [facility] for evaluation on August 3.  When seen here on July 26, patient was advised to go to [facility] for medication management.  Phone number and location provided.  She was complaining of back pain at the time and was prescribed meloxicam.  Apparently at [facility] health, was started on Cymbalta 30 mg daily for depression.  Was prescribed trazodone 50 mg at bedtime for insomnia and Atarax 25 mg up to 3 times daily for anxiety.  Records indicate she was also prescribed Topamax 200 mg twice daily to prevent migraine headaches and trazodone 50 mg at bedtime for sleep and that Cymbalta was increased to 40 mg daily by nurse practitioner in addition to Atarax up to 3 times daily in early August  Patient says she is not taking these medications at present time.  She does not feel that she needs them.  She has a history of Bipolar disorder and depression.  She denies suicidal ideations.  Apparently has been on Abilify, BuSpar, Lamictal and Effexor in the past at various times.  Used to see Dr. Caprice Beaver who is no longer in private practice.  She says her son is home from hiking the [place] trail and brought a girlfriend with him and she likes very much.  In March [facility] with right sciatica and was treated with Tylenol and over-the-counter Motrin.  Review of Systems see above-continued situational stress with grandchildren who are not paying rent on her town home.  She thinks  she is going to ask him to move out so she can sell the town home     Objective:   Physical Exam Straight leg raising is negative at 90 degrees bilaterally.  Muscle strength is normal in the lower extremities.  Deep tendon reflexes 2+ and symmetrical in the knees.  Gait is normal.  Is having radiculopathy symptoms.  I do not see an MRI of the LS spine on file in [place]:  Right sciatica  History of bipolar disorder  Situational depression related to family situation with a townhome  Plan: She indicates that she generally gets relief with Mobic 15 mg daily this was refilled today.  Does not want to go to physical therapy.  Explained that we cannot get MRI of the lumbar spine unless she goes to physical therapy consistently for 3 weeks.  Return as needed.

## 2020-07-27 NOTE — Patient Instructions (Addendum)
Take Mobic 15 mg daily as needed for right sciatica.  Encourage patient to reconsider counseling for situational stress.

## 2020-08-07 ENCOUNTER — Other Ambulatory Visit (HOSPITAL_COMMUNITY): Payer: Self-pay | Admitting: Psychiatry

## 2020-11-04 ENCOUNTER — Inpatient Hospital Stay (HOSPITAL_COMMUNITY)
Admission: EM | Admit: 2020-11-04 | Discharge: 2020-11-08 | DRG: 917 | Disposition: A | Discharge status: Other Acute Inpatient Hospital | Payer: 59 | Attending: Internal Medicine | Admitting: Internal Medicine

## 2020-11-04 ENCOUNTER — Emergency Department (HOSPITAL_COMMUNITY): Payer: 59

## 2020-11-04 ENCOUNTER — Other Ambulatory Visit: Payer: Self-pay

## 2020-11-04 DIAGNOSIS — T424X2A Poisoning by benzodiazepines, intentional self-harm, initial encounter: Secondary | ICD-10-CM | POA: Diagnosis not present

## 2020-11-04 DIAGNOSIS — M1990 Unspecified osteoarthritis, unspecified site: Secondary | ICD-10-CM | POA: Diagnosis present

## 2020-11-04 DIAGNOSIS — Z791 Long term (current) use of non-steroidal anti-inflammatories (NSAID): Secondary | ICD-10-CM

## 2020-11-04 DIAGNOSIS — Z20822 Contact with and (suspected) exposure to covid-19: Secondary | ICD-10-CM | POA: Diagnosis present

## 2020-11-04 DIAGNOSIS — E785 Hyperlipidemia, unspecified: Secondary | ICD-10-CM | POA: Diagnosis present

## 2020-11-04 DIAGNOSIS — Z87891 Personal history of nicotine dependence: Secondary | ICD-10-CM

## 2020-11-04 DIAGNOSIS — K3189 Other diseases of stomach and duodenum: Secondary | ICD-10-CM

## 2020-11-04 DIAGNOSIS — Z79899 Other long term (current) drug therapy: Secondary | ICD-10-CM

## 2020-11-04 DIAGNOSIS — R4182 Altered mental status, unspecified: Secondary | ICD-10-CM | POA: Diagnosis not present

## 2020-11-04 DIAGNOSIS — F339 Major depressive disorder, recurrent, unspecified: Secondary | ICD-10-CM | POA: Diagnosis present

## 2020-11-04 DIAGNOSIS — Z23 Encounter for immunization: Secondary | ICD-10-CM

## 2020-11-04 DIAGNOSIS — G929 Unspecified toxic encephalopathy: Secondary | ICD-10-CM | POA: Diagnosis present

## 2020-11-04 DIAGNOSIS — F319 Bipolar disorder, unspecified: Secondary | ICD-10-CM | POA: Diagnosis present

## 2020-11-04 DIAGNOSIS — F4310 Post-traumatic stress disorder, unspecified: Secondary | ICD-10-CM | POA: Diagnosis present

## 2020-11-04 DIAGNOSIS — T50902A Poisoning by unspecified drugs, medicaments and biological substances, intentional self-harm, initial encounter: Secondary | ICD-10-CM

## 2020-11-04 DIAGNOSIS — Z8249 Family history of ischemic heart disease and other diseases of the circulatory system: Secondary | ICD-10-CM

## 2020-11-04 DIAGNOSIS — Z818 Family history of other mental and behavioral disorders: Secondary | ICD-10-CM

## 2020-11-04 DIAGNOSIS — R4189 Other symptoms and signs involving cognitive functions and awareness: Secondary | ICD-10-CM

## 2020-11-04 DIAGNOSIS — T50901A Poisoning by unspecified drugs, medicaments and biological substances, accidental (unintentional), initial encounter: Secondary | ICD-10-CM | POA: Diagnosis present

## 2020-11-04 LAB — CBC WITH DIFFERENTIAL/PLATELET
Abs Immature Granulocytes: 0.01 10*3/uL (ref 0.00–0.07)
Basophils Absolute: 0 10*3/uL (ref 0.0–0.1)
Basophils Relative: 0 %
Eosinophils Absolute: 0.1 10*3/uL (ref 0.0–0.5)
Eosinophils Relative: 1 %
HCT: 41.8 % (ref 36.0–46.0)
Hemoglobin: 13.9 g/dL (ref 12.0–15.0)
Immature Granulocytes: 0 %
Lymphocytes Relative: 32 %
Lymphs Abs: 1.9 10*3/uL (ref 0.7–4.0)
MCH: 30.5 pg (ref 26.0–34.0)
MCHC: 33.3 g/dL (ref 30.0–36.0)
MCV: 91.9 fL (ref 80.0–100.0)
Monocytes Absolute: 0.5 10*3/uL (ref 0.1–1.0)
Monocytes Relative: 9 %
Neutro Abs: 3.5 10*3/uL (ref 1.7–7.7)
Neutrophils Relative %: 58 %
Platelets: 181 10*3/uL (ref 150–400)
RBC: 4.55 MIL/uL (ref 3.87–5.11)
RDW: 12.4 % (ref 11.5–15.5)
WBC: 6 10*3/uL (ref 4.0–10.5)
nRBC: 0 % (ref 0.0–0.2)

## 2020-11-04 LAB — COMPREHENSIVE METABOLIC PANEL
ALT: 11 U/L (ref 0–44)
ALT: 14 U/L (ref 0–44)
AST: 25 U/L (ref 15–41)
AST: 18 U/L (ref 15–41)
Albumin: 4 g/dL (ref 3.5–5.0)
Albumin: 4 g/dL (ref 3.5–5.0)
Alkaline Phosphatase: 43 U/L (ref 38–126)
Alkaline Phosphatase: 41 U/L (ref 38–126)
Anion gap: 11 (ref 5–15)
Anion gap: 8 (ref 5–15)
BUN: 14 mg/dL (ref 6–20)
BUN: 13 mg/dL (ref 6–20)
CO2: 16 mmol/L — ABNORMAL LOW (ref 22–32)
CO2: 21 mmol/L — ABNORMAL LOW (ref 22–32)
Calcium: 9 mg/dL (ref 8.9–10.3)
Calcium: 9.3 mg/dL (ref 8.9–10.3)
Chloride: 114 mmol/L — ABNORMAL HIGH (ref 98–111)
Chloride: 113 mmol/L — ABNORMAL HIGH (ref 98–111)
Creatinine, Ser: 0.82 mg/dL (ref 0.44–1.00)
Creatinine, Ser: 0.83 mg/dL (ref 0.44–1.00)
GFR, Estimated: >60 mL/min (ref >60)
GFR, Estimated: >60 mL/min (ref >60)
Glucose, Bld: 82 mg/dL (ref 70–99)
Glucose, Bld: 87 mg/dL (ref 70–99)
Potassium: 4.1 mmol/L (ref 3.5–5.1)
Potassium: 3.9 mmol/L (ref 3.5–5.1)
Sodium: 141 mmol/L (ref 135–145)
Sodium: 142 mmol/L (ref 135–145)
Total Bilirubin: 0.7 mg/dL (ref 0.3–1.2)
Total Bilirubin: 0.9 mg/dL (ref 0.3–1.2)
Total Protein: 7.4 g/dL (ref 6.5–8.1)
Total Protein: 7.4 g/dL (ref 6.5–8.1)

## 2020-11-04 LAB — I-STAT VENOUS BLOOD GAS, ED
Acid-base deficit: 7 mmol/L — ABNORMAL HIGH (ref 0.0–2.0)
Bicarbonate: 18.5 mmol/L — ABNORMAL LOW (ref 20.0–28.0)
Calcium, Ion: 1.13 mmol/L — ABNORMAL LOW (ref 1.15–1.40)
HCT: 40 % (ref 36.0–46.0)
Hemoglobin: 13.6 g/dL (ref 12.0–15.0)
O2 Saturation: 92 %
Potassium: 3.8 mmol/L (ref 3.5–5.1)
Sodium: 145 mmol/L (ref 135–145)
TCO2: 20 mmol/L — ABNORMAL LOW (ref 22–32)
pCO2, Ven: 35 mmHg — ABNORMAL LOW (ref 44.0–60.0)
pH, Ven: 7.33 (ref 7.250–7.430)
pO2, Ven: 68 mmHg — ABNORMAL HIGH (ref 32.0–45.0)

## 2020-11-04 LAB — RESP PANEL BY RT-PCR (FLU A&B, COVID) ARPGX2
Influenza A by PCR: NEGATIVE
Influenza B by PCR: NEGATIVE
SARS Coronavirus 2 by RT PCR: NEGATIVE

## 2020-11-04 LAB — APTT: aPTT: 26 seconds (ref 24–36)

## 2020-11-04 LAB — I-STAT BETA HCG BLOOD, ED (MC, WL, AP ONLY): I-stat hCG, quantitative: <5 m[IU]/mL (ref <5)

## 2020-11-04 LAB — PROTIME-INR
INR: 1.1 (ref 0.8–1.2)
Prothrombin Time: 13.7 seconds (ref 11.4–15.2)

## 2020-11-04 LAB — CBG MONITORING, ED: Glucose-Capillary: 80 mg/dL (ref 70–99)

## 2020-11-04 LAB — ETHANOL: Alcohol, Ethyl (B): <10 mg/dL (ref <10)

## 2020-11-04 MED ORDER — NALOXONE HCL 0.4 MG/ML IJ SOLN
INTRAMUSCULAR | Status: AC
  Filled 2020-11-04: qty 1

## 2020-11-04 MED ORDER — SODIUM CHLORIDE 0.9 % IV BOLUS
1000.0000 mL | Freq: Once | INTRAVENOUS | Status: AC
  Administered 2020-11-04: 1000 mL via INTRAVENOUS

## 2020-11-04 NOTE — ED Triage Notes (Addendum)
Pt bib ems from home where family found her on the ground after hearing a "thud". Pt had admitted to using xanax prior to the fall. Pt now altered, pupil constricted and fixed, no purposeful movements per EMS. CBG 153, HR 90, 145/100 per ems. 22g Rhand. LKW 1700 today.

## 2020-11-04 NOTE — ED Provider Notes (Addendum)
[facility] EMERGENCY DEPARTMENT Provider Note   CSN: [id number] Arrival date & time: 11/04/20  1810     History Chief Complaint  Patient presents with  . Altered Mental Status    [name] [name] is a 60 y.o. female.  HPI     60 year old female comes in a chief complaint of suspected overdose and altered mental status.  Level 5 caveat for somnolent, unresponsive patient.  According to the patient's daughter, they had a difficult conversation with the patient earlier today.  Prior to them calling EMS, they heard a loud sound from patient falling.  When they went to check on her, she was extremely sleepy and unresponsive.  Patient did tell him that she took Xanax and Vicodin.  They think she said she took 6 Xanax.  Patient has had history of SI before.  Patient also has history of polysubstance abuse and gets her Xanax and Vicodin from the street.  They do not think patient is taking her psych medication.  Patient has history of bipolar disorder.  Past Medical History:  Diagnosis Date  . Anxiety   . Arthritis   . Helicobacter pylori (H. pylori) 07/2010  . Migraine     Patient Active Problem List   Diagnosis Date Noted  . Drug overdose 11/05/2020  . Major depressive disorder, recurrent episode (HCC) 06/06/2020  . MDD (major depressive disorder), recurrent severe, without psychosis (HCC) 06/03/2020  . Pruritus 10/17/2018  . Other allergic rhinitis 10/17/2018  . Severe episode of recurrent major depressive disorder, without psychotic features (HCC) 03/24/2016  . PTSD (post-traumatic stress disorder) 03/24/2016  . GAD (generalized anxiety disorder) 03/24/2016  . Functional constipation 08/05/2015  . Chest pain, atypical 06/13/2012  . Pleuritic pain 06/13/2012  . Tobacco abuse, in remission 06/13/2012  . Cough with intermittant hemoptysis 06/13/2012  . Back pain 06/13/2012  . Anxiety   . Bipolar disorder (HCC)   . Migraine   . INFECTIOUS DIARRHEA  09/04/2010  . HELICOBACTER PYLORI INFECTION 09/04/2010  . ABDOMINAL PAIN-LUQ 09/04/2010    Past Surgical History:  Procedure Laterality Date  . CATARACT EXTRACTION     bilateral  . CERVICAL FUSION    . TUBAL LIGATION    . UPPER GASTROINTESTINAL ENDOSCOPY    . VAGINAL HYSTERECTOMY  2001   USO by history-sono2011 question of both ovaries present     OB History    Gravida  4   Para  3   Term      Preterm      AB  1   Living  3     SAB  1   IAB      Ectopic      Multiple      Live Births              Family History  Adopted: Yes  Problem Relation Age of Onset  . Hypertension Mother   . Mental illness Mother   . Hypertension Brother     Social History   Tobacco Use  . Smoking status: Former Smoker    Types: Cigarettes  . Smokeless tobacco: Never Used  Vaping Use  . Vaping Use: Never used  Substance Use Topics  . Alcohol use: Yes    Alcohol/week: 0.0 standard drinks    Comment: Very rare  . Drug use: No    Home Medications Prior to Admission medications   Medication Sig Start Date End Date Taking? Authorizing Provider  rosuvastatin (CRESTOR) 5 MG tablet  Take 1 tablet (5 mg total) by mouth daily. 07/22/20  Yes Baxley, Cresenciano Lick, MD  topiramate (TOPAMAX) 200 MG tablet Take 1 tablet (200 mg total) by mouth 2 (two) times daily. For mood stabilization 06/06/20  Yes Nwoko, Herbert Pun I, NP  acetaminophen (TYLENOL) 500 MG tablet Take 500 mg by mouth every 6 (six) hours as needed for headache.    [provider]  bisacodyl (DULCOLAX) 5 MG EC tablet Take 1 tablet (5 mg total) by mouth daily as needed. (May buy from over the counter): For constipation Patient not taking: No sig reported 06/06/20   Lindell Spar I, NP  DULoxetine 40 MG CPEP Take 40 mg by mouth daily. For depression Patient not taking: No sig reported 06/07/20   Lindell Spar I, NP  hydrOXYzine (ATARAX/VISTARIL) 25 MG tablet Take 1 tablet (25 mg total) by mouth 3 (three) times daily as needed for  anxiety. Patient not taking: No sig reported 06/06/20   Lindell Spar I, NP  meloxicam (MOBIC) 15 MG tablet Take 1 tablet (15 mg total) by mouth daily. Patient not taking: No sig reported 07/22/20   Elby Showers, MD  polyethylene glycol (MIRALAX / GLYCOLAX) 17 g packet Take 17 g by mouth daily as needed. (May buy from over the counter): For constipation Patient not taking: No sig reported 06/06/20   Lindell Spar I, NP  traZODone (DESYREL) 50 MG tablet Take 1 tablet (50 mg total) by mouth at bedtime as needed for sleep. Patient not taking: No sig reported 06/06/20   Encarnacion Slates, NP    Allergies    Patient has no known allergies.  Review of Systems   Review of Systems  Unable to perform ROS: Patient unresponsive    Physical Exam Updated Vital Signs BP 129/79   Pulse 65   Temp (S) (!) 96.3 F (35.7 C) (Rectal) Comment (Src): patient placed in bair hugger  Resp 16   Ht 5\' 1"  (1.549 m)   Wt 62.1 kg   LMP 11/02/2000   SpO2 100%   BMI 25.87 kg/m   Physical Exam Vitals and nursing note reviewed.  Constitutional:      Appearance: She is well-developed.     Comments: Obtunded  HENT:     Head: Normocephalic and atraumatic.  Eyes:     Extraocular Movements: EOM normal.     Comments: 5 mm and equal, no nystagmus  Cardiovascular:     Rate and Rhythm: Normal rate.  Pulmonary:     Effort: Pulmonary effort is normal.  Abdominal:     General: Bowel sounds are normal.  Musculoskeletal:     Cervical back: Normal range of motion and neck supple.  Skin:    General: Skin is warm and dry.  Neurological:     Comments: Downgoing Babinski, noted to have plantar extension and slightly stiff/rigid over her knee and elbow joint, responding to painful stimuli.     ED Results / Procedures / Treatments   Labs (all labs ordered are listed, but only abnormal results are displayed) Labs Reviewed  COMPREHENSIVE METABOLIC PANEL - Abnormal; Notable for the following components:      Result Value    Chloride 113 (*)    CO2 21 (*)    All other components within normal limits  COMPREHENSIVE METABOLIC PANEL - Abnormal; Notable for the following components:   Chloride 114 (*)    CO2 16 (*)    All other components within normal limits  I-STAT VENOUS BLOOD GAS,  ED - Abnormal; Notable for the following components:   pCO2, Ven 35.0 (*)    pO2, Ven 68.0 (*)    Bicarbonate 18.5 (*)    TCO2 20 (*)    Acid-base deficit 7.0 (*)    Calcium, Ion 1.13 (*)    All other components within normal limits  RESP PANEL BY RT-PCR (FLU A&B, COVID) ARPGX2  CBC WITH DIFFERENTIAL/PLATELET  ETHANOL  PROTIME-INR  APTT  URINALYSIS, COMPLETE (UACMP) WITH MICROSCOPIC  RAPID URINE DRUG SCREEN, HOSP PERFORMED  SALICYLATE LEVEL  ACETAMINOPHEN LEVEL  CBG MONITORING, ED  I-STAT BETA HCG BLOOD, ED ([facility], [facility], [facility] ONLY)    EKG EKG Interpretation  Date/Time:  Monday November 04 2020 18:22:43 EST Ventricular Rate:  96 PR Interval:    QRS Duration: 104 QT Interval:  347 QTC Calculation: 439 R Axis:   54 Text Interpretation: Sinus rhythm Probable left atrial enlargement Borderline T abnormalities, anterior leads No acute changes No significant change since last tracing Confirmed by Derwood Kaplan ([id number]) on 11/04/2020 6:32:13 PM   Radiology CT Head Wo Contrast  Result Date: 11/04/2020 CLINICAL DATA:  Head trauma, abnormal mental status. Neck trauma, focal neuro deficit or paresthesia. Additional history provided: Patient found down. EXAM: CT HEAD WITHOUT CONTRAST CT CERVICAL SPINE WITHOUT CONTRAST TECHNIQUE: Multidetector CT imaging of the head and cervical spine was performed following the standard protocol without intravenous contrast. Multiplanar CT image reconstructions of the cervical spine were also generated. COMPARISON:  Head CT 02/22/2019. Radiographs of the cervical spine 01/19/2013. FINDINGS: CT HEAD FINDINGS Brain: Cerebral volume is normal for age. There is no acute intracranial hemorrhage. No  demarcated cortical infarct. No extra-axial fluid collection. No evidence of intracranial mass. No midline shift. Vascular: No hyperdense vessel.  Atherosclerotic calcifications. Skull: Normal. Negative for fracture or focal lesion. Sinuses/Orbits: Visualized orbits show no acute finding. Trace ethmoid sinus mucosal thickening. CT CERVICAL SPINE FINDINGS Alignment: Straightening of the expected cervical lordosis. No significant spondylolisthesis. Skull base and vertebrae: The basion-dental and atlanto-dental intervals are maintained.No evidence of acute fracture to the cervical spine. Congenital nonunion of the posterior arch of C1. Prior C4-C6 ACDF. Solid fusion across the disc spaces and facet joints at these levels. No evidence of hardware compromise. Soft tissues and spinal canal: No prevertebral fluid or swelling. No visible canal hematoma. Disc levels: Cervical spondylosis most notably as follows. At C3-C4, there is mild disc degeneration with a disc bulge and uncovertebral hypertrophy. Apparent mild spinal canal stenosis at this level. At C6-C7, there is moderate disc degeneration with a disc bulge and uncovertebral hypertrophy. Bilateral neural foraminal narrowing with suspected mild/moderate spinal canal stenosis at this level. Upper chest: No consolidation within the imaged lung apices. No visible pneumothorax. IMPRESSION: CT head: No evidence of acute intracranial abnormality. CT cervical spine: 1. No evidence of acute fracture to the cervical spine. 2. Prior C4-C6 ACDF. No evidence of hardware compromise. 3. Cervical spondylosis as described and greatest at C3-C4 and C6-C7. Electronically Signed   By: Jackey Loge DO   On: 11/04/2020 18:52   CT Cervical Spine Wo Contrast  Result Date: 11/04/2020 CLINICAL DATA:  Head trauma, abnormal mental status. Neck trauma, focal neuro deficit or paresthesia. Additional history provided: Patient found down. EXAM: CT HEAD WITHOUT CONTRAST CT CERVICAL SPINE WITHOUT  CONTRAST TECHNIQUE: Multidetector CT imaging of the head and cervical spine was performed following the standard protocol without intravenous contrast. Multiplanar CT image reconstructions of the cervical spine were also generated. COMPARISON:  Head CT 02/22/2019.  Radiographs of the cervical spine 01/19/2013. FINDINGS: CT HEAD FINDINGS Brain: Cerebral volume is normal for age. There is no acute intracranial hemorrhage. No demarcated cortical infarct. No extra-axial fluid collection. No evidence of intracranial mass. No midline shift. Vascular: No hyperdense vessel.  Atherosclerotic calcifications. Skull: Normal. Negative for fracture or focal lesion. Sinuses/Orbits: Visualized orbits show no acute finding. Trace ethmoid sinus mucosal thickening. CT CERVICAL SPINE FINDINGS Alignment: Straightening of the expected cervical lordosis. No significant spondylolisthesis. Skull base and vertebrae: The basion-dental and atlanto-dental intervals are maintained.No evidence of acute fracture to the cervical spine. Congenital nonunion of the posterior arch of C1. Prior C4-C6 ACDF. Solid fusion across the disc spaces and facet joints at these levels. No evidence of hardware compromise. Soft tissues and spinal canal: No prevertebral fluid or swelling. No visible canal hematoma. Disc levels: Cervical spondylosis most notably as follows. At C3-C4, there is mild disc degeneration with a disc bulge and uncovertebral hypertrophy. Apparent mild spinal canal stenosis at this level. At C6-C7, there is moderate disc degeneration with a disc bulge and uncovertebral hypertrophy. Bilateral neural foraminal narrowing with suspected mild/moderate spinal canal stenosis at this level. Upper chest: No consolidation within the imaged lung apices. No visible pneumothorax. IMPRESSION: CT head: No evidence of acute intracranial abnormality. CT cervical spine: 1. No evidence of acute fracture to the cervical spine. 2. Prior C4-C6 ACDF. No evidence of  hardware compromise. 3. Cervical spondylosis as described and greatest at C3-C4 and C6-C7. Electronically Signed   By: Kellie Simmering DO   On: 11/04/2020 18:52    Procedures .Critical Care Performed by: Varney Biles, MD Authorized by: Varney Biles, MD   Critical care provider statement:    Critical care time (minutes):  36   Critical care was necessary to treat or prevent imminent or life-threatening deterioration of the following conditions:  Toxidrome   Critical care was time spent personally by me on the following activities:  Discussions with consultants, evaluation of patient's response to treatment, examination of patient, ordering and performing treatments and interventions, ordering and review of laboratory studies, ordering and review of radiographic studies, pulse oximetry, re-evaluation of patient's condition, obtaining history from patient or surrogate and review of old charts   (including critical care time)  Medications Ordered in ED Medications  naloxone (NARCAN) 0.4 MG/ML injection (has no administration in time range)  sodium chloride 0.9 % bolus 1,000 mL (0 mLs Intravenous Stopped 11/04/20 2353)    ED Course  I have reviewed the triage vital signs and the nursing notes.  Pertinent labs & imaging results that were available during my care of the patient were reviewed by me and considered in my medical decision making (see chart for details).  Clinical Course as of 11/05/20 0024  Mon Nov 04, 2020  2100 Patient reassessed.  She continues to be  continues to be obtunded.  She is aroused with painful stimuli.  Her GCS is 8 (2-1-5) [AN]  2247 Patient reassessed.  End-tidal CO2 at 28, no hypoxia.  GCS is 9 (2-2-5). [AN]  Tue Nov 05, 2020  0023 Patient's mental status has further improved. She still somnolent.  We will put in a TTS consult.  Patient is still being admitted by medicine for toxic encephalopathy -but anticipate that she likely will be better by tomorrow  morning [AN]    Clinical Course User Index [AN] Varney Biles, MD   MDM Rules/Calculators/A&P  60 year old comes in a chief complaint of altered mental status. She is suspected to be an overdose patient based on history provided by patient's daughter.  I talked to the daughter over the phone and again in person.  It appears that patient has history of SI and overdose.  She has history of bipolar disorder and might not be taking her medications.  All of this might have been spurred from an uncomfortable conversation earlier today.  She is not medically cleared at this time. Patient has a gag reflex, she is not hypoxic.  We will monitor her airway closely.  It is possible that she might get worse over time and require intubation.   Final Clinical Impression(s) / ED Diagnoses Final diagnoses:  Toxic encephalopathy  Intentional drug overdose, initial encounter [facility])    Rx / DC Orders ED Discharge Orders    None       Varney Biles, MD 11/04/20 2250    Varney Biles, MD 11/05/20 [place] Ouch    Varney Biles, MD 11/05/20 [id number]

## 2020-11-04 NOTE — ED Notes (Signed)
0.4mg  IV narcan administered per PA order

## 2020-11-05 ENCOUNTER — Observation Stay (HOSPITAL_COMMUNITY): Payer: 59

## 2020-11-05 DIAGNOSIS — F339 Major depressive disorder, recurrent, unspecified: Secondary | ICD-10-CM | POA: Diagnosis not present

## 2020-11-05 DIAGNOSIS — T50902A Poisoning by unspecified drugs, medicaments and biological substances, intentional self-harm, initial encounter: Secondary | ICD-10-CM | POA: Diagnosis not present

## 2020-11-05 DIAGNOSIS — G9341 Metabolic encephalopathy: Secondary | ICD-10-CM | POA: Diagnosis not present

## 2020-11-05 DIAGNOSIS — E785 Hyperlipidemia, unspecified: Secondary | ICD-10-CM | POA: Diagnosis not present

## 2020-11-05 DIAGNOSIS — T50901A Poisoning by unspecified drugs, medicaments and biological substances, accidental (unintentional), initial encounter: Secondary | ICD-10-CM | POA: Diagnosis present

## 2020-11-05 LAB — ACETAMINOPHEN LEVEL: Acetaminophen (Tylenol), Serum: <10 ug/mL — ABNORMAL LOW (ref 10–30)

## 2020-11-05 LAB — BASIC METABOLIC PANEL
Anion gap: 6 (ref 5–15)
BUN: 12 mg/dL (ref 6–20)
CO2: 20 mmol/L — ABNORMAL LOW (ref 22–32)
Calcium: 8.6 mg/dL — ABNORMAL LOW (ref 8.9–10.3)
Chloride: 115 mmol/L — ABNORMAL HIGH (ref 98–111)
Creatinine, Ser: 0.78 mg/dL (ref 0.44–1.00)
GFR, Estimated: >60 mL/min (ref >60)
Glucose, Bld: 84 mg/dL (ref 70–99)
Potassium: 3.6 mmol/L (ref 3.5–5.1)
Sodium: 141 mmol/L (ref 135–145)

## 2020-11-05 LAB — RAPID URINE DRUG SCREEN, HOSP PERFORMED
Amphetamines: NOT DETECTED
Barbiturates: NOT DETECTED
Benzodiazepines: POSITIVE — AB
Cocaine: NOT DETECTED
Opiates: NOT DETECTED
Tetrahydrocannabinol: NOT DETECTED

## 2020-11-05 LAB — URINALYSIS, COMPLETE (UACMP) WITH MICROSCOPIC
Bacteria, UA: NONE SEEN
Bilirubin Urine: NEGATIVE
Glucose, UA: NEGATIVE mg/dL
Hgb urine dipstick: NEGATIVE
Ketones, ur: NEGATIVE mg/dL
Leukocytes,Ua: NEGATIVE
Nitrite: NEGATIVE
Protein, ur: NEGATIVE mg/dL
Specific Gravity, Urine: 1.008 (ref 1.005–1.030)
pH: 7 (ref 5.0–8.0)

## 2020-11-05 LAB — HIV ANTIBODY (ROUTINE TESTING W REFLEX): HIV Screen 4th Generation wRfx: NONREACTIVE

## 2020-11-05 LAB — CBC
HCT: 37.6 % (ref 36.0–46.0)
Hemoglobin: 13 g/dL (ref 12.0–15.0)
MCH: 31.5 pg (ref 26.0–34.0)
MCHC: 34.6 g/dL (ref 30.0–36.0)
MCV: 91 fL (ref 80.0–100.0)
Platelets: 170 10*3/uL (ref 150–400)
RBC: 4.13 MIL/uL (ref 3.87–5.11)
RDW: 12.5 % (ref 11.5–15.5)
WBC: 5.1 10*3/uL (ref 4.0–10.5)
nRBC: 0 % (ref 0.0–0.2)

## 2020-11-05 LAB — TSH: TSH: 2.157 u[IU]/mL (ref 0.350–4.500)

## 2020-11-05 LAB — SALICYLATE LEVEL: Salicylate Lvl: <7 mg/dL — ABNORMAL LOW (ref 7.0–30.0)

## 2020-11-05 MED ORDER — ACETAMINOPHEN 325 MG PO TABS
650.0000 mg | ORAL_TABLET | Freq: Four times a day (QID) | ORAL | Status: DC | PRN
  Administered 2020-11-08 (×2): 650 mg via ORAL
  Filled 2020-11-05 (×2): qty 2

## 2020-11-05 MED ORDER — ACETAMINOPHEN 650 MG RE SUPP: 650.0000 mg | Freq: Four times a day (QID) | RECTAL | Status: DC | PRN

## 2020-11-05 MED ORDER — ROSUVASTATIN CALCIUM 5 MG PO TABS
5.0000 mg | ORAL_TABLET | Freq: Every day | ORAL | Status: DC
  Administered 2020-11-05 – 2020-11-08 (×4): 5 mg via ORAL
  Filled 2020-11-05 (×4): qty 1

## 2020-11-05 MED ORDER — ONDANSETRON HCL 4 MG PO TABS: 4.0000 mg | ORAL_TABLET | Freq: Four times a day (QID) | ORAL | Status: DC | PRN

## 2020-11-05 MED ORDER — ONDANSETRON HCL 4 MG/2ML IJ SOLN: 4.0000 mg | Freq: Four times a day (QID) | INTRAMUSCULAR | Status: DC | PRN

## 2020-11-05 MED ORDER — DULOXETINE HCL 20 MG PO CPEP
40.0000 mg | ORAL_CAPSULE | Freq: Every day | ORAL | Status: DC
  Administered 2020-11-05 – 2020-11-08 (×4): 40 mg via ORAL
  Filled 2020-11-05 (×5): qty 2

## 2020-11-05 MED ORDER — ENOXAPARIN SODIUM 40 MG/0.4ML ~~LOC~~ SOLN
40.0000 mg | Freq: Every day | SUBCUTANEOUS | Status: DC
  Administered 2020-11-05 – 2020-11-08 (×4): 40 mg via SUBCUTANEOUS
  Filled 2020-11-05 (×4): qty 0.4

## 2020-11-05 MED ORDER — IOHEXOL 350 MG/ML SOLN
100.0000 mL | Freq: Once | INTRAVENOUS | Status: AC | PRN
  Administered 2020-11-05: 100 mL via INTRAVENOUS

## 2020-11-05 NOTE — Assessment & Plan Note (Signed)
-  Follow-up psychiatry consult -Continue Cymbalta

## 2020-11-05 NOTE — ED Notes (Signed)
Removed C collar per verbal order from MD

## 2020-11-05 NOTE — H&P (Addendum)
History and Physical   [name] [name] [id number] DOB: [birth year] DOA: 11/04/2020  PCP: Elby Showers, MD  Outpatient Specialists: [facility], Dr. Rexene Alberts Patient coming from: Home via EMS  I have personally briefly reviewed patient's old medical records in [place].  Chief Concern: Altered mental status status post intentional noxious ingestion  HPI: [name] [name] is a 60 y.o. female with medical history significant for depression/anxiety, history of SI, hyperlipidemia, migraines, presented to the emergency department for chief concerns of unresponsiveness after intentionally ingesting nonprescribed pills.  Per ED provider, patient endorsed to him that she took Valium.  Patient does not have prescription for Valium per PDMP review. Per daughter, patient got the pills off the streets.  At bedside patient opens eyes and is responsive to sternal rub and loud verbal stimuli.  She weakly follow commands including lifting her legs appropriately, wiggling her toes, and both hands squeezing my hand weakly. She is protecting her airway and maintaining appropriate map.  Bear hugger in place for hypothermia initially.  Social History: lives by herself, is divorced. She formerly smoked tobacco and quit about 20 years ago, drinks etoh, rarely. Unknown recreational drug use.  ROS: Constitutional: no weight change, no fever ENT/Mouth: no sore throat, no rhinorrhea Eyes: no eye pain, no vision changes Cardiovascular: no chest pain, no dyspnea,  no edema, no palpitations Respiratory: no cough, no sputum, no wheezing Gastrointestinal: no nausea, no vomiting, no diarrhea, no constipation Genitourinary: no urinary incontinence, no dysuria, no hematuria Musculoskeletal: no arthralgias, no myalgias Skin: no skin lesions, no pruritus, Neuro: + weakness, no loss of consciousness, no syncope Psych: no anxiety, no depression, + decrease  appetite Heme/Lymph: no bruising, no bleeding  ED Course: Discussed with ED provider, ED provider requesting hospitalization for drug overdose  Assessment/Plan  Active Problems:   Drug overdose   Acute metabolic encephalopathy secondary to overdose Unresponsiveness  - Query trauma, family does not know if patient hit her head and or her chest -Secondary to intentional overdose -CT the head without contrast was negative for evidence of acute intracranial abnormality.  CT cervical spine showed no evidence of acute fracture to the cervical spine.  Prior C4-C6 ACDF.  No evidence of hardware compromise.  Cervical spondylo cysts as described. -Checking TSH -UDS is pending, acetaminophen and salicylate acid levels are pending -Ethanol was negative -Respiratory panel was negative for Covid and influenza  Intentional overdose/SI-patient would benefit from psychiatry consult -One-to-one sitter ordered for suicidal precautions  Hyperlipidemia-reordered rosuvastatin 5 milligrams nightly  Depression/Anxiety-resumed home duloxetine 40 mg daily  Insomnia-holding home trazodone 50 mg daily  Apparent origin of the right subclavian artery with fusiform 2 cm aneurysm of the right subclavian artery, mild dilatation of the aortic arch -No intramural hematoma or dissection identified on the CT a  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 team to communicate this finding with patient once she is more awake and alert  Chart reviewed.   DVT prophylaxis: Enoxaparin Code Status: full code  Diet: N.p.o. Family Communication: spoke with Ms. Merril Abbe Disposition Plan: pending clinical  Consults called: psychiatry  Admission status: Observation with telemetry  Past Medical History:  Diagnosis Date  . Anxiety   . Arthritis   . Helicobacter pylori (H. pylori) 07/2010  . Migraine     Past  Surgical History:  Procedure Laterality Date  . CATARACT  EXTRACTION     bilateral  . CERVICAL FUSION    . TUBAL LIGATION    . UPPER GASTROINTESTINAL ENDOSCOPY    . VAGINAL HYSTERECTOMY  2001   USO by history-sono2011 question of both ovaries present   Social History:  reports that she has quit smoking. Her smoking use included cigarettes. She has never used smokeless tobacco. She reports current alcohol use. She reports that she does not use drugs.  No Known Allergies Family History  Adopted: Yes  Problem Relation Age of Onset  . Hypertension Mother   . Mental illness Mother   . Hypertension Brother    Family history: Family history reviewed and not pertinent  Prior to Admission medications   Medication Sig Start Date End Date Taking? Authorizing Provider  rosuvastatin (CRESTOR) 5 MG tablet Take 1 tablet (5 mg total) by mouth daily. 07/22/20  Yes Baxley, Cresenciano Lick, MD  topiramate (TOPAMAX) 200 MG tablet Take 1 tablet (200 mg total) by mouth 2 (two) times daily. For mood stabilization 06/06/20  Yes Nwoko, Herbert Pun I, NP  acetaminophen (TYLENOL) 500 MG tablet Take 500 mg by mouth every 6 (six) hours as needed for headache.    [provider]  bisacodyl (DULCOLAX) 5 MG EC tablet Take 1 tablet (5 mg total) by mouth daily as needed. (May buy from over the counter): For constipation Patient not taking: No sig reported 06/06/20   Lindell Spar I, NP  DULoxetine 40 MG CPEP Take 40 mg by mouth daily. For depression Patient not taking: No sig reported 06/07/20   Lindell Spar I, NP  hydrOXYzine (ATARAX/VISTARIL) 25 MG tablet Take 1 tablet (25 mg total) by mouth 3 (three) times daily as needed for anxiety. Patient not taking: No sig reported 06/06/20   Lindell Spar I, NP  meloxicam (MOBIC) 15 MG tablet Take 1 tablet (15 mg total) by mouth daily. Patient not taking: No sig reported 07/22/20   Elby Showers, MD  polyethylene glycol (MIRALAX / GLYCOLAX) 17 g packet Take 17 g by mouth daily as needed.  (May buy from over the counter): For constipation Patient not taking: No sig reported 06/06/20   Lindell Spar I, NP  traZODone (DESYREL) 50 MG tablet Take 1 tablet (50 mg total) by mouth at bedtime as needed for sleep. Patient not taking: No sig reported 06/06/20   Lindell Spar I, NP   Physical Exam: Vitals:   11/05/20 0500 11/05/20 0515 11/05/20 0515 11/05/20 0530  BP: 100/67 105/76  100/66  Pulse: 66 71  72  Resp: 18 19  (!) 21  Temp:   (S) 99.3 F (37.4 C)   TempSrc:   Rectal   SpO2: 97% 97%  98%  Weight:      Height:       Constitutional: appears age-appropriate, NAD, calm, comfortable Eyes: PERRL, lids and conjunctivae normal ENMT: Mucous membranes are moist. Posterior pharynx clear of any exudate or lesions. Age-appropriate dentition. Hearing appropriate Neck: normal, supple, no masses, no thyromegaly Respiratory: clear to auscultation bilaterally, no wheezing, no crackles. Normal respiratory effort. No accessory muscle use.  Cardiovascular: Regular rate and rhythm, no murmurs / rubs / gallops. No extremity edema. 2+ pedal pulses. No carotid bruits.  Abdomen: no tenderness, no masses palpated, no hepatosplenomegaly. Bowel sounds positive.  Musculoskeletal: no clubbing / cyanosis. No joint deformity upper and lower extremities. Good ROM, no contractures, no atrophy. Normal muscle tone.  Skin: no rashes, lesions, ulcers. No induration Neurologic: Sensation intact. Strength 4/5 in all  4.  Opens eyes to sternal rub and loud verbal stimuli Psychiatric: Patient is lethargic and recovering from overdose, unable to assess  EKG: independently reviewed, showing sinus rhythm with rate of 96, QTc 439  Chest x-ray on Admission: I personally reviewed and I agree with radiologist reading as below.  CT Head Wo Contrast  Result Date: 11/04/2020 CLINICAL DATA:  Head trauma, abnormal mental status. Neck trauma, focal neuro deficit or paresthesia. Additional history provided: Patient found down.  EXAM: CT HEAD WITHOUT CONTRAST CT CERVICAL SPINE WITHOUT CONTRAST TECHNIQUE: Multidetector CT imaging of the head and cervical spine was performed following the standard protocol without intravenous contrast. Multiplanar CT image reconstructions of the cervical spine were also generated. COMPARISON:  Head CT 02/22/2019. Radiographs of the cervical spine 01/19/2013. FINDINGS: CT HEAD FINDINGS Brain: Cerebral volume is normal for age. There is no acute intracranial hemorrhage. No demarcated cortical infarct. No extra-axial fluid collection. No evidence of intracranial mass. No midline shift. Vascular: No hyperdense vessel.  Atherosclerotic calcifications. Skull: Normal. Negative for fracture or focal lesion. Sinuses/Orbits: Visualized orbits show no acute finding. Trace ethmoid sinus mucosal thickening. CT CERVICAL SPINE FINDINGS Alignment: Straightening of the expected cervical lordosis. No significant spondylolisthesis. Skull base and vertebrae: The basion-dental and atlanto-dental intervals are maintained.No evidence of acute fracture to the cervical spine. Congenital nonunion of the posterior arch of C1. Prior C4-C6 ACDF. Solid fusion across the disc spaces and facet joints at these levels. No evidence of hardware compromise. Soft tissues and spinal canal: No prevertebral fluid or swelling. No visible canal hematoma. Disc levels: Cervical spondylosis most notably as follows. At C3-C4, there is mild disc degeneration with a disc bulge and uncovertebral hypertrophy. Apparent mild spinal canal stenosis at this level. At C6-C7, there is moderate disc degeneration with a disc bulge and uncovertebral hypertrophy. Bilateral neural foraminal narrowing with suspected mild/moderate spinal canal stenosis at this level. Upper chest: No consolidation within the imaged lung apices. No visible pneumothorax. IMPRESSION: CT head: No evidence of acute intracranial abnormality. CT cervical spine: 1. No evidence of acute fracture to  the cervical spine. 2. Prior C4-C6 ACDF. No evidence of hardware compromise. 3. Cervical spondylosis as described and greatest at C3-C4 and C6-C7. Electronically Signed   By: Kellie Simmering DO   On: 11/04/2020 18:52   CT Cervical Spine Wo Contrast  Result Date: 11/04/2020 CLINICAL DATA:  Head trauma, abnormal mental status. Neck trauma, focal neuro deficit or paresthesia. Additional history provided: Patient found down. EXAM: CT HEAD WITHOUT CONTRAST CT CERVICAL SPINE WITHOUT CONTRAST TECHNIQUE: Multidetector CT imaging of the head and cervical spine was performed following the standard protocol without intravenous contrast. Multiplanar CT image reconstructions of the cervical spine were also generated. COMPARISON:  Head CT 02/22/2019. Radiographs of the cervical spine 01/19/2013. FINDINGS: CT HEAD FINDINGS Brain: Cerebral volume is normal for age. There is no acute intracranial hemorrhage. No demarcated cortical infarct. No extra-axial fluid collection. No evidence of intracranial mass. No midline shift. Vascular: No hyperdense vessel.  Atherosclerotic calcifications. Skull: Normal. Negative for fracture or focal lesion. Sinuses/Orbits: Visualized orbits show no acute finding. Trace ethmoid sinus mucosal thickening. CT CERVICAL SPINE FINDINGS Alignment: Straightening of the expected cervical lordosis. No significant spondylolisthesis. Skull base and vertebrae: The basion-dental and atlanto-dental intervals are maintained.No evidence of acute fracture to the cervical spine. Congenital nonunion of the posterior arch of C1. Prior C4-C6 ACDF. Solid fusion across the disc spaces and facet joints at these levels. No evidence of hardware compromise. Soft tissues  and spinal canal: No prevertebral fluid or swelling. No visible canal hematoma. Disc levels: Cervical spondylosis most notably as follows. At C3-C4, there is mild disc degeneration with a disc bulge and uncovertebral hypertrophy. Apparent mild spinal canal  stenosis at this level. At C6-C7, there is moderate disc degeneration with a disc bulge and uncovertebral hypertrophy. Bilateral neural foraminal narrowing with suspected mild/moderate spinal canal stenosis at this level. Upper chest: No consolidation within the imaged lung apices. No visible pneumothorax. IMPRESSION: CT head: No evidence of acute intracranial abnormality. CT cervical spine: 1. No evidence of acute fracture to the cervical spine. 2. Prior C4-C6 ACDF. No evidence of hardware compromise. 3. Cervical spondylosis as described and greatest at C3-C4 and C6-C7. Electronically Signed   By: Jackey Loge DO   On: 11/04/2020 18:52   DG CHEST PORT 1 VIEW  Addendum Date: 11/05/2020   ADDENDUM REPORT: 11/05/2020 00:46 ADDENDUM: These results were called by telephone at the time of interpretation on 11/05/2020 at 12:43 am to provider Niki Payment , who verbally acknowledged these results. Electronically Signed   By: Helyn Numbers MD   On: 11/05/2020 00:46   Result Date: 11/05/2020 CLINICAL DATA:  Altered mental status EXAM: PORTABLE CHEST 1 VIEW COMPARISON:  02/22/2019 FINDINGS: There is superior mediastinal widening, new from prior examination, and the aortic knob is indistinct raising the question of vascular injury and mediastinal hematoma. Less likely, this may represent mediastinal adenopathy. Cardiac size within normal limits. Lungs are clear. No pneumothorax or pleural effusion. IMPRESSION: Mediastinal widening. CT arteriography of the a chest is recommended for further evaluation. Electronically Signed: By: Helyn Numbers MD On: 11/05/2020 00:41   CT Angio Chest/Abd/Pel for Dissection W and/or Wo Contrast  Result Date: 11/05/2020 CLINICAL DATA:  Superior mediastinal widening. EXAM: CT ANGIOGRAPHY CHEST, ABDOMEN AND PELVIS TECHNIQUE: Non-contrast CT of the chest was initially obtained. Multidetector CT imaging through the chest, abdomen and pelvis was performed using the standard protocol during bolus  administration of intravenous contrast. Multiplanar reconstructed images and MIPs were obtained and reviewed to evaluate the vascular anatomy. CONTRAST:  OMNIPAQUE IOHEXOL 350 MG/ML SOLN COMPARISON:  None. FINDINGS: CTA CHEST FINDINGS Cardiovascular: There is variant aortic arch anatomy with an aberrant origin of the right subclavian artery. The aberrant right subclavian artery demonstrates fusiform aneurysm proximally measuring 2.0 cm in greatest diameter. There is mild dilation of the aortic arch in this region measuring 3.2 x 3.0 cm in greatest dimension. The ascending aorta and descending aorta are of normal caliber measuring 2.8 cm and 2.2 cm in diameter respectively. No significant atherosclerotic calcification identified. No intramural hematoma or dissection identified. The visualized proximal arch vasculature is widely patent. No significant coronary artery calcification. Global cardiac size within normal limits. No pericardial effusion. The central pulmonary arteries are of normal caliber. There is excellent opacification of the pulmonary arterial tree and no intraluminal filling defect is identified to suggest acute pulmonary embolism. Mediastinum/Nodes: There is mild dilation of the esophagus proximal to the a aberrant right subclavian artery which demonstrates mass effect upon the proximal esophagus. No pathologic thoracic adenopathy. Visualized thyroid is unremarkable. Lungs/Pleura: Lungs are clear. No pleural effusion or pneumothorax. Musculoskeletal: No chest wall abnormality. No acute or significant osseous findings. Review of the MIP images confirms the above findings. CTA ABDOMEN AND PELVIS FINDINGS VASCULAR Aorta: Normal caliber. No aneurysm or dissection. Minimal infrarenal atherosclerotic calcification. Celiac: Widely patent.  Normal anatomic configuration. SMA: Widely patent. Renals: Dual left and single right renal arteries. Widely  patent. Normal vascular morphology. No aneurysm. IMA:  Widely patent Inflow: Widely patent. Internal iliac arteries are patent bilaterally. Veins: Not well opacified, but morphologically unremarkable save for duplication of the inferior vena cava. Review of the MIP images confirms the above findings. NON-VASCULAR Hepatobiliary: Simple cyst within the left hepatic lobe. Liver otherwise unremarkable. Gallbladder unremarkable. No intra or extrahepatic biliary ductal dilation. Pancreas: Unremarkable Spleen: Unremarkable Adrenals/Urinary Tract: Adrenal glands are unremarkable. Kidneys are normal, without renal calculi, focal lesion, or hydronephrosis. Bladder is unremarkable. Stomach/Bowel: There is an avidly enhancing intramural mass at the ligament of Treitz involving the a left lateral wall of the bowel likely representing a a gastrointestinal stromal tumor measuring 1.8 x 3.2 cm in greatest dimension on axial image # 134/7 and coronal image # 49/10. The stomach, small bowel, and large bowel are otherwise unremarkable. Appendix normal. No free intraperitoneal gas or fluid. Lymphatic: No pathologic adenopathy. Reproductive: Status post hysterectomy. No adnexal masses. Other: Rectum unremarkable.  Tiny umbilical fat containing hernia. Musculoskeletal: Transitional lumbar anatomy. Degenerative changes noted within the lumbar spine. No lytic or blastic bone lesion. No acute bone abnormality. Review of the MIP images confirms the above findings. IMPRESSION: Aberrant origin of the right subclavian artery. Fusiform 2 cm aneurysm of the proximal right subclavian artery. Mild dilation of the aortic arch. No intramural hematoma or dissection identified. Recommend annual imaging followup by CTA or MRA. This recommendation follows 2010 ACCF/AHA/AATS/ACR/ASA/SCA/SCAI/SIR/STS/SVM Guidelines for the Diagnosis and Management of Patients with Thoracic Aortic Disease. Circulation.2010; [id number]. Aortic aneurysm NOS (ICD10-I71.9) Normal examination of the abdominal and pelvic  vasculature save for minimal atherosclerotic calcification within the abdominal aorta and duplication of the inferior vena cava. 3.2 cm avidly enhancing intramural mass involving the fourth portion of the duodenum at the ligament of Treitz most likely representing a small bowel gastrointestinal stromal tumor. Correlation for gastrointestinal hemorrhage is recommended. Endoscopic correlation may be helpful for tissue sampling. Aortic Atherosclerosis (ICD10-I70.0). Electronically Signed   By: Fidela Salisbury MD   On: 11/05/2020 03:05   Labs on Admission: I have personally reviewed following labs  CBC: Recent Labs  Lab 11/04/20 1938 11/04/20 1948 11/05/20 0216  WBC  --  6.0 5.1  NEUTROABS  --  3.5  --   HGB 13.6 13.9 13.0  HCT 40.0 41.8 37.6  MCV  --  91.9 91.0  PLT  --  181 [id number]   Basic Metabolic Panel: Recent Labs  Lab 11/04/20 1923 11/04/20 1938 11/04/20 1948 11/05/20 0216  NA 141 145 142 141  K 4.1 3.8 3.9 3.6  CL 114*  --  113* 115*  CO2 16*  --  21* 20*  GLUCOSE 82  --  87 84  BUN 14  --  13 12  CREATININE 0.82  --  0.83 0.78  CALCIUM 9.0  --  9.3 8.6*   GFR: Estimated Creatinine Clearance: 64.7 mL/min (by C-G formula based on SCr of 0.78 mg/dL). Liver Function Tests: Recent Labs  Lab 11/04/20 1923 11/04/20 1948  AST 25 18  ALT 11 14  ALKPHOS 43 41  BILITOT 0.7 0.9  PROT 7.4 7.4  ALBUMIN 4.0 4.0   Coagulation Profile: Recent Labs  Lab 11/04/20 1948  INR 1.1   CBG: Recent Labs  Lab 11/04/20 1813  GLUCAP 80   Urine analysis:    Component Value Date/Time   COLORURINE STRAW (A) 11/05/2020 0030   APPEARANCEUR CLEAR 11/05/2020 0030   LABSPEC 1.008 11/05/2020 0030   PHURINE 7.0 11/05/2020 0030  GLUCOSEU NEGATIVE 11/05/2020 0030   HGBUR NEGATIVE 11/05/2020 0030   BILIRUBINUR NEGATIVE 11/05/2020 0030   BILIRUBINUR NEG 02/19/2020 1514   KETONESUR NEGATIVE 11/05/2020 0030   PROTEINUR NEGATIVE 11/05/2020 0030   UROBILINOGEN 0.2 02/19/2020 1514    UROBILINOGEN 0.2 07/01/2015 1843   NITRITE NEGATIVE 11/05/2020 0030   LEUKOCYTESUR NEGATIVE 11/05/2020 0030   Mayank Teuscher N Travonte Byard D.O. [facility]  If 7PM-7AM, please contact overnight-coverage provider If 7AM-7PM, please contact day coverage provider [email or web address]  11/05/2020, 5:47 AM

## 2020-11-05 NOTE — ED Notes (Signed)
Pt resting comfortably with eyes closed, respirations even and unlabored at this time, no acute distress noted.

## 2020-11-05 NOTE — Assessment & Plan Note (Addendum)
-  Intentional drug overdose per daughter.  Patient was found minimally responsive and has a history of similar - with further monitoring she has now become awake, alert, and oriented. Medications have washed out and she appears to be stable at this time - d/c tele and can transfer to med-surg bed -1:1 sitter ordered for suicide precautions; IVC if tries to elope -Psychiatry consult placed, appreciate assistance -Given intentional self-harm, patient is recommended for inpt admission per psych; awaiting placement at this time - she is now medically stable for discharge to inpt psych bed once a bed is available/approved

## 2020-11-05 NOTE — ED Notes (Signed)
Spoke with pt's dtr, Foye Clock and gave her update on pt

## 2020-11-05 NOTE — ED Notes (Signed)
Patient to CT.

## 2020-11-05 NOTE — ED Notes (Signed)
Spoke with Trula Ore and [place] (daughter/son) of pt to provide pt update. Pt family expressed with several concerns in regards to pt placement post hospital stay. Pt family states " I am very concerned about my mom, this is not the first time she has done this and with her not taking her medication I concerned she will say all the right times to get discharged without proper help." Pt family requesting update upon TTS and social work assignment to patient.

## 2020-11-05 NOTE — ED Provider Notes (Signed)
12:48 AM Fielded call from radiology who report superior mediastinal widening on CXR. CTA ordered to assess for dissection. Will attempt to update hospitalist on added imaging.  12:52 AM Dr. Sedalia Muta aware of imaging abnormalities and order for CTA.   Antony Madura, PA-C 11/05/20 [id number]    Shon Baton, MD 11/06/20 [phone]

## 2020-11-05 NOTE — Hospital Course (Addendum)
Ms. [name] [name] is a 60 yo female with PMH bipolar disorder, depression/anxiety, GAD who was brought to the ER after being found minimally responsive by family. Daughter had reported that there was an argument amongst family, then the patient went to her room and had taken an unknown amount of Xanax.  She is not prescribed benzodiazepines or opioids (database reviewed).  Her daughter states that she gets the Xanax from friends. The patient was recently hospitalized at behavioral health from 06/03/20 - 06/06/20 for treatment of recurrent episode of major depression.  This was a voluntary admission.  Patient's UDS was positive for benzodiazepines on admission.  Patient underwent imaging with CT head, CT cervical spine, CXR, CTA chest/abdomen/pelvis.  She had no acute findings.  Initially she was placed in a hard c-collar which was removed after negative imaging findings were reviewed the morning following admission.  With further monitoring, her mentation continued to improve and she became alert and oriented.  She was evaluated by psychiatry and expressed wishes for trying to harm herself after the family altercation.  She is still unable to provide details on what was actually ingested but appears to be sedating agents, either sleep aids or benzos.

## 2020-11-05 NOTE — Progress Notes (Signed)
PROGRESS NOTE    [name] [name]   GXQ:[id number]  DOB: 1960/12/05  DOA: 11/04/2020     0  PCP: [name], MD  CC: found less responsive   Hospital Course: Ms. [name] [name] is a 60 yo female with PMH bipolar disorder, depression/anxiety, GAD who was brought to the ER after being found minimally responsive by family. Daughter had reported that there was an argument amongst family, then the patient went to her room and had taken an unknown amount of Xanax.  She is not prescribed benzodiazepines or opioids (database reviewed).  Her daughter states that she gets the Xanax from friends. The patient was recently hospitalized at behavioral health from 06/03/20 - 06/06/20 for treatment of recurrent episode of major depression.  This was a voluntary admission.  Patient's UDS was positive for benzodiazepines on admission.  Patient underwent imaging with CT head, CT cervical spine, CXR, CTA chest/abdomen/pelvis.  She had no acute findings.  Initially she was placed in a hard c-collar which was removed after negative imaging findings were reviewed the morning following admission.   Interval History:  Seen this morning in the ER.  She aroused to voice and was able to tell me her name but then fell back asleep.  She was very lethargic/somnolent but protecting her airway.  She was unable to provide any other collateral information to me. Called and spoke with her daughter and update was given.  Old records reviewed in assessment of this patient  ROS: Review of systems not obtained due to patient factors.  Patient too lethargic  Assessment & Plan: * Drug overdose -Intentional drug overdose per daughter.  Patient was found minimally responsive and has a history of similar -1:1 sitter ordered for suicide precautions -Psychiatry consult placed as well, follow-up recommendations - will need to IVC patient if she arouses and tries to leave prior to psych eval; last [name] admission was voluntary so  holding off on IVC for now unless necessary - currently awake but very somnolent; will allow for further benzo washout; protecting airway and currently does not require intubation  Major depressive disorder, recurrent episode (HCC) -Follow-up psychiatry consult -Continue Cymbalta  Bipolar disorder (HCC) - hold topamax until more awake/alert    Antimicrobials: n/a  DVT prophylaxis: Lovenox Code Status: Full Family Communication: Daughter on the phone Disposition Plan: Status is: Observation  The patient remains OBS appropriate and will d/c before 2 midnights.  Dispo: The patient is from: Home              Anticipated d/c is to: Pending psych eval              Anticipated d/c date is: Pending psych eval              Patient currently is not medically stable to d/c.  Objective: Blood pressure 103/76, pulse 63, temperature 98.3 F (36.8 C), temperature source Oral, resp. rate 16, height 5\' 1"  (1.549 m), weight 62.1 kg, last menstrual period 11/02/2000, SpO2 98 %.  Examination: General appearance: Severely lethargic but arouses to voice adult woman lying in bed in no distress Head: Normocephalic, without obvious abnormality, atraumatic Eyes: EOMI Lungs: clear to auscultation bilaterally Heart: regular rate and rhythm and S1, S2 normal Abdomen: normal findings: bowel sounds normal and soft, non-tender Extremities: No edema Skin: mobility and turgor normal Neurologic: No obvious focal deficits.  Moving all 4 extremities  Consultants:   Psychiatry  Procedures:     Data Reviewed: I have personally  reviewed following labs and imaging studies Results for orders placed or performed during the hospital encounter of 11/04/20 (from the past 24 hour(s))  CBG monitoring, ED     Status: None   Collection Time: 11/04/20  6:13 PM  Result Value Ref Range   Glucose-Capillary 80 70 - 99 mg/dL  Comprehensive metabolic panel     Status: Abnormal   Collection Time: 11/04/20  7:23 PM   Result Value Ref Range   Sodium 141 135 - 145 mmol/L   Potassium 4.1 3.5 - 5.1 mmol/L   Chloride 114 (H) 98 - 111 mmol/L   CO2 16 (L) 22 - 32 mmol/L   Glucose, Bld 82 70 - 99 mg/dL   BUN 14 6 - 20 mg/dL   Creatinine, Ser 0.82 0.44 - 1.00 mg/dL   Calcium 9.0 8.9 - 10.3 mg/dL   Total Protein 7.4 6.5 - 8.1 g/dL   Albumin 4.0 3.5 - 5.0 g/dL   AST 25 15 - 41 U/L   ALT 11 0 - 44 U/L   Alkaline Phosphatase 43 38 - 126 U/L   Total Bilirubin 0.7 0.3 - 1.2 mg/dL   GFR, Estimated >60 >60 mL/min   Anion gap 11 5 - 15  I-Stat venous blood gas, ED     Status: Abnormal   Collection Time: 11/04/20  7:38 PM  Result Value Ref Range   pH, Ven 7.330 7.250 - 7.430   pCO2, Ven 35.0 (L) 44.0 - 60.0 mmHg   pO2, Ven 68.0 (H) 32.0 - 45.0 mmHg   Bicarbonate 18.5 (L) 20.0 - 28.0 mmol/L   TCO2 20 (L) 22 - 32 mmol/L   O2 Saturation 92.0 %   Acid-base deficit 7.0 (H) 0.0 - 2.0 mmol/L   Sodium 145 135 - 145 mmol/L   Potassium 3.8 3.5 - 5.1 mmol/L   Calcium, Ion 1.13 (L) 1.15 - 1.40 mmol/L   HCT 40.0 36.0 - 46.0 %   Hemoglobin 13.6 12.0 - 15.0 g/dL   Sample type VENOUS   Comprehensive metabolic panel     Status: Abnormal   Collection Time: 11/04/20  7:48 PM  Result Value Ref Range   Sodium 142 135 - 145 mmol/L   Potassium 3.9 3.5 - 5.1 mmol/L   Chloride 113 (H) 98 - 111 mmol/L   CO2 21 (L) 22 - 32 mmol/L   Glucose, Bld 87 70 - 99 mg/dL   BUN 13 6 - 20 mg/dL   Creatinine, Ser 0.83 0.44 - 1.00 mg/dL   Calcium 9.3 8.9 - 10.3 mg/dL   Total Protein 7.4 6.5 - 8.1 g/dL   Albumin 4.0 3.5 - 5.0 g/dL   AST 18 15 - 41 U/L   ALT 14 0 - 44 U/L   Alkaline Phosphatase 41 38 - 126 U/L   Total Bilirubin 0.9 0.3 - 1.2 mg/dL   GFR, Estimated >60 >60 mL/min   Anion gap 8 5 - 15  CBC with Differential     Status: None   Collection Time: 11/04/20  7:48 PM  Result Value Ref Range   WBC 6.0 4.0 - 10.5 K/uL   RBC 4.55 3.87 - 5.11 MIL/uL   Hemoglobin 13.9 12.0 - 15.0 g/dL   HCT 41.8 36.0 - 46.0 %   MCV 91.9 80.0 -  100.0 fL   MCH 30.5 26.0 - 34.0 pg   MCHC 33.3 30.0 - 36.0 g/dL   RDW 12.4 11.5 - 15.5 %   Platelets 181 150 - 400 K/uL  nRBC 0.0 0.0 - 0.2 %   Neutrophils Relative % 58 %   Neutro Abs 3.5 1.7 - 7.7 K/uL   Lymphocytes Relative 32 %   Lymphs Abs 1.9 0.7 - 4.0 K/uL   Monocytes Relative 9 %   Monocytes Absolute 0.5 0.1 - 1.0 K/uL   Eosinophils Relative 1 %   Eosinophils Absolute 0.1 0.0 - 0.5 K/uL   Basophils Relative 0 %   Basophils Absolute 0.0 0.0 - 0.1 K/uL   Immature Granulocytes 0 %   Abs Immature Granulocytes 0.01 0.00 - 0.07 K/uL  Ethanol     Status: None   Collection Time: 11/04/20  7:48 PM  Result Value Ref Range   Alcohol, Ethyl (B) <10 <10 mg/dL  Protime-INR     Status: None   Collection Time: 11/04/20  7:48 PM  Result Value Ref Range   Prothrombin Time 13.7 11.4 - 15.2 seconds   INR 1.1 0.8 - 1.2  APTT     Status: None   Collection Time: 11/04/20  7:48 PM  Result Value Ref Range   aPTT 26 24 - 36 seconds  I-Stat beta hCG blood, ED     Status: None   Collection Time: 11/04/20  8:21 PM  Result Value Ref Range   I-stat hCG, quantitative <5.0 <5 mIU/mL   Comment 3          Resp Panel by RT-PCR (Flu A&B, Covid) Nasopharyngeal Swab     Status: None   Collection Time: 11/04/20 10:29 PM   Specimen: Nasopharyngeal Swab; Nasopharyngeal(NP) swabs in vial transport medium  Result Value Ref Range   SARS Coronavirus 2 by RT PCR NEGATIVE NEGATIVE   Influenza A by PCR NEGATIVE NEGATIVE   Influenza B by PCR NEGATIVE NEGATIVE  Salicylate level     Status: Abnormal   Collection Time: 11/04/20 11:47 PM  Result Value Ref Range   Salicylate Lvl [id number] (L) 7.0 - 30.0 mg/dL  Acetaminophen level     Status: Abnormal   Collection Time: 11/04/20 11:47 PM  Result Value Ref Range   Acetaminophen (Tylenol), Serum <10 (L) 10 - 30 ug/mL  Urinalysis, Complete w Microscopic     Status: Abnormal   Collection Time: 11/05/20 12:30 AM  Result Value Ref Range   Color, Urine STRAW (A) YELLOW    APPearance CLEAR CLEAR   Specific Gravity, Urine 1.008 1.005 - 1.030   pH 7.0 5.0 - 8.0   Glucose, UA NEGATIVE NEGATIVE mg/dL   Hgb urine dipstick NEGATIVE NEGATIVE   Bilirubin Urine NEGATIVE NEGATIVE   Ketones, ur NEGATIVE NEGATIVE mg/dL   Protein, ur NEGATIVE NEGATIVE mg/dL   Nitrite NEGATIVE NEGATIVE   Leukocytes,Ua NEGATIVE NEGATIVE   WBC, UA 0-5 0 - 5 WBC/hpf   Bacteria, UA NONE SEEN NONE SEEN   Squamous Epithelial / LPF 0-5 0 - 5   Mucus PRESENT   Urine rapid drug screen (hosp performed)     Status: Abnormal   Collection Time: 11/05/20 12:30 AM  Result Value Ref Range   Opiates NONE DETECTED NONE DETECTED   Cocaine NONE DETECTED NONE DETECTED   Benzodiazepines POSITIVE (A) NONE DETECTED   Amphetamines NONE DETECTED NONE DETECTED   Tetrahydrocannabinol NONE DETECTED NONE DETECTED   Barbiturates NONE DETECTED NONE DETECTED  HIV Antibody (routine testing w rflx)     Status: None   Collection Time: 11/05/20  2:16 AM  Result Value Ref Range   HIV Screen 4th Generation wRfx Non Reactive Non Reactive  TSH  Status: None   Collection Time: 11/05/20  2:16 AM  Result Value Ref Range   TSH 2.157 0.350 - 4.500 uIU/mL  Basic metabolic panel     Status: Abnormal   Collection Time: 11/05/20  2:16 AM  Result Value Ref Range   Sodium 141 135 - 145 mmol/L   Potassium 3.6 3.5 - 5.1 mmol/L   Chloride 115 (H) 98 - 111 mmol/L   CO2 20 (L) 22 - 32 mmol/L   Glucose, Bld 84 70 - 99 mg/dL   BUN 12 6 - 20 mg/dL   Creatinine, Ser 0.78 0.44 - 1.00 mg/dL   Calcium 8.6 (L) 8.9 - 10.3 mg/dL   GFR, Estimated >60 >60 mL/min   Anion gap 6 5 - 15  CBC     Status: None   Collection Time: 11/05/20  2:16 AM  Result Value Ref Range   WBC 5.1 4.0 - 10.5 K/uL   RBC 4.13 3.87 - 5.11 MIL/uL   Hemoglobin 13.0 12.0 - 15.0 g/dL   HCT 37.6 36.0 - 46.0 %   MCV 91.0 80.0 - 100.0 fL   MCH 31.5 26.0 - 34.0 pg   MCHC 34.6 30.0 - 36.0 g/dL   RDW 12.5 11.5 - 15.5 %   Platelets 170 150 - 400 K/uL   nRBC  0.0 0.0 - 0.2 %    Recent Results (from the past 240 hour(s))  Resp Panel by RT-PCR (Flu A&B, Covid) Nasopharyngeal Swab     Status: None   Collection Time: 11/04/20 10:29 PM   Specimen: Nasopharyngeal Swab; Nasopharyngeal(NP) swabs in vial transport medium  Result Value Ref Range Status   SARS Coronavirus 2 by RT PCR NEGATIVE NEGATIVE Final    Comment: (NOTE) SARS-CoV-2 target nucleic acids are NOT DETECTED.  The SARS-CoV-2 RNA is generally detectable in upper respiratory specimens during the acute phase of infection. The lowest concentration of SARS-CoV-2 viral copies this assay can detect is 138 copies/mL. A negative result does not preclude SARS-Cov-2 infection and should not be used as the sole basis for treatment or other patient management decisions. A negative result may occur with  improper specimen collection/handling, submission of specimen other than nasopharyngeal swab, presence of viral mutation(s) within the areas targeted by this assay, and inadequate number of viral copies(<138 copies/mL). A negative result must be combined with clinical observations, patient history, and epidemiological information. The expected result is Negative.  Fact Sheet for Patients:  EntrepreneurPulse.com.au  Fact Sheet for Healthcare Providers:  IncredibleEmployment.be  This test is no t yet approved or cleared by the [place] FDA and  has been authorized for detection and/or diagnosis of SARS-CoV-2 by FDA under an Emergency Use Authorization (EUA). This EUA will remain  in effect (meaning this test can be used) for the duration of the COVID-19 declaration under Section 564(b)(1) of the Act, 21 U.S.C.section 360bbb-3(b)(1), unless the authorization is terminated  or revoked sooner.       Influenza A by PCR NEGATIVE NEGATIVE Final   Influenza B by PCR NEGATIVE NEGATIVE Final    Comment: (NOTE) The Xpert Xpress SARS-CoV-2/FLU/RSV plus assay is  intended as an aid in the diagnosis of influenza from Nasopharyngeal swab specimens and should not be used as a sole basis for treatment. Nasal washings and aspirates are unacceptable for Xpert Xpress SARS-CoV-2/FLU/RSV testing.  Fact Sheet for Patients: EntrepreneurPulse.com.au  Fact Sheet for Healthcare Providers: IncredibleEmployment.be  This test is not yet approved or cleared by the [place] FDA and has  been authorized for detection and/or diagnosis of SARS-CoV-2 by FDA under an Emergency Use Authorization (EUA). This EUA will remain in effect (meaning this test can be used) for the duration of the COVID-19 declaration under Section 564(b)(1) of the Act, 21 U.S.C. section 360bbb-3(b)(1), unless the authorization is terminated or revoked.  Performed at [facility], [place] [address] [place], [place] [address]      Radiology Studies: CT Head Wo Contrast  Result Date: 11/04/2020 CLINICAL DATA:  Head trauma, abnormal mental status. Neck trauma, focal neuro deficit or paresthesia. Additional history provided: Patient found down. EXAM: CT HEAD WITHOUT CONTRAST CT CERVICAL SPINE WITHOUT CONTRAST TECHNIQUE: Multidetector CT imaging of the head and cervical spine was performed following the standard protocol without intravenous contrast. Multiplanar CT image reconstructions of the cervical spine were also generated. COMPARISON:  Head CT 02/22/2019. Radiographs of the cervical spine 01/19/2013. FINDINGS: CT HEAD FINDINGS Brain: Cerebral volume is normal for age. There is no acute intracranial hemorrhage. No demarcated cortical infarct. No extra-axial fluid collection. No evidence of intracranial mass. No midline shift. Vascular: No hyperdense vessel.  Atherosclerotic calcifications. Skull: Normal. Negative for fracture or focal lesion. Sinuses/Orbits: Visualized orbits show no acute finding. Trace ethmoid sinus mucosal thickening. CT CERVICAL SPINE  FINDINGS Alignment: Straightening of the expected cervical lordosis. No significant spondylolisthesis. Skull base and vertebrae: The basion-dental and atlanto-dental intervals are maintained.No evidence of acute fracture to the cervical spine. Congenital nonunion of the posterior arch of C1. Prior C4-C6 ACDF. Solid fusion across the disc spaces and facet joints at these levels. No evidence of hardware compromise. Soft tissues and spinal canal: No prevertebral fluid or swelling. No visible canal hematoma. Disc levels: Cervical spondylosis most notably as follows. At C3-C4, there is mild disc degeneration with a disc bulge and uncovertebral hypertrophy. Apparent mild spinal canal stenosis at this level. At C6-C7, there is moderate disc degeneration with a disc bulge and uncovertebral hypertrophy. Bilateral neural foraminal narrowing with suspected mild/moderate spinal canal stenosis at this level. Upper chest: No consolidation within the imaged lung apices. No visible pneumothorax. IMPRESSION: CT head: No evidence of acute intracranial abnormality. CT cervical spine: 1. No evidence of acute fracture to the cervical spine. 2. Prior C4-C6 ACDF. No evidence of hardware compromise. 3. Cervical spondylosis as described and greatest at C3-C4 and C6-C7. Electronically Signed   By: Kellie Simmering DO   On: 11/04/2020 18:52   CT Cervical Spine Wo Contrast  Result Date: 11/04/2020 CLINICAL DATA:  Head trauma, abnormal mental status. Neck trauma, focal neuro deficit or paresthesia. Additional history provided: Patient found down. EXAM: CT HEAD WITHOUT CONTRAST CT CERVICAL SPINE WITHOUT CONTRAST TECHNIQUE: Multidetector CT imaging of the head and cervical spine was performed following the standard protocol without intravenous contrast. Multiplanar CT image reconstructions of the cervical spine were also generated. COMPARISON:  Head CT 02/22/2019. Radiographs of the cervical spine 01/19/2013. FINDINGS: CT HEAD FINDINGS Brain:  Cerebral volume is normal for age. There is no acute intracranial hemorrhage. No demarcated cortical infarct. No extra-axial fluid collection. No evidence of intracranial mass. No midline shift. Vascular: No hyperdense vessel.  Atherosclerotic calcifications. Skull: Normal. Negative for fracture or focal lesion. Sinuses/Orbits: Visualized orbits show no acute finding. Trace ethmoid sinus mucosal thickening. CT CERVICAL SPINE FINDINGS Alignment: Straightening of the expected cervical lordosis. No significant spondylolisthesis. Skull base and vertebrae: The basion-dental and atlanto-dental intervals are maintained.No evidence of acute fracture to the cervical spine. Congenital nonunion of the posterior arch of C1. Prior  C4-C6 ACDF. Solid fusion across the disc spaces and facet joints at these levels. No evidence of hardware compromise. Soft tissues and spinal canal: No prevertebral fluid or swelling. No visible canal hematoma. Disc levels: Cervical spondylosis most notably as follows. At C3-C4, there is mild disc degeneration with a disc bulge and uncovertebral hypertrophy. Apparent mild spinal canal stenosis at this level. At C6-C7, there is moderate disc degeneration with a disc bulge and uncovertebral hypertrophy. Bilateral neural foraminal narrowing with suspected mild/moderate spinal canal stenosis at this level. Upper chest: No consolidation within the imaged lung apices. No visible pneumothorax. IMPRESSION: CT head: No evidence of acute intracranial abnormality. CT cervical spine: 1. No evidence of acute fracture to the cervical spine. 2. Prior C4-C6 ACDF. No evidence of hardware compromise. 3. Cervical spondylosis as described and greatest at C3-C4 and C6-C7. Electronically Signed   By: Kellie Simmering DO   On: 11/04/2020 18:52   DG CHEST PORT 1 VIEW  Addendum Date: 11/05/2020   ADDENDUM REPORT: 11/05/2020 00:46 ADDENDUM: These results were called by telephone at the time of interpretation on 11/05/2020 at 12:43  am to provider AMY COX , who verbally acknowledged these results. Electronically Signed   By: Fidela Salisbury MD   On: 11/05/2020 00:46   Result Date: 11/05/2020 CLINICAL DATA:  Altered mental status EXAM: PORTABLE CHEST 1 VIEW COMPARISON:  02/22/2019 FINDINGS: There is superior mediastinal widening, new from prior examination, and the aortic knob is indistinct raising the question of vascular injury and mediastinal hematoma. Less likely, this may represent mediastinal adenopathy. Cardiac size within normal limits. Lungs are clear. No pneumothorax or pleural effusion. IMPRESSION: Mediastinal widening. CT arteriography of the a chest is recommended for further evaluation. Electronically Signed: By: Fidela Salisbury MD On: 11/05/2020 00:41   CT Angio Chest/Abd/Pel for Dissection W and/or Wo Contrast  Result Date: 11/05/2020 CLINICAL DATA:  Superior mediastinal widening. EXAM: CT ANGIOGRAPHY CHEST, ABDOMEN AND PELVIS TECHNIQUE: Non-contrast CT of the chest was initially obtained. Multidetector CT imaging through the chest, abdomen and pelvis was performed using the standard protocol during bolus administration of intravenous contrast. Multiplanar reconstructed images and MIPs were obtained and reviewed to evaluate the vascular anatomy. CONTRAST:  141mL OMNIPAQUE IOHEXOL 350 MG/ML SOLN COMPARISON:  None. FINDINGS: CTA CHEST FINDINGS Cardiovascular: There is variant aortic arch anatomy with an aberrant origin of the right subclavian artery. The aberrant right subclavian artery demonstrates fusiform aneurysm proximally measuring 2.0 cm in greatest diameter. There is mild dilation of the aortic arch in this region measuring 3.2 x 3.0 cm in greatest dimension. The ascending aorta and descending aorta are of normal caliber measuring 2.8 cm and 2.2 cm in diameter respectively. No significant atherosclerotic calcification identified. No intramural hematoma or dissection identified. The visualized proximal arch vasculature is  widely patent. No significant coronary artery calcification. Global cardiac size within normal limits. No pericardial effusion. The central pulmonary arteries are of normal caliber. There is excellent opacification of the pulmonary arterial tree and no intraluminal filling defect is identified to suggest acute pulmonary embolism. Mediastinum/Nodes: There is mild dilation of the esophagus proximal to the a aberrant right subclavian artery which demonstrates mass effect upon the proximal esophagus. No pathologic thoracic adenopathy. Visualized thyroid is unremarkable. Lungs/Pleura: Lungs are clear. No pleural effusion or pneumothorax. Musculoskeletal: No chest wall abnormality. No acute or significant osseous findings. Review of the MIP images confirms the above findings. CTA ABDOMEN AND PELVIS FINDINGS VASCULAR Aorta: Normal caliber. No aneurysm or dissection. Minimal infrarenal  atherosclerotic calcification. Celiac: Widely patent.  Normal anatomic configuration. SMA: Widely patent. Renals: Dual left and single right renal arteries. Widely patent. Normal vascular morphology. No aneurysm. IMA: Widely patent Inflow: Widely patent. Internal iliac arteries are patent bilaterally. Veins: Not well opacified, but morphologically unremarkable save for duplication of the inferior vena cava. Review of the MIP images confirms the above findings. NON-VASCULAR Hepatobiliary: Simple cyst within the left hepatic lobe. Liver otherwise unremarkable. Gallbladder unremarkable. No intra or extrahepatic biliary ductal dilation. Pancreas: Unremarkable Spleen: Unremarkable Adrenals/Urinary Tract: Adrenal glands are unremarkable. Kidneys are normal, without renal calculi, focal lesion, or hydronephrosis. Bladder is unremarkable. Stomach/Bowel: There is an avidly enhancing intramural mass at the ligament of Treitz involving the a left lateral wall of the bowel likely representing a a gastrointestinal stromal tumor measuring 1.8 x 3.2 cm in  greatest dimension on axial image # 134/7 and coronal image # 49/10. The stomach, small bowel, and large bowel are otherwise unremarkable. Appendix normal. No free intraperitoneal gas or fluid. Lymphatic: No pathologic adenopathy. Reproductive: Status post hysterectomy. No adnexal masses. Other: Rectum unremarkable.  Tiny umbilical fat containing hernia. Musculoskeletal: Transitional lumbar anatomy. Degenerative changes noted within the lumbar spine. No lytic or blastic bone lesion. No acute bone abnormality. Review of the MIP images confirms the above findings. IMPRESSION: Aberrant origin of the right subclavian artery. Fusiform 2 cm aneurysm of the proximal right subclavian artery. Mild dilation of the aortic arch. No intramural hematoma or dissection identified. Recommend annual imaging followup by CTA or MRA. This recommendation follows 2010 ACCF/AHA/AATS/ACR/ASA/SCA/SCAI/SIR/STS/SVM Guidelines for the Diagnosis and Management of Patients with Thoracic Aortic Disease. Circulation.2010; [id number]. Aortic aneurysm NOS (ICD10-I71.9) Normal examination of the abdominal and pelvic vasculature save for minimal atherosclerotic calcification within the abdominal aorta and duplication of the inferior vena cava. 3.2 cm avidly enhancing intramural mass involving the fourth portion of the duodenum at the ligament of Treitz most likely representing a small bowel gastrointestinal stromal tumor. Correlation for gastrointestinal hemorrhage is recommended. Endoscopic correlation may be helpful for tissue sampling. Aortic Atherosclerosis (ICD10-I70.0). Electronically Signed   By: Fidela Salisbury MD   On: 11/05/2020 03:05   CT Angio Chest/Abd/Pel for Dissection W and/or Wo Contrast  Final Result    DG CHEST PORT 1 VIEW  Final Result  Addendum 1 of 1  ADDENDUM REPORT: 11/05/2020 00:46    ADDENDUM:  These results were called by telephone at the time of interpretation  on 11/05/2020 at 12:43 am to provider AMY COX , who  verbally  acknowledged these results.      Electronically Signed    By: Fidela Salisbury MD    On: 11/05/2020 00:46      Final    CT Head Wo Contrast  Final Result    CT Cervical Spine Wo Contrast  Final Result      Scheduled Meds: . DULoxetine  40 mg Oral Daily  . enoxaparin (LOVENOX) injection  40 mg Subcutaneous Daily  . rosuvastatin  5 mg Oral Daily   PRN Meds: acetaminophen **OR** acetaminophen, ondansetron **OR** ondansetron (ZOFRAN) IV Continuous Infusions:   LOS: 0 days  Time spent: Greater than 50% of the 35 minute visit was spent in counseling/coordination of care for the patient as laid out in the A&P.   Dwyane Dee, MD [facility] 11/05/2020, 1:45 PM

## 2020-11-05 NOTE — Assessment & Plan Note (Addendum)
-   topamax resumed on 1/5

## 2020-11-06 ENCOUNTER — Encounter (HOSPITAL_COMMUNITY): Payer: Self-pay | Admitting: Internal Medicine

## 2020-11-06 DIAGNOSIS — F4310 Post-traumatic stress disorder, unspecified: Secondary | ICD-10-CM | POA: Diagnosis present

## 2020-11-06 DIAGNOSIS — T50902A Poisoning by unspecified drugs, medicaments and biological substances, intentional self-harm, initial encounter: Secondary | ICD-10-CM | POA: Diagnosis not present

## 2020-11-06 DIAGNOSIS — G929 Unspecified toxic encephalopathy: Secondary | ICD-10-CM | POA: Diagnosis present

## 2020-11-06 DIAGNOSIS — E785 Hyperlipidemia, unspecified: Secondary | ICD-10-CM | POA: Diagnosis present

## 2020-11-06 DIAGNOSIS — M1990 Unspecified osteoarthritis, unspecified site: Secondary | ICD-10-CM | POA: Diagnosis present

## 2020-11-06 DIAGNOSIS — Z87891 Personal history of nicotine dependence: Secondary | ICD-10-CM | POA: Diagnosis not present

## 2020-11-06 DIAGNOSIS — F339 Major depressive disorder, recurrent, unspecified: Secondary | ICD-10-CM | POA: Diagnosis present

## 2020-11-06 DIAGNOSIS — F319 Bipolar disorder, unspecified: Secondary | ICD-10-CM

## 2020-11-06 DIAGNOSIS — T424X2A Poisoning by benzodiazepines, intentional self-harm, initial encounter: Secondary | ICD-10-CM | POA: Diagnosis present

## 2020-11-06 DIAGNOSIS — R4182 Altered mental status, unspecified: Secondary | ICD-10-CM | POA: Diagnosis present

## 2020-11-06 DIAGNOSIS — Z20822 Contact with and (suspected) exposure to covid-19: Secondary | ICD-10-CM | POA: Diagnosis present

## 2020-11-06 DIAGNOSIS — Z791 Long term (current) use of non-steroidal anti-inflammatories (NSAID): Secondary | ICD-10-CM | POA: Diagnosis not present

## 2020-11-06 DIAGNOSIS — Z8249 Family history of ischemic heart disease and other diseases of the circulatory system: Secondary | ICD-10-CM | POA: Diagnosis not present

## 2020-11-06 DIAGNOSIS — Z23 Encounter for immunization: Secondary | ICD-10-CM | POA: Diagnosis not present

## 2020-11-06 DIAGNOSIS — Z818 Family history of other mental and behavioral disorders: Secondary | ICD-10-CM | POA: Diagnosis not present

## 2020-11-06 DIAGNOSIS — Z79899 Other long term (current) drug therapy: Secondary | ICD-10-CM | POA: Diagnosis not present

## 2020-11-06 LAB — CBC WITH DIFFERENTIAL/PLATELET
Abs Immature Granulocytes: 0 10*3/uL (ref 0.00–0.07)
Basophils Absolute: 0 10*3/uL (ref 0.0–0.1)
Basophils Relative: 1 %
Eosinophils Absolute: 0.1 10*3/uL (ref 0.0–0.5)
Eosinophils Relative: 2 %
HCT: 39.8 % (ref 36.0–46.0)
Hemoglobin: 14 g/dL (ref 12.0–15.0)
Immature Granulocytes: 0 %
Lymphocytes Relative: 25 %
Lymphs Abs: 1.5 10*3/uL (ref 0.7–4.0)
MCH: 31.6 pg (ref 26.0–34.0)
MCHC: 35.2 g/dL (ref 30.0–36.0)
MCV: 89.8 fL (ref 80.0–100.0)
Monocytes Absolute: 0.5 10*3/uL (ref 0.1–1.0)
Monocytes Relative: 8 %
Neutro Abs: 3.7 10*3/uL (ref 1.7–7.7)
Neutrophils Relative %: 64 %
Platelets: 169 10*3/uL (ref 150–400)
RBC: 4.43 MIL/uL (ref 3.87–5.11)
RDW: 12.4 % (ref 11.5–15.5)
WBC: 5.8 10*3/uL (ref 4.0–10.5)
nRBC: 0 % (ref 0.0–0.2)

## 2020-11-06 LAB — BASIC METABOLIC PANEL
Anion gap: 10 (ref 5–15)
BUN: 21 mg/dL — ABNORMAL HIGH (ref 6–20)
CO2: 17 mmol/L — ABNORMAL LOW (ref 22–32)
Calcium: 9 mg/dL (ref 8.9–10.3)
Chloride: 113 mmol/L — ABNORMAL HIGH (ref 98–111)
Creatinine, Ser: 0.96 mg/dL (ref 0.44–1.00)
GFR, Estimated: >60 mL/min (ref >60)
Glucose, Bld: 77 mg/dL (ref 70–99)
Potassium: 3.6 mmol/L (ref 3.5–5.1)
Sodium: 140 mmol/L (ref 135–145)

## 2020-11-06 LAB — MAGNESIUM: Magnesium: 2.1 mg/dL (ref 1.7–2.4)

## 2020-11-06 LAB — MRSA PCR SCREENING: MRSA by PCR: NEGATIVE

## 2020-11-06 MED ORDER — TOPIRAMATE 100 MG PO TABS
200.0000 mg | ORAL_TABLET | Freq: Two times a day (BID) | ORAL | Status: DC
  Administered 2020-11-06 – 2020-11-08 (×5): 200 mg via ORAL
  Filled 2020-11-06 (×8): qty 2

## 2020-11-06 MED ORDER — WHITE PETROLATUM EX OINT
TOPICAL_OINTMENT | CUTANEOUS | Status: AC
  Filled 2020-11-06: qty 28.35

## 2020-11-06 MED ORDER — WHITE PETROLATUM EX OINT: TOPICAL_OINTMENT | CUTANEOUS | Status: DC | PRN

## 2020-11-06 MED ORDER — INFLUENZA VAC SPLIT QUAD 0.5 ML IM SUSY
0.5000 mL | PREFILLED_SYRINGE | INTRAMUSCULAR | Status: AC
  Administered 2020-11-08: 0.5 mL via INTRAMUSCULAR
  Filled 2020-11-06: qty 0.5

## 2020-11-06 NOTE — Consult Note (Signed)
[facility] Consult   Reason for Consult:  Overdose Referring Physician:  Dr. Sabino Gasser Patient Identification: [name] [name] MRN:  [id number] Principal Diagnosis: Drug overdose Diagnosis:  Principal Problem:   Drug overdose Active Problems:   Bipolar disorder ([place])   Major depressive disorder, recurrent episode ([place])   Total Time spent with patient: 30 minutes  Subjective:   [name] [name] is a 60 y.o. female patient admitted with overdose after being found down at home and unresponsive. Psychiatric consult was placed for suicide attempt. Patient reports having an argument with her children the day of the incident. She reports this argument surrounded inheritance, property assets, and stealing money. She expressed that her parents (son's dad) died last year and left a large lump sum of money to her son, and according to the will she was supposed to get the house. She reports her son and daughter accused her of being irresponsible, stealing money"selling pottery , and not financially stable and has requested that she move out of the home. Patient does admit that this was a trigger for her, and she felt very hopeless and worthless. After in which she proceeded upstairs to take an undisclosed amount of pills in which she recalls "Klonopin, nausea medicine something with a p that I get from the headache clinic, tramadol for my back." She admits to taking these pills with an intent to harm herslef and end her life. SHe reports some depressive symptoms to include sadness, suicidal thoughts, hopeless, worthless, anxious, poor appetite, and decreased concentration. These symptoms did progress after the death of her in laws in which she was really close too. At this time she denies any mania, psychosis, or hallucinations at this time.   During the evaluation patient was alert and oriented, calm and cooperative, and was willing to engage with Probation officer. She does admit to recent  suicide attempt by means of overdose, following an argument with her children that resulted in their request to vacate the property. She does have a townhouse that she can move back into, but it remains unclear if this is something she is interested in as there was some notable hesitancy when answering this question. Her family also has concerns about her as this was her second significant suicide attempt, that required medical intervention. Patient with limited insight and poor judgement into her actions that resulted in this admission, as she originally declined inpatient admission with requests to go home. She reports she has to return back to work and make some money. Writer expressed understanding and reviewed her history with her and reason for this disposition. At this time she denies suicidal ideation, homicidal ideation, and or hallucinations.   HPI:  [name] [name] is a 60 y.o. female with medical history significant for depression/anxiety, history of SI, hyperlipidemia, migraines, presented to the emergency department for chief concerns of unresponsiveness after intentionally ingesting nonprescribed pills.  Per ED provider, patient endorsed to him that she took Valium.  Patient does not have prescription for Valium per PDMP review. Per daughter, patient got the pills off the streets.  At bedside patient opens eyes and is responsive to sternal rub and loud verbal stimuli.  She weakly follow commands including lifting her legs appropriately, wiggling her toes, and both hands squeezing my hand weakly. She is protecting her airway and maintaining appropriate map.  Bear hugger in place for hypothermia initially.   Past Psychiatric History: Depression Disorder. Previously taking cymbalta and receiving outpatient sercvices, which was  discontinued after losing her insurance.  "a total of 4 virtual sessions prior to losing my insurance. " She has no current outpatient service at this time. She  had recent admission to [facility] in 06/2020 for suicide attempt. She reports one other suicide attempt, after spinal surgery "20 years ago" also by overdose. She denies any substance abuse, or legal charges at this time.   Risk to Self:  Yes Risk to Others:  No Prior Inpatient Therapy:  [facility] 06/2020 suicide attempt Prior Outpatient Therapy:   outpatient therapy at [place] [place], for about month following recent discharge, however she lost her insurance.  Previous Psychiatric Mediations: Duloxetine, Trazodone  Past Medical History:  Past Medical History:  Diagnosis Date  . Anxiety   . Arthritis   . Helicobacter pylori (H. pylori) 07/2010  . Migraine     Past Surgical History:  Procedure Laterality Date  . CATARACT EXTRACTION     bilateral  . CERVICAL FUSION    . TUBAL LIGATION    . UPPER GASTROINTESTINAL ENDOSCOPY    . VAGINAL HYSTERECTOMY  2001   USO by history-sono2011 question of both ovaries present   Family History:  Family History  Adopted: Yes  Problem Relation Age of Onset  . Hypertension Mother   . Mental illness Mother   . Hypertension Brother    Family Psychiatric  History: Per patient mother undisclosed mental illness. " I think she had schizophrenia she was getting multiple medications from a lot of doctors. "  Social History:  Social History   Substance and Sexual Activity  Alcohol Use Yes  . Alcohol/week: 0.0 standard drinks   Comment: Very rare     Social History   Substance and Sexual Activity  Drug Use Yes  . Types: Benzodiazepines    Social History   Socioeconomic History  . Marital status: Divorced    Spouse name: Not on file  . Number of children: 1  . Years of education: 48  . Highest education level: Not on file  Occupational History  . Not on file  Tobacco Use  . Smoking status: Former Smoker    Types: Cigarettes  . Smokeless tobacco: Never Used  Vaping Use  . Vaping Use: Never used  Substance and Sexual Activity  .  Alcohol use: Yes    Alcohol/week: 0.0 standard drinks    Comment: Very rare  . Drug use: Yes    Types: Benzodiazepines  . Sexual activity: Yes    Birth control/protection: Post-menopausal, Surgical    Comment: 1st intercourse 60 yo-More than 5 partners  Other Topics Concern  . Not on file  Social History Narrative   Pt went into the foster system at the age of 39. She as in 4 really bad foster homes. She was raised in [place]. Pt has one brother. Pt graduated HS. Pt lives in [place] and has one son and 2 daughters. Divorced x3. 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    Social Determinants of Health   Financial Resource Strain: Not on file  Food Insecurity: Not on file  Transportation Needs: Not on file  Physical Activity: Not on file  Stress: Not on file  Social Connections: Not on file   Additional Social History:    Allergies:  No Known Allergies  Labs:  Results for orders placed or performed during the hospital encounter of 11/04/20 (from the past 48 hour(s))  CBG monitoring, ED     Status: None  Collection Time: 11/04/20  6:13 PM  Result Value Ref Range   Glucose-Capillary 80 70 - 99 mg/dL    Comment: Glucose reference range applies only to samples taken after fasting for at least 8 hours.  Comprehensive metabolic panel     Status: Abnormal   Collection Time: 11/04/20  7:23 PM  Result Value Ref Range   Sodium 141 135 - 145 mmol/L   Potassium 4.1 3.5 - 5.1 mmol/L    Comment: SLIGHT HEMOLYSIS   Chloride 114 (H) 98 - 111 mmol/L   CO2 16 (L) 22 - 32 mmol/L   Glucose, Bld 82 70 - 99 mg/dL    Comment: Glucose reference range applies only to samples taken after fasting for at least 8 hours.   BUN 14 6 - 20 mg/dL   Creatinine, Ser 0.26 0.44 - 1.00 mg/dL   Calcium 9.0 8.9 - 37.8 mg/dL   Total Protein 7.4 6.5 - 8.1 g/dL   Albumin 4.0 3.5 - 5.0 g/dL   AST 25 15 - 41 U/L   ALT 11 0 - 44 U/L   Alkaline Phosphatase 43 38 - 126 U/L   Total Bilirubin 0.7 0.3  - 1.2 mg/dL   GFR, Estimated >58 >85 mL/min    Comment: (NOTE) Calculated using the CKD-EPI Creatinine Equation (2021)    Anion gap 11 5 - 15    Comment: Performed at [facility], [address] [place], [place] [address]  I-Stat venous blood gas, ED     Status: Abnormal   Collection Time: 11/04/20  7:38 PM  Result Value Ref Range   pH, Ven 7.330 7.250 - 7.430   pCO2, Ven 35.0 (L) 44.0 - 60.0 mmHg   pO2, Ven 68.0 (H) 32.0 - 45.0 mmHg   Bicarbonate 18.5 (L) 20.0 - 28.0 mmol/L   TCO2 20 (L) 22 - 32 mmol/L   O2 Saturation 92.0 %   Acid-base deficit 7.0 (H) 0.0 - 2.0 mmol/L   Sodium 145 135 - 145 mmol/L   Potassium 3.8 3.5 - 5.1 mmol/L   Calcium, Ion 1.13 (L) 1.15 - 1.40 mmol/L   HCT 40.0 36.0 - 46.0 %   Hemoglobin 13.6 12.0 - 15.0 g/dL   Sample type VENOUS   Comprehensive metabolic panel     Status: Abnormal   Collection Time: 11/04/20  7:48 PM  Result Value Ref Range   Sodium 142 135 - 145 mmol/L   Potassium 3.9 3.5 - 5.1 mmol/L   Chloride 113 (H) 98 - 111 mmol/L   CO2 21 (L) 22 - 32 mmol/L   Glucose, Bld 87 70 - 99 mg/dL    Comment: Glucose reference range applies only to samples taken after fasting for at least 8 hours.   BUN 13 6 - 20 mg/dL   Creatinine, Ser 1.28 0.44 - 1.00 mg/dL   Calcium 9.3 8.9 - 78.6 mg/dL   Total Protein 7.4 6.5 - 8.1 g/dL   Albumin 4.0 3.5 - 5.0 g/dL   AST 18 15 - 41 U/L   ALT 14 0 - 44 U/L   Alkaline Phosphatase 41 38 - 126 U/L   Total Bilirubin 0.9 0.3 - 1.2 mg/dL   GFR, Estimated >76 >72 mL/min    Comment: (NOTE) Calculated using the CKD-EPI Creatinine Equation (2021)    Anion gap 8 5 - 15    Comment: Performed at [facility], [address], [place], [place] [address]  CBC with Differential  Status: None   Collection Time: 11/04/20  7:48 PM  Result Value Ref Range   WBC 6.0 4.0 - 10.5 K/uL   RBC 4.55 3.87 - 5.11 MIL/uL   Hemoglobin 13.9 12.0 - 15.0 g/dL   HCT 40.941.8 81.136.0 - 91.446.0 %   MCV 91.9 80.0 - 100.0 fL   MCH 30.5  26.0 - 34.0 pg   MCHC 33.3 30.0 - 36.0 g/dL   RDW 78.212.4 95.611.5 - 21.315.5 %   Platelets 181 150 - 400 K/uL   nRBC 0.0 0.0 - 0.2 %   Neutrophils Relative % 58 %   Neutro Abs 3.5 1.7 - 7.7 K/uL   Lymphocytes Relative 32 %   Lymphs Abs 1.9 0.7 - 4.0 K/uL   Monocytes Relative 9 %   Monocytes Absolute 0.5 0.1 - 1.0 K/uL   Eosinophils Relative 1 %   Eosinophils Absolute 0.1 0.0 - 0.5 K/uL   Basophils Relative 0 %   Basophils Absolute 0.0 0.0 - 0.1 K/uL   Immature Granulocytes 0 %   Abs Immature Granulocytes 0.01 0.00 - 0.07 K/uL    Comment: Performed at [facility], [address] [place], [place] [address]  Ethanol     Status: None   Collection Time: 11/04/20  7:48 PM  Result Value Ref Range   Alcohol, Ethyl (B) <10 <10 mg/dL    Comment: (NOTE) Lowest detectable limit for serum alcohol is 10 mg/dL.  For medical purposes only. Performed at [facility], [address] [place], [place] [address]   Protime-INR     Status: None   Collection Time: 11/04/20  7:48 PM  Result Value Ref Range   Prothrombin Time 13.7 11.4 - 15.2 seconds   INR 1.1 0.8 - 1.2    Comment: (NOTE) INR goal varies based on device and disease states. Performed at [facility], [address] [place], [place] [address]   APTT     Status: None   Collection Time: 11/04/20  7:48 PM  Result Value Ref Range   aPTT 26 24 - 36 seconds    Comment: Performed at [facility], [address] [place], [place] [address]  I-Stat beta hCG blood, ED     Status: None   Collection Time: 11/04/20  8:21 PM  Result Value Ref Range   I-stat hCG, quantitative <5.0 <5 mIU/mL   Comment 3            Comment:   GEST. AGE      CONC.  (mIU/mL)   <=1 WEEK        5 - 50     2 WEEKS       50 - 500     3 WEEKS       100 - 10,000     4 WEEKS     1,000 - 30,000        FEMALE AND NON-PREGNANT FEMALE:     LESS THAN 5 mIU/mL   Resp Panel by RT-PCR (Flu A&B, Covid) Nasopharyngeal Swab     Status: None   Collection  Time: 11/04/20 10:29 PM   Specimen: Nasopharyngeal Swab; Nasopharyngeal(NP) swabs in vial transport medium  Result Value Ref Range   SARS Coronavirus 2 by RT PCR NEGATIVE NEGATIVE    Comment: (NOTE) SARS-CoV-2 target nucleic acids are NOT DETECTED.  The SARS-CoV-2 RNA is generally detectable in upper respiratory specimens during the acute phase of infection. The lowest concentration of SARS-CoV-2  viral copies this assay can detect is 138 copies/mL. A negative result does not preclude SARS-Cov-2 infection and should not be used as the sole basis for treatment or other patient management decisions. A negative result may occur with  improper specimen collection/handling, submission of specimen other than nasopharyngeal swab, presence of viral mutation(s) within the areas targeted by this assay, and inadequate number of viral copies(<138 copies/mL). A negative result must be combined with clinical observations, patient history, and epidemiological information. The expected result is Negative.  Fact Sheet for Patients:  BloggerCourse.comhttps://www.fda.gov/media/152166/download  Fact Sheet for Healthcare Providers:  SeriousBroker.ithttps://www.fda.gov/media/152162/download  This test is no t yet approved or cleared by the [place] FDA and  has been authorized for detection and/or diagnosis of SARS-CoV-2 by FDA under an Emergency Use Authorization (EUA). This EUA will remain  in effect (meaning this test can be used) for the duration of the COVID-19 declaration under Section 564(b)(1) of the Act, 21 U.S.C.section 360bbb-3(b)(1), unless the authorization is terminated  or revoked sooner.       Influenza A by PCR NEGATIVE NEGATIVE   Influenza B by PCR NEGATIVE NEGATIVE    Comment: (NOTE) The Xpert Xpress SARS-CoV-2/FLU/RSV plus assay is intended as an aid in the diagnosis of influenza from Nasopharyngeal swab specimens and should not be used as a sole basis for treatment. Nasal washings and aspirates are  unacceptable for Xpert Xpress SARS-CoV-2/FLU/RSV testing.  Fact Sheet for Patients: BloggerCourse.comhttps://www.fda.gov/media/152166/download  Fact Sheet for Healthcare Providers: SeriousBroker.ithttps://www.fda.gov/media/152162/download  This test is not yet approved or cleared by the [place] FDA and has been authorized for detection and/or diagnosis of SARS-CoV-2 by FDA under an Emergency Use Authorization (EUA). This EUA will remain in effect (meaning this test can be used) for the duration of the COVID-19 declaration under Section 564(b)(1) of the Act, 21 U.S.C. section 360bbb-3(b)(1), unless the authorization is terminated or revoked.  Performed at [facility], [address] [place], [place] [address]   Salicylate level     Status: Abnormal   Collection Time: 11/04/20 11:47 PM  Result Value Ref Range   Salicylate Lvl <7.0 (L) 7.0 - 30.0 mg/dL    Comment: Performed at [facility], [address] [place], [place] [address]  Acetaminophen level     Status: Abnormal   Collection Time: 11/04/20 11:47 PM  Result Value Ref Range   Acetaminophen (Tylenol), Serum <10 (L) 10 - 30 ug/mL    Comment: (NOTE) Therapeutic concentrations vary significantly. A range of 10-30 ug/mL  may be an effective concentration for many patients. However, some  are best treated at concentrations outside of this range. Acetaminophen concentrations >150 ug/mL at 4 hours after ingestion  and >50 ug/mL at 12 hours after ingestion are often associated with  toxic reactions.  Performed at [facility], [address] [place], [place] [address]   Urinalysis, Complete w Microscopic     Status: Abnormal   Collection Time: 11/05/20 12:30 AM  Result Value Ref Range   Color, Urine STRAW (A) YELLOW   APPearance CLEAR CLEAR   Specific Gravity, Urine 1.008 1.005 - 1.030   pH 7.0 5.0 - 8.0   Glucose, UA NEGATIVE NEGATIVE mg/dL   Hgb urine dipstick NEGATIVE NEGATIVE   Bilirubin Urine NEGATIVE NEGATIVE    Ketones, ur NEGATIVE NEGATIVE mg/dL   Protein, ur NEGATIVE NEGATIVE mg/dL   Nitrite NEGATIVE NEGATIVE   Leukocytes,Ua NEGATIVE NEGATIVE   WBC, UA 0-5 0 - 5 WBC/hpf  Bacteria, UA NONE SEEN NONE SEEN   Squamous Epithelial / LPF 0-5 0 - 5   Mucus PRESENT     Comment: Performed at [facility], [place] [address] [place], [place] [address]  Urine rapid drug screen (hosp performed)     Status: Abnormal   Collection Time: 11/05/20 12:30 AM  Result Value Ref Range   Opiates NONE DETECTED NONE DETECTED   Cocaine NONE DETECTED NONE DETECTED   Benzodiazepines POSITIVE (A) NONE DETECTED   Amphetamines NONE DETECTED NONE DETECTED   Tetrahydrocannabinol NONE DETECTED NONE DETECTED   Barbiturates NONE DETECTED NONE DETECTED    Comment: (NOTE) DRUG SCREEN FOR MEDICAL PURPOSES ONLY.  IF CONFIRMATION IS NEEDED FOR ANY PURPOSE, NOTIFY LAB WITHIN 5 DAYS.  LOWEST DETECTABLE LIMITS FOR URINE DRUG SCREEN Drug Class                     Cutoff (ng/mL) Amphetamine and metabolites    1000 Barbiturate and metabolites    200 Benzodiazepine                 [id number] Tricyclics and metabolites     300 Opiates and metabolites        300 Cocaine and metabolites        300 THC                            50 Performed at [facility], [place] [address], [place], [place] [address]   HIV Antibody (routine testing w rflx)     Status: None   Collection Time: 11/05/20  2:16 AM  Result Value Ref Range   HIV Screen 4th Generation wRfx Non Reactive Non Reactive    Comment: Performed at [facility], [place] [address], [place], [place] [address]  TSH     Status: None   Collection Time: 11/05/20  2:16 AM  Result Value Ref Range   TSH 2.157 0.350 - 4.500 uIU/mL    Comment: Performed by a 3rd Generation assay with a functional sensitivity of <=0.01 uIU/mL. Performed at [facility], [place] [address], [place], [place] [id number]   Basic metabolic panel     Status: Abnormal   Collection Time: 11/05/20  2:16  AM  Result Value Ref Range   Sodium 141 135 - 145 mmol/L   Potassium 3.6 3.5 - 5.1 mmol/L    Comment: SPECIMEN HEMOLYZED. HEMOLYSIS MAY AFFECT INTEGRITY OF RESULTS.   Chloride 115 (H) 98 - 111 mmol/L   CO2 20 (L) 22 - 32 mmol/L   Glucose, Bld 84 70 - 99 mg/dL    Comment: Glucose reference range applies only to samples taken after fasting for at least 8 hours.   BUN 12 6 - 20 mg/dL   Creatinine, Ser 0.78 0.44 - 1.00 mg/dL   Calcium 8.6 (L) 8.9 - 10.3 mg/dL   GFR, Estimated >60 >60 mL/min    Comment: (NOTE) Calculated using the CKD-EPI Creatinine Equation (2021)    Anion gap 6 5 - 15    Comment: Performed at [place] [address], [place] [address]  CBC     Status: None   Collection Time: 11/05/20  2:16 AM  Result Value Ref Range   WBC 5.1 4.0 - 10.5 K/uL   RBC 4.13 3.87 - 5.11 MIL/uL   Hemoglobin 13.0 12.0 - 15.0 g/dL   HCT 37.6 36.0 -  46.0 %   MCV 91.0 80.0 - 100.0 fL   MCH 31.5 26.0 - 34.0 pg   MCHC 34.6 30.0 - 36.0 g/dL   RDW 12.5 11.5 - 15.5 %   Platelets 170 150 - 400 K/uL   nRBC 0.0 0.0 - 0.2 %    Comment: Performed at [facility], [place] [address], [place], [place] [address]  MRSA PCR Screening     Status: None   Collection Time: 11/05/20 11:34 PM   Specimen: Nasal Mucosa; Nasopharyngeal  Result Value Ref Range   MRSA by PCR NEGATIVE NEGATIVE    Comment:        The GeneXpert MRSA Assay (FDA approved for NASAL specimens only), is one component of a comprehensive MRSA colonization surveillance program. It is not intended to diagnose MRSA infection nor to guide or monitor treatment for MRSA infections. Performed at [facility], [place] [address] [place], [place] [id number]   Basic metabolic panel     Status: Abnormal   Collection Time: 11/06/20 12:26 AM  Result Value Ref Range   Sodium 140 135 - 145 mmol/L   Potassium 3.6 3.5 - 5.1 mmol/L   Chloride 113 (H) 98 - 111 mmol/L   CO2 17 (L) 22 - 32 mmol/L   Glucose, Bld 77 70 - 99  mg/dL    Comment: Glucose reference range applies only to samples taken after fasting for at least 8 hours.   BUN 21 (H) 6 - 20 mg/dL   Creatinine, Ser 0.96 0.44 - 1.00 mg/dL   Calcium 9.0 8.9 - 10.3 mg/dL   GFR, Estimated >60 >60 mL/min    Comment: (NOTE) Calculated using the CKD-EPI Creatinine Equation (2021)    Anion gap 10 5 - 15    Comment: Performed at [place] [address], [place], [place] [address]  CBC with Differential/Platelet     Status: None   Collection Time: 11/06/20 12:26 AM  Result Value Ref Range   WBC 5.8 4.0 - 10.5 K/uL   RBC 4.43 3.87 - 5.11 MIL/uL   Hemoglobin 14.0 12.0 - 15.0 g/dL   HCT 39.8 36.0 - 46.0 %   MCV 89.8 80.0 - 100.0 fL   MCH 31.6 26.0 - 34.0 pg   MCHC 35.2 30.0 - 36.0 g/dL   RDW 12.4 11.5 - 15.5 %   Platelets 169 150 - 400 K/uL   nRBC 0.0 0.0 - 0.2 %   Neutrophils Relative % 64 %   Neutro Abs 3.7 1.7 - 7.7 K/uL   Lymphocytes Relative 25 %   Lymphs Abs 1.5 0.7 - 4.0 K/uL   Monocytes Relative 8 %   Monocytes Absolute 0.5 0.1 - 1.0 K/uL   Eosinophils Relative 2 %   Eosinophils Absolute 0.1 0.0 - 0.5 K/uL   Basophils Relative 1 %   Basophils Absolute 0.0 0.0 - 0.1 K/uL   Immature Granulocytes 0 %   Abs Immature Granulocytes 0.00 0.00 - 0.07 K/uL    Comment: Performed at [facility], [address] [place], [place] [address]  Magnesium     Status: None   Collection Time: 11/06/20 12:26 AM  Result Value Ref Range   Magnesium 2.1 1.7 - 2.4 mg/dL    Comment: Performed at [place] [address], [place],  [address]    Current Facility-Administered Medications  Medication Dose Route Frequency Provider Last Rate Last Admin  . acetaminophen (TYLENOL) tablet 650 mg  650 mg Oral Q6H PRN Cox, Amy N, DO       Or  . acetaminophen (TYLENOL) suppository 650 mg  650 mg Rectal Q6H PRN Cox, Amy N, DO      . DULoxetine (CYMBALTA) DR capsule 40 mg  40 mg Oral Daily Cox, Amy N, DO   40 mg at 11/06/20 1028  .  enoxaparin (LOVENOX) injection 40 mg  40 mg Subcutaneous Daily Cox, Amy N, DO   40 mg at 11/06/20 1029  . [START ON 11/07/2020] influenza vac split quadrivalent PF (FLUARIX) injection 0.5 mL  0.5 mL Intramuscular Tomorrow-1000 Dwyane Dee, MD      . ondansetron [facility]) tablet 4 mg  4 mg Oral Q6H PRN Cox, Amy N, DO       Or  . ondansetron (ZOFRAN) injection 4 mg  4 mg Intravenous Q6H PRN Cox, Amy N, DO      . rosuvastatin (CRESTOR) tablet 5 mg  5 mg Oral Daily Cox, Amy N, DO   5 mg at 11/06/20 1028  . topiramate (TOPAMAX) tablet 200 mg  200 mg Oral BID Dwyane Dee, MD      . white petrolatum (VASELINE) gel   Topical PRN Dwyane Dee, MD      . white petrolatum (VASELINE) gel             Musculoskeletal: Strength & Muscle Tone: within normal limits Gait & Station: normal Patient leans: N/A  Psychiatric Specialty Exam: Physical Exam  Review of Systems  Blood pressure (!) 127/94, pulse 73, temperature 98 F (36.7 C), temperature source Oral, resp. rate 16, height 5\' 1"  (1.549 m), weight 61.3 kg, last menstrual period 11/02/2000, SpO2 97 %.Body mass index is 25.53 kg/m.  General Appearance: Casual and wearing hospital gown  Eye Contact:  Fair  Speech:  Clear and Coherent and Normal Rate  Volume:  Normal  Mood:  Depressed  Affect:  Constricted, Depressed and Flat  Thought Process:  Coherent, Linear and Descriptions of Associations: Intact  Orientation:  Full (Time, Place, and Person)  Thought Content:  Logical  Suicidal Thoughts:  Denies at this time, however admits to suicide attempt that resulted in hospital admission.   Homicidal Thoughts:  No  Memory:  Immediate;   Fair Recent;   Fair  Judgement:  Intact  Insight:  Shallow  Psychomotor Activity:  Normal  Concentration:  Concentration: Fair and Attention Span: Fair  Recall:  Good  Fund of Knowledge:  Good  Language:  Good  Akathisia:  Negative  Handed:  Right  AIMS (if indicated):     Assets:  Medical sales representative Housing Leisure Time Physical Health Resilience  ADL's:  Intact  Cognition:  WNL  Sleep:        Treatment Plan Summary: Plan WIll continue current medications as prescribed. Patient to benefit from inpatient psychiatric admission. Recommend working closely with SW to facilitate admission to inpatient psychiatric hospital once she is medically cleared.   Disposition: Recommend psychiatric Inpatient admission when medically cleared. Patient to benefit from acute hospitalization at this time. She is voluntary, and did discuss with patient in the event she attempts to leave she will be placed under IVC.  -After completion of Inpatient admission, also discussed with her the likelihood of participating in IOP/PHP considering her psychosocial stressors, family dynamics, and psychiatric history (2 suicide attempts).  Suella Broad, FNP 11/06/2020 11:27 AM

## 2020-11-06 NOTE — Progress Notes (Addendum)
CSW made referral to [facility] and [facility]  1518: CSW spoke with pt and notified her of referrals. Pt consents to CSW calling and talking with son.   CSW calls pt son and provides update and information about tx options.   1705: CSW faxed additional referrals to [facility], [place], [place], [place]

## 2020-11-06 NOTE — Progress Notes (Signed)
PROGRESS NOTE    Cacia Covalt   [id number]  DOB: Oct 11, 1961  DOA: 11/04/2020     0  PCP: Elby Showers, MD  CC: found less responsive   Hospital Course: Ms. Ronnald Ramp is a 60 yo female with PMH bipolar disorder, depression/anxiety, GAD who was brought to the ER after being found minimally responsive by family. Daughter had reported that there was an argument amongst family, then the patient went to her room and had taken an unknown amount of Xanax.  She is not prescribed benzodiazepines or opioids (database reviewed).  Her daughter states that she gets the Xanax from friends. The patient was recently hospitalized at behavioral health from 06/03/20 - 06/06/20 for treatment of recurrent episode of major depression.  This was a voluntary admission.  Patient's UDS was positive for benzodiazepines on admission.  Patient underwent imaging with CT head, CT cervical spine, CXR, CTA chest/abdomen/pelvis.  She had no acute findings.  Initially she was placed in a hard c-collar which was removed after negative imaging findings were reviewed the morning following admission.  With further monitoring, her mentation continued to improve and she became alert and oriented.  She was evaluated by psychiatry and expressed wishes are trying to harm herself after the family altercation.  She is still unable to provide details on what was actually taken but appears to be sedating agents, either sleep aids or benzos.    Interval History:  No events overnight.  Sitter at bedside this morning.  Patient is awake and alert. She is able to provide details about the argument at home and endorsed to psych that her intent was self harm. Still unclear details about what she took but the intent remains the same.  She is amenable at this time for voluntary admission to inpt psych.   Old records reviewed in assessment of this patient  ROS: Constitutional: negative for chills and fevers, Respiratory: negative for  cough, Cardiovascular: negative for chest pain and Gastrointestinal: negative for abdominal pain   Assessment & Plan: * Drug overdose -Intentional drug overdose per daughter.  Patient was found minimally responsive and has a history of similar - with further monitoring she has now become awake, alert, and oriented. Medications have washed out and she appears to be stable at this time - d/c tele and can transfer to med-surg bed -1:1 sitter ordered for suicide precautions; IVC if tries to elope -Psychiatry consult placed, appreciate assistance -Given intentional self-harm, patient is recommended for inpt admission per psych - she is now medically stable for discharge to inpt psych bed once a bed is available/approved   Major depressive disorder, recurrent episode ([place]) -Follow-up psychiatry consult -Continue Cymbalta  Bipolar disorder ([place]) - topamax resumed on 1/5   Antimicrobials: n/a  DVT prophylaxis: Lovenox Code Status: Full Family Communication: Daughter on the phone Disposition Plan: Status is: Observation  The patient remains OBS appropriate and will d/c before 2 midnights.  Dispo: The patient is from: Home              Anticipated d/c is to: Inpatient psychiatric hospitalization              Anticipated d/c date is: When bed available              Patient currently is medically stable to d/c.  Objective: Blood pressure 111/68, pulse 74, temperature 97.9 F (36.6 C), temperature source Oral, resp. rate (!) 22, height 5\' 1"  (1.549 m), weight 61.3 kg, last menstrual  period 11/02/2000, SpO2 97 %.  Examination: General appearance: adult woman now awake and alert; NAD; AOx4. Sitter bedside Head: Normocephalic, without obvious abnormality, atraumatic Eyes: EOMI Lungs: clear to auscultation bilaterally Heart: regular rate and rhythm and S1, S2 normal Abdomen: normal findings: bowel sounds normal and soft, non-tender Extremities: No edema Skin: mobility and turgor  normal Neurologic: No obvious focal deficits.   Consultants:   Psychiatry  Procedures:     Data Reviewed: I have personally reviewed following labs and imaging studies Results for orders placed or performed during the hospital encounter of 11/04/20 (from the past 24 hour(s))  MRSA PCR Screening     Status: None   Collection Time: 11/05/20 11:34 PM   Specimen: Nasal Mucosa; Nasopharyngeal  Result Value Ref Range   MRSA by PCR NEGATIVE NEGATIVE  Basic metabolic panel     Status: Abnormal   Collection Time: 11/06/20 12:26 AM  Result Value Ref Range   Sodium 140 135 - 145 mmol/L   Potassium 3.6 3.5 - 5.1 mmol/L   Chloride 113 (H) 98 - 111 mmol/L   CO2 17 (L) 22 - 32 mmol/L   Glucose, Bld 77 70 - 99 mg/dL   BUN 21 (H) 6 - 20 mg/dL   Creatinine, Ser 0.96 0.44 - 1.00 mg/dL   Calcium 9.0 8.9 - 10.3 mg/dL   GFR, Estimated >60 >60 mL/min   Anion gap 10 5 - 15  CBC with Differential/Platelet     Status: None   Collection Time: 11/06/20 12:26 AM  Result Value Ref Range   WBC 5.8 4.0 - 10.5 K/uL   RBC 4.43 3.87 - 5.11 MIL/uL   Hemoglobin 14.0 12.0 - 15.0 g/dL   HCT 39.8 36.0 - 46.0 %   MCV 89.8 80.0 - 100.0 fL   MCH 31.6 26.0 - 34.0 pg   MCHC 35.2 30.0 - 36.0 g/dL   RDW 12.4 11.5 - 15.5 %   Platelets 169 150 - 400 K/uL   nRBC 0.0 0.0 - 0.2 %   Neutrophils Relative % 64 %   Neutro Abs 3.7 1.7 - 7.7 K/uL   Lymphocytes Relative 25 %   Lymphs Abs 1.5 0.7 - 4.0 K/uL   Monocytes Relative 8 %   Monocytes Absolute 0.5 0.1 - 1.0 K/uL   Eosinophils Relative 2 %   Eosinophils Absolute 0.1 0.0 - 0.5 K/uL   Basophils Relative 1 %   Basophils Absolute 0.0 0.0 - 0.1 K/uL   Immature Granulocytes 0 %   Abs Immature Granulocytes 0.00 0.00 - 0.07 K/uL  Magnesium     Status: None   Collection Time: 11/06/20 12:26 AM  Result Value Ref Range   Magnesium 2.1 1.7 - 2.4 mg/dL    Recent Results (from the past 240 hour(s))  Resp Panel by RT-PCR (Flu A&B, Covid) Nasopharyngeal Swab     Status:  None   Collection Time: 11/04/20 10:29 PM   Specimen: Nasopharyngeal Swab; Nasopharyngeal(NP) swabs in vial transport medium  Result Value Ref Range Status   SARS Coronavirus 2 by RT PCR NEGATIVE NEGATIVE Final    Comment: (NOTE) SARS-CoV-2 target nucleic acids are NOT DETECTED.  The SARS-CoV-2 RNA is generally detectable in upper respiratory specimens during the acute phase of infection. The lowest concentration of SARS-CoV-2 viral copies this assay can detect is 138 copies/mL. A negative result does not preclude SARS-Cov-2 infection and should not be used as the sole basis for treatment or other patient management decisions. A negative result may  occur with  improper specimen collection/handling, submission of specimen other than nasopharyngeal swab, presence of viral mutation(s) within the areas targeted by this assay, and inadequate number of viral copies(<138 copies/mL). A negative result must be combined with clinical observations, patient history, and epidemiological information. The expected result is Negative.  Fact Sheet for Patients:  EntrepreneurPulse.com.au  Fact Sheet for Healthcare Providers:  IncredibleEmployment.be  This test is no t yet approved or cleared by the [place] FDA and  has been authorized for detection and/or diagnosis of SARS-CoV-2 by FDA under an Emergency Use Authorization (EUA). This EUA will remain  in effect (meaning this test can be used) for the duration of the COVID-19 declaration under Section 564(b)(1) of the Act, 21 U.S.C.section 360bbb-3(b)(1), unless the authorization is terminated  or revoked sooner.       Influenza A by PCR NEGATIVE NEGATIVE Final   Influenza B by PCR NEGATIVE NEGATIVE Final    Comment: (NOTE) The Xpert Xpress SARS-CoV-2/FLU/RSV plus assay is intended as an aid in the diagnosis of influenza from Nasopharyngeal swab specimens and should not be used as a sole basis for  treatment. Nasal washings and aspirates are unacceptable for Xpert Xpress SARS-CoV-2/FLU/RSV testing.  Fact Sheet for Patients: EntrepreneurPulse.com.au  Fact Sheet for Healthcare Providers: IncredibleEmployment.be  This test is not yet approved or cleared by the [place] FDA and has been authorized for detection and/or diagnosis of SARS-CoV-2 by FDA under an Emergency Use Authorization (EUA). This EUA will remain in effect (meaning this test can be used) for the duration of the COVID-19 declaration under Section 564(b)(1) of the Act, 21 U.S.C. section 360bbb-3(b)(1), unless the authorization is terminated or revoked.  Performed at [facility], [place] [address] [place], [place] [address]   MRSA PCR Screening     Status: None   Collection Time: 11/05/20 11:34 PM   Specimen: Nasal Mucosa; Nasopharyngeal  Result Value Ref Range Status   MRSA by PCR NEGATIVE NEGATIVE Final    Comment:        The GeneXpert MRSA Assay (FDA approved for NASAL specimens only), is one component of a comprehensive MRSA colonization surveillance program. It is not intended to diagnose MRSA infection nor to guide or monitor treatment for MRSA infections. Performed at [facility], [place] [address], [place], [place] [address]      Radiology Studies: CT Head Wo Contrast  Result Date: 11/04/2020 CLINICAL DATA:  Head trauma, abnormal mental status. Neck trauma, focal neuro deficit or paresthesia. Additional history provided: Patient found down. EXAM: CT HEAD WITHOUT CONTRAST CT CERVICAL SPINE WITHOUT CONTRAST TECHNIQUE: Multidetector CT imaging of the head and cervical spine was performed following the standard protocol without intravenous contrast. Multiplanar CT image reconstructions of the cervical spine were also generated. COMPARISON:  Head CT 02/22/2019. Radiographs of the cervical spine 01/19/2013. FINDINGS: CT HEAD FINDINGS Brain: Cerebral volume is  normal for age. There is no acute intracranial hemorrhage. No demarcated cortical infarct. No extra-axial fluid collection. No evidence of intracranial mass. No midline shift. Vascular: No hyperdense vessel.  Atherosclerotic calcifications. Skull: Normal. Negative for fracture or focal lesion. Sinuses/Orbits: Visualized orbits show no acute finding. Trace ethmoid sinus mucosal thickening. CT CERVICAL SPINE FINDINGS Alignment: Straightening of the expected cervical lordosis. No significant spondylolisthesis. Skull base and vertebrae: The basion-dental and atlanto-dental intervals are maintained.No evidence of acute fracture to the cervical spine. Congenital nonunion of the posterior arch of C1. Prior C4-C6 ACDF. Solid fusion across the disc spaces and  facet joints at these levels. No evidence of hardware compromise. Soft tissues and spinal canal: No prevertebral fluid or swelling. No visible canal hematoma. Disc levels: Cervical spondylosis most notably as follows. At C3-C4, there is mild disc degeneration with a disc bulge and uncovertebral hypertrophy. Apparent mild spinal canal stenosis at this level. At C6-C7, there is moderate disc degeneration with a disc bulge and uncovertebral hypertrophy. Bilateral neural foraminal narrowing with suspected mild/moderate spinal canal stenosis at this level. Upper chest: No consolidation within the imaged lung apices. No visible pneumothorax. IMPRESSION: CT head: No evidence of acute intracranial abnormality. CT cervical spine: 1. No evidence of acute fracture to the cervical spine. 2. Prior C4-C6 ACDF. No evidence of hardware compromise. 3. Cervical spondylosis as described and greatest at C3-C4 and C6-C7. Electronically Signed   By: Jackey Loge DO   On: 11/04/2020 18:52   CT Cervical Spine Wo Contrast  Result Date: 11/04/2020 CLINICAL DATA:  Head trauma, abnormal mental status. Neck trauma, focal neuro deficit or paresthesia. Additional history provided: Patient found  down. EXAM: CT HEAD WITHOUT CONTRAST CT CERVICAL SPINE WITHOUT CONTRAST TECHNIQUE: Multidetector CT imaging of the head and cervical spine was performed following the standard protocol without intravenous contrast. Multiplanar CT image reconstructions of the cervical spine were also generated. COMPARISON:  Head CT 02/22/2019. Radiographs of the cervical spine 01/19/2013. FINDINGS: CT HEAD FINDINGS Brain: Cerebral volume is normal for age. There is no acute intracranial hemorrhage. No demarcated cortical infarct. No extra-axial fluid collection. No evidence of intracranial mass. No midline shift. Vascular: No hyperdense vessel.  Atherosclerotic calcifications. Skull: Normal. Negative for fracture or focal lesion. Sinuses/Orbits: Visualized orbits show no acute finding. Trace ethmoid sinus mucosal thickening. CT CERVICAL SPINE FINDINGS Alignment: Straightening of the expected cervical lordosis. No significant spondylolisthesis. Skull base and vertebrae: The basion-dental and atlanto-dental intervals are maintained.No evidence of acute fracture to the cervical spine. Congenital nonunion of the posterior arch of C1. Prior C4-C6 ACDF. Solid fusion across the disc spaces and facet joints at these levels. No evidence of hardware compromise. Soft tissues and spinal canal: No prevertebral fluid or swelling. No visible canal hematoma. Disc levels: Cervical spondylosis most notably as follows. At C3-C4, there is mild disc degeneration with a disc bulge and uncovertebral hypertrophy. Apparent mild spinal canal stenosis at this level. At C6-C7, there is moderate disc degeneration with a disc bulge and uncovertebral hypertrophy. Bilateral neural foraminal narrowing with suspected mild/moderate spinal canal stenosis at this level. Upper chest: No consolidation within the imaged lung apices. No visible pneumothorax. IMPRESSION: CT head: No evidence of acute intracranial abnormality. CT cervical spine: 1. No evidence of acute  fracture to the cervical spine. 2. Prior C4-C6 ACDF. No evidence of hardware compromise. 3. Cervical spondylosis as described and greatest at C3-C4 and C6-C7. Electronically Signed   By: Jackey Loge DO   On: 11/04/2020 18:52   DG CHEST PORT 1 VIEW  Addendum Date: 11/05/2020   ADDENDUM REPORT: 11/05/2020 00:46 ADDENDUM: These results were called by telephone at the time of interpretation on 11/05/2020 at 12:43 am to provider AMY COX , who verbally acknowledged these results. Electronically Signed   By: Helyn Numbers MD   On: 11/05/2020 00:46   Result Date: 11/05/2020 CLINICAL DATA:  Altered mental status EXAM: PORTABLE CHEST 1 VIEW COMPARISON:  02/22/2019 FINDINGS: There is superior mediastinal widening, new from prior examination, and the aortic knob is indistinct raising the question of vascular injury and mediastinal hematoma. Less likely, this may  represent mediastinal adenopathy. Cardiac size within normal limits. Lungs are clear. No pneumothorax or pleural effusion. IMPRESSION: Mediastinal widening. CT arteriography of the a chest is recommended for further evaluation. Electronically Signed: By: Fidela Salisbury MD On: 11/05/2020 00:41   CT Angio Chest/Abd/Pel for Dissection W and/or Wo Contrast  Result Date: 11/05/2020 CLINICAL DATA:  Superior mediastinal widening. EXAM: CT ANGIOGRAPHY CHEST, ABDOMEN AND PELVIS TECHNIQUE: Non-contrast CT of the chest was initially obtained. Multidetector CT imaging through the chest, abdomen and pelvis was performed using the standard protocol during bolus administration of intravenous contrast. Multiplanar reconstructed images and MIPs were obtained and reviewed to evaluate the vascular anatomy. CONTRAST:  151mL OMNIPAQUE IOHEXOL 350 MG/ML SOLN COMPARISON:  None. FINDINGS: CTA CHEST FINDINGS Cardiovascular: There is variant aortic arch anatomy with an aberrant origin of the right subclavian artery. The aberrant right subclavian artery demonstrates fusiform aneurysm  proximally measuring 2.0 cm in greatest diameter. There is mild dilation of the aortic arch in this region measuring 3.2 x 3.0 cm in greatest dimension. The ascending aorta and descending aorta are of normal caliber measuring 2.8 cm and 2.2 cm in diameter respectively. No significant atherosclerotic calcification identified. No intramural hematoma or dissection identified. The visualized proximal arch vasculature is widely patent. No significant coronary artery calcification. Global cardiac size within normal limits. No pericardial effusion. The central pulmonary arteries are of normal caliber. There is excellent opacification of the pulmonary arterial tree and no intraluminal filling defect is identified to suggest acute pulmonary embolism. Mediastinum/Nodes: There is mild dilation of the esophagus proximal to the a aberrant right subclavian artery which demonstrates mass effect upon the proximal esophagus. No pathologic thoracic adenopathy. Visualized thyroid is unremarkable. Lungs/Pleura: Lungs are clear. No pleural effusion or pneumothorax. Musculoskeletal: No chest wall abnormality. No acute or significant osseous findings. Review of the MIP images confirms the above findings. CTA ABDOMEN AND PELVIS FINDINGS VASCULAR Aorta: Normal caliber. No aneurysm or dissection. Minimal infrarenal atherosclerotic calcification. Celiac: Widely patent.  Normal anatomic configuration. SMA: Widely patent. Renals: Dual left and single right renal arteries. Widely patent. Normal vascular morphology. No aneurysm. IMA: Widely patent Inflow: Widely patent. Internal iliac arteries are patent bilaterally. Veins: Not well opacified, but morphologically unremarkable save for duplication of the inferior vena cava. Review of the MIP images confirms the above findings. NON-VASCULAR Hepatobiliary: Simple cyst within the left hepatic lobe. Liver otherwise unremarkable. Gallbladder unremarkable. No intra or extrahepatic biliary ductal  dilation. Pancreas: Unremarkable Spleen: Unremarkable Adrenals/Urinary Tract: Adrenal glands are unremarkable. Kidneys are normal, without renal calculi, focal lesion, or hydronephrosis. Bladder is unremarkable. Stomach/Bowel: There is an avidly enhancing intramural mass at the ligament of Treitz involving the a left lateral wall of the bowel likely representing a a gastrointestinal stromal tumor measuring 1.8 x 3.2 cm in greatest dimension on axial image # 134/7 and coronal image # 49/10. The stomach, small bowel, and large bowel are otherwise unremarkable. Appendix normal. No free intraperitoneal gas or fluid. Lymphatic: No pathologic adenopathy. Reproductive: Status post hysterectomy. No adnexal masses. Other: Rectum unremarkable.  Tiny umbilical fat containing hernia. Musculoskeletal: Transitional lumbar anatomy. Degenerative changes noted within the lumbar spine. No lytic or blastic bone lesion. No acute bone abnormality. Review of the MIP images confirms the above findings. IMPRESSION: Aberrant origin of the right subclavian artery. Fusiform 2 cm aneurysm of the proximal right subclavian artery. Mild dilation of the aortic arch. No intramural hematoma or dissection identified. Recommend annual imaging followup by CTA or MRA. This recommendation follows 2010  ACCF/AHA/AATS/ACR/ASA/SCA/SCAI/SIR/STS/SVM Guidelines for the Diagnosis and Management of Patients with Thoracic Aortic Disease. Circulation.2010; [id number]. Aortic aneurysm NOS (ICD10-I71.9) Normal examination of the abdominal and pelvic vasculature save for minimal atherosclerotic calcification within the abdominal aorta and duplication of the inferior vena cava. 3.2 cm avidly enhancing intramural mass involving the fourth portion of the duodenum at the ligament of Treitz most likely representing a small bowel gastrointestinal stromal tumor. Correlation for gastrointestinal hemorrhage is recommended. Endoscopic correlation may be helpful for tissue  sampling. Aortic Atherosclerosis (ICD10-I70.0). Electronically Signed   By: Fidela Salisbury MD   On: 11/05/2020 03:05   CT Angio Chest/Abd/Pel for Dissection W and/or Wo Contrast  Final Result    DG CHEST PORT 1 VIEW  Final Result  Addendum 1 of 1  ADDENDUM REPORT: 11/05/2020 00:46    ADDENDUM:  These results were called by telephone at the time of interpretation  on 11/05/2020 at 12:43 am to provider AMY COX , who verbally  acknowledged these results.      Electronically Signed    By: Fidela Salisbury MD    On: 11/05/2020 00:46      Final    CT Head Wo Contrast  Final Result    CT Cervical Spine Wo Contrast  Final Result      Scheduled Meds: . DULoxetine  40 mg Oral Daily  . enoxaparin (LOVENOX) injection  40 mg Subcutaneous Daily  . [START ON 11/07/2020] influenza vac split quadrivalent PF  0.5 mL Intramuscular Tomorrow-1000  . rosuvastatin  5 mg Oral Daily  . topiramate  200 mg Oral BID   PRN Meds: acetaminophen **OR** acetaminophen, ondansetron **OR** ondansetron (ZOFRAN) IV, white petrolatum Continuous Infusions:   LOS: 0 days  Time spent: Greater than 50% of the 35 minute visit was spent in counseling/coordination of care for the patient as laid out in the A&P.   Dwyane Dee, MD [facility] 11/06/2020, 2:10 PM

## 2020-11-06 NOTE — Plan of Care (Signed)

## 2020-11-07 DIAGNOSIS — T50902A Poisoning by unspecified drugs, medicaments and biological substances, intentional self-harm, initial encounter: Secondary | ICD-10-CM | POA: Diagnosis not present

## 2020-11-07 NOTE — Progress Notes (Signed)
Pt arrived to 6N06 via wheelchair, ambulated to bed with standby assist. Pt alert and oriented x4. No c/o of pain or discomfort. Safety sitter at bedside. Will continue to monitor.

## 2020-11-07 NOTE — Progress Notes (Signed)
Pt with order to transfer to 6N. Report given. Pt is A& Ox4. No c/o any pain or discomfort. V/S stable. Transferred to 6N without any difficulty.

## 2020-11-07 NOTE — Progress Notes (Signed)
PROGRESS NOTE    [name] [name]   XBM:[id number]  DOB: 1960-12-02  DOA: 11/04/2020     1  PCP: [name], MD  CC: found less responsive   Hospital Course: [name] [name] is a 60 yo female with PMH bipolar disorder, depression/anxiety, GAD who was brought to the ER after being found minimally responsive by family. Daughter had reported that there was an argument amongst family, then the patient went to her room and had taken an unknown amount of Xanax.  She is not prescribed benzodiazepines or opioids (database reviewed).  Her daughter states that she gets the Xanax from friends. The patient was recently hospitalized at behavioral health from 06/03/20 - 06/06/20 for treatment of recurrent episode of major depression.  This was a voluntary admission.  Patient's UDS was positive for benzodiazepines on admission.  Patient underwent imaging with CT head, CT cervical spine, CXR, CTA chest/abdomen/pelvis.  She had no acute findings.  Initially she was placed in a hard c-collar which was removed after negative imaging findings were reviewed the morning following admission.  With further monitoring, her mentation continued to improve and she became alert and oriented.  She was evaluated by psychiatry and expressed wishes for trying to harm herself after the family altercation.  She is still unable to provide details on what was actually ingested but appears to be sedating agents, either sleep aids or benzos.    Interval History:  No events overnight.  Sitter at bedside this morning.   She is more tearful with depressed mood today.  Expressing wishes to just go home.  Discussed with her bedside that we are still awaiting bed placement for inpatient hospitalization and that she would benefit from further hospitalization to discuss more coping skills when she becomes stressed/angry and/or depressed.  She may also require medication adjustment.  She was tearful but understanding of this.  Old  records reviewed in assessment of this patient  ROS: Constitutional: negative for chills and fevers, Respiratory: negative for cough, Cardiovascular: negative for chest pain and Gastrointestinal: negative for abdominal pain   Assessment & Plan: * Drug overdose -Intentional drug overdose per daughter.  Patient was found minimally responsive and has a history of similar - with further monitoring she has now become awake, alert, and oriented. Medications have washed out and she appears to be stable at this time - d/c tele and can transfer to med-surg bed -1:1 sitter ordered for suicide precautions; IVC if tries to elope -Psychiatry consult placed, appreciate assistance -Given intentional self-harm, patient is recommended for inpt admission per psych; awaiting placement at this time - she is now medically stable for discharge to inpt psych bed once a bed is available/approved   Major depressive disorder, recurrent episode (HCC) -Follow-up psychiatry consult -Continue Cymbalta  Bipolar disorder (HCC) - topamax resumed on 1/5   Antimicrobials: n/a  DVT prophylaxis: Lovenox Code Status: Full Family Communication: Daughter aware of plan on 1/5 Disposition Plan: Status is: Inpatient  Remains inpatient appropriate because:Unsafe d/c plan and Inpatient level of care appropriate due to severity of illness   Dispo: The patient is from: Home              Anticipated d/c is to: Inpt psych hospital              Anticipated d/c date is: When bed available              Patient currently is medically stable to d/c.   Objective:  Blood pressure 119/82, pulse 61, temperature 98.2 F (36.8 C), temperature source Oral, resp. rate 16, height 5\' 1"  (1.549 m), weight 61.2 kg, last menstrual period 11/02/2000, SpO2 99 %.  Examination: General appearance: adult woman now awake and alert; NAD; AOx4. Sitter bedside Head: Normocephalic, without obvious abnormality, atraumatic Eyes: EOMI Lungs: clear  to auscultation bilaterally Heart: regular rate and rhythm and S1, S2 normal Abdomen: normal findings: bowel sounds normal and soft, non-tender Extremities: No edema Skin: mobility and turgor normal Neurologic: No obvious focal deficits.   Consultants:   Psychiatry  Procedures:     Data Reviewed: I have personally reviewed following labs and imaging studies No results found for this or any previous visit (from the past 24 hour(s)).  Recent Results (from the past 240 hour(s))  Resp Panel by RT-PCR (Flu A&B, Covid) Nasopharyngeal Swab     Status: None   Collection Time: 11/04/20 10:29 PM   Specimen: Nasopharyngeal Swab; Nasopharyngeal(NP) swabs in vial transport medium  Result Value Ref Range Status   SARS Coronavirus 2 by RT PCR NEGATIVE NEGATIVE Final    Comment: (NOTE) SARS-CoV-2 target nucleic acids are NOT DETECTED.  The SARS-CoV-2 RNA is generally detectable in upper respiratory specimens during the acute phase of infection. The lowest concentration of SARS-CoV-2 viral copies this assay can detect is 138 copies/mL. A negative result does not preclude SARS-Cov-2 infection and should not be used as the sole basis for treatment or other patient management decisions. A negative result may occur with  improper specimen collection/handling, submission of specimen other than nasopharyngeal swab, presence of viral mutation(s) within the areas targeted by this assay, and inadequate number of viral copies(<138 copies/mL). A negative result must be combined with clinical observations, patient history, and epidemiological information. The expected result is Negative.  Fact Sheet for Patients:  EntrepreneurPulse.com.au  Fact Sheet for Healthcare Providers:  IncredibleEmployment.be  This test is no t yet approved or cleared by the [place] FDA and  has been authorized for detection and/or diagnosis of SARS-CoV-2 by FDA under an Emergency  Use Authorization (EUA). This EUA will remain  in effect (meaning this test can be used) for the duration of the COVID-19 declaration under Section 564(b)(1) of the Act, 21 U.S.C.section 360bbb-3(b)(1), unless the authorization is terminated  or revoked sooner.       Influenza A by PCR NEGATIVE NEGATIVE Final   Influenza B by PCR NEGATIVE NEGATIVE Final    Comment: (NOTE) The Xpert Xpress SARS-CoV-2/FLU/RSV plus assay is intended as an aid in the diagnosis of influenza from Nasopharyngeal swab specimens and should not be used as a sole basis for treatment. Nasal washings and aspirates are unacceptable for Xpert Xpress SARS-CoV-2/FLU/RSV testing.  Fact Sheet for Patients: EntrepreneurPulse.com.au  Fact Sheet for Healthcare Providers: IncredibleEmployment.be  This test is not yet approved or cleared by the [place] FDA and has been authorized for detection and/or diagnosis of SARS-CoV-2 by FDA under an Emergency Use Authorization (EUA). This EUA will remain in effect (meaning this test can be used) for the duration of the COVID-19 declaration under Section 564(b)(1) of the Act, 21 U.S.C. section 360bbb-3(b)(1), unless the authorization is terminated or revoked.  Performed at [facility], [place] [address], [place], [place] [address]   MRSA PCR Screening     Status: None   Collection Time: 11/05/20 11:34 PM   Specimen: Nasal Mucosa; Nasopharyngeal  Result Value Ref Range Status   MRSA by PCR NEGATIVE NEGATIVE Final  Comment:        The GeneXpert MRSA Assay (FDA approved for NASAL specimens only), is one component of a comprehensive MRSA colonization surveillance program. It is not intended to diagnose MRSA infection nor to guide or monitor treatment for MRSA infections. Performed at [facility], [place] [address] [place], [place] [address]      Radiology Studies: No results found. CT Angio Chest/Abd/Pel for  Dissection W and/or Wo Contrast  Final Result    DG CHEST PORT 1 VIEW  Final Result  Addendum 1 of 1  ADDENDUM REPORT: 11/05/2020 00:46    ADDENDUM:  These results were called by telephone at the time of interpretation  on 11/05/2020 at 12:43 am to provider AMY COX , who verbally  acknowledged these results.      Electronically Signed    By: Fidela Salisbury MD    On: 11/05/2020 00:46      Final    CT Head Wo Contrast  Final Result    CT Cervical Spine Wo Contrast  Final Result      Scheduled Meds: . DULoxetine  40 mg Oral Daily  . enoxaparin (LOVENOX) injection  40 mg Subcutaneous Daily  . influenza vac split quadrivalent PF  0.5 mL Intramuscular Tomorrow-1000  . rosuvastatin  5 mg Oral Daily  . topiramate  200 mg Oral BID   PRN Meds: acetaminophen **OR** acetaminophen, ondansetron **OR** ondansetron (ZOFRAN) IV, white petrolatum Continuous Infusions:   LOS: 1 day  Time spent: Greater than 50% of the 35 minute visit was spent in counseling/coordination of care for the patient as laid out in the A&P.   Dwyane Dee, MD [facility] 11/07/2020, 12:59 PM

## 2020-11-08 ENCOUNTER — Other Ambulatory Visit: Payer: Self-pay | Admitting: Psychiatry

## 2020-11-08 ENCOUNTER — Other Ambulatory Visit: Payer: Self-pay

## 2020-11-08 ENCOUNTER — Inpatient Hospital Stay (HOSPITAL_COMMUNITY)
Admission: AD | Admit: 2020-11-08 | Discharge: 2020-11-13 | DRG: 885 | Disposition: A | Discharge status: Home / Self Care | Payer: No Payment, Other | Source: Intra-hospital | Attending: Psychiatry | Admitting: Psychiatry

## 2020-11-08 DIAGNOSIS — Z818 Family history of other mental and behavioral disorders: Secondary | ICD-10-CM | POA: Diagnosis not present

## 2020-11-08 DIAGNOSIS — F332 Major depressive disorder, recurrent severe without psychotic features: Secondary | ICD-10-CM | POA: Diagnosis present

## 2020-11-08 DIAGNOSIS — F319 Bipolar disorder, unspecified: Principal | ICD-10-CM | POA: Diagnosis present

## 2020-11-08 DIAGNOSIS — G43909 Migraine, unspecified, not intractable, without status migrainosus: Secondary | ICD-10-CM | POA: Diagnosis present

## 2020-11-08 DIAGNOSIS — F4310 Post-traumatic stress disorder, unspecified: Secondary | ICD-10-CM | POA: Diagnosis present

## 2020-11-08 DIAGNOSIS — Z79899 Other long term (current) drug therapy: Secondary | ICD-10-CM

## 2020-11-08 DIAGNOSIS — F3112 Bipolar disorder, current episode manic without psychotic features, moderate: Secondary | ICD-10-CM | POA: Diagnosis not present

## 2020-11-08 DIAGNOSIS — E785 Hyperlipidemia, unspecified: Secondary | ICD-10-CM | POA: Diagnosis present

## 2020-11-08 DIAGNOSIS — T50902A Poisoning by unspecified drugs, medicaments and biological substances, intentional self-harm, initial encounter: Secondary | ICD-10-CM | POA: Diagnosis not present

## 2020-11-08 DIAGNOSIS — Z9151 Personal history of suicidal behavior: Secondary | ICD-10-CM

## 2020-11-08 DIAGNOSIS — T50902S Poisoning by unspecified drugs, medicaments and biological substances, intentional self-harm, sequela: Secondary | ICD-10-CM | POA: Diagnosis not present

## 2020-11-08 DIAGNOSIS — T424X2A Poisoning by benzodiazepines, intentional self-harm, initial encounter: Secondary | ICD-10-CM | POA: Diagnosis present

## 2020-11-08 DIAGNOSIS — F3113 Bipolar disorder, current episode manic without psychotic features, severe: Secondary | ICD-10-CM | POA: Diagnosis present

## 2020-11-08 DIAGNOSIS — Z87891 Personal history of nicotine dependence: Secondary | ICD-10-CM | POA: Diagnosis not present

## 2020-11-08 DIAGNOSIS — F419 Anxiety disorder, unspecified: Secondary | ICD-10-CM

## 2020-11-08 DIAGNOSIS — G43001 Migraine without aura, not intractable, with status migrainosus: Secondary | ICD-10-CM | POA: Diagnosis not present

## 2020-11-08 MED ORDER — BENZOCAINE 10 % MT GEL
Freq: Four times a day (QID) | OROMUCOSAL | Status: DC | PRN
  Filled 2020-11-08: qty 9

## 2020-11-08 MED ORDER — MAGNESIUM HYDROXIDE 400 MG/5ML PO SUSP
30.0000 mL | Freq: Every day | ORAL | Status: DC | PRN
  Administered 2020-11-11 – 2020-11-12 (×2): 30 mL via ORAL
  Filled 2020-11-08 (×2): qty 30

## 2020-11-08 MED ORDER — TOPIRAMATE 100 MG PO TABS
200.0000 mg | ORAL_TABLET | Freq: Two times a day (BID) | ORAL | Status: DC
  Administered 2020-11-08 – 2020-11-13 (×10): 200 mg via ORAL
  Filled 2020-11-08 (×15): qty 2

## 2020-11-08 MED ORDER — HYDROXYZINE HCL 25 MG PO TABS
25.0000 mg | ORAL_TABLET | Freq: Three times a day (TID) | ORAL | Status: DC | PRN
  Administered 2020-11-08 – 2020-11-12 (×7): 25 mg via ORAL
  Filled 2020-11-08 (×8): qty 1

## 2020-11-08 MED ORDER — ROSUVASTATIN CALCIUM 5 MG PO TABS
5.0000 mg | ORAL_TABLET | Freq: Every day | ORAL | Status: DC
  Administered 2020-11-09 – 2020-11-13 (×5): 5 mg via ORAL
  Filled 2020-11-08 (×7): qty 1

## 2020-11-08 MED ORDER — ACETAMINOPHEN 325 MG PO TABS
650.0000 mg | ORAL_TABLET | Freq: Four times a day (QID) | ORAL | Status: DC | PRN
  Administered 2020-11-09 – 2020-11-11 (×2): 650 mg via ORAL
  Filled 2020-11-08 (×2): qty 2

## 2020-11-08 MED ORDER — DULOXETINE HCL 20 MG PO CPEP
40.0000 mg | ORAL_CAPSULE | Freq: Every day | ORAL | Status: DC
Start: 2020-11-09 — End: 2020-11-10
  Administered 2020-11-09 – 2020-11-10 (×2): 40 mg via ORAL
  Filled 2020-11-08 (×4): qty 2

## 2020-11-08 MED ORDER — ALUM & MAG HYDROXIDE-SIMETH 200-200-20 MG/5ML PO SUSP: 30.0000 mL | ORAL | Status: DC | PRN

## 2020-11-08 MED ORDER — TRAZODONE HCL 50 MG PO TABS
50.0000 mg | ORAL_TABLET | Freq: Every evening | ORAL | Status: DC | PRN
  Administered 2020-11-08 – 2020-11-09 (×2): 50 mg via ORAL
  Filled 2020-11-08 (×2): qty 1

## 2020-11-08 NOTE — Progress Notes (Signed)
Pt accepted to [facility], bed 305-2    Sheran Fava, NP is the accepting provider.    Dr. Mallie Darting is the attending provider.    Call report to [place] @ [facility] notified.     Pt is voluntary and will be transported by [facility]  Please call report to get anticipated arrival time.   Audree Camel, MSW, LCSW, [place] Clinical Social Worker II Disposition CSW [phone]

## 2020-11-08 NOTE — Progress Notes (Signed)
D: Patient presents with flat and sad affect. Patient reports being in a sad mood at the time of assessment. Patient is guarded and minimal upon interaction but is cooperative. Patient denies SI/HI at this time. Patient also denies AH/VH at this time. Patient contracts for safety.  A: Provided positive reinforcement and encouragement.  R: Patient cooperative and receptive to efforts. Patient remains safe on the unit.   11/08/20 2200  Psych Admission Type (Psych Patients Only)  Admission Status Voluntary  Psychosocial Assessment  Patient Complaints Depression  Eye Contact Fair  Facial Expression Sad;Flat  Affect Sad  Speech Logical/coherent;Soft  Interaction Assertive  Motor Activity Other (Comment) (WDL)  Appearance/Hygiene Unremarkable  Behavior Characteristics Cooperative;Appropriate to situation  Mood Sad  Thought Process  Coherency WDL  Content WDL  Delusions None reported or observed  Perception WDL  Hallucination None reported or observed  Judgment WDL  Confusion None  Danger to Self  Current suicidal ideation? Denies  Danger to Others  Danger to Others None reported or observed

## 2020-11-08 NOTE — Progress Notes (Signed)
CSW placed call to [facility] to check on status of referral, awaiting response.  Laveda Abbe, [place] Clinical Social Worker [phone]

## 2020-11-08 NOTE — Tx Team (Signed)
Initial Treatment Plan 11/08/2020 7:05 PM [name] [name] ZDG:[id number]    PATIENT STRESSORS: Marital or family conflict Substance abuse Traumatic event   PATIENT STRENGTHS: Ability for insight Average or above average intelligence General fund of knowledge   PATIENT IDENTIFIED PROBLEMS: Suicidal ideation  Ineffective coping  Substance use disorder - taking benzos not prescribed to pt                 DISCHARGE CRITERIA:  Ability to meet basic life and health needs Improved stabilization in mood, thinking, and/or behavior Verbal commitment to aftercare and medication compliance  PRELIMINARY DISCHARGE PLAN: Outpatient therapy Return to previous living arrangement  PATIENT/FAMILY INVOLVEMENT: This treatment plan has been presented to and reviewed with the patient, [name] [name].  The patient has been given the opportunity to ask questions and make suggestions.  Luna Glasgow, RN 11/08/2020, 7:05 PM

## 2020-11-08 NOTE — Progress Notes (Signed)
Admission Note: Patient is a 60 year old female admitted to the unit for symptoms of depression, medication noncompliance and suicidal ideation.  Patient overdose on Xanax that she took from her daughter's friend after an argument with family members.  EMS found her unresponsive when they got to her house.  Patient report her children as trigger.  She reports her children calling her "worthless and a bad mother."  States she is here to get help for her depression and to feel better about herself.  Patient presents with a flat affect and depressed mood.  Currently denies suicidal ideation and verbally contracts for safety.  Admission plan of care reviewed and treatment consent signed.  Skin assessment and personal belongings completed.  Skin is dry and intact.  No contraband found.  Patient oriented to the unit, staff and room.  Routine safety checks initiated.  Verbalizes understanding of unit rules and protocols.  Patient is safe on the unit.

## 2020-11-08 NOTE — Plan of Care (Signed)
  Problem: Health Behavior/Discharge Planning: Goal: Ability to manage health-related needs will improve Outcome: Adequate for Discharge   Problem: Education: Goal: Knowledge of General Education information will improve Description: Including pain rating scale, medication(s)/side effects and non-pharmacologic comfort measures Outcome: Adequate for Discharge   Problem: Clinical Measurements: Goal: Ability to maintain clinical measurements within normal limits will improve Outcome: Adequate for Discharge Goal: Will remain free from infection Outcome: Adequate for Discharge Goal: Diagnostic test results will improve Outcome: Adequate for Discharge Goal: Respiratory complications will improve Outcome: Adequate for Discharge Goal: Cardiovascular complication will be avoided Outcome: Adequate for Discharge   Problem: Activity: Goal: Risk for activity intolerance will decrease Outcome: Adequate for Discharge   Problem: Nutrition: Goal: Adequate nutrition will be maintained Outcome: Adequate for Discharge   Problem: Coping: Goal: Level of anxiety will decrease Outcome: Adequate for Discharge   Problem: Elimination: Goal: Will not experience complications related to bowel motility Outcome: Adequate for Discharge Goal: Will not experience complications related to urinary retention Outcome: Adequate for Discharge   Problem: Pain Managment: Goal: General experience of comfort will improve Outcome: Adequate for Discharge   Problem: Safety: Goal: Ability to remain free from injury will improve Outcome: Adequate for Discharge   Problem: Skin Integrity: Goal: Risk for impaired skin integrity will decrease Outcome: Adequate for Discharge   

## 2020-11-08 NOTE — Progress Notes (Signed)
Patient discharging to [facility], see note from Audree Camel, LCSW for admission details. CSW informed patient and daughter, they are in agreement. Voluntary form signed and on chart to go with patient, transportation set through [facility]. No other needs at this time.  Laveda Abbe, [place] Clinical Social Worker [phone]

## 2020-11-08 NOTE — Discharge Summary (Addendum)
Physician Discharge Summary  Naziya Hegwood NWG:[id number] DOB: 1961-03-13 DOA: 11/04/2020  PCP: Elby Showers, MD  Admit date: 11/04/2020  Discharge date: 11/08/2020  Admitted From:Home  Disposition:  [facility]  Recommendations for Outpatient Follow-up:  1. Follow up with PCP in 1-2 weeks 2. Continue medications as noted below and follow-up at [facility] 3. Patient noted to have incidental finding of duodenal mass on imaging with referral sent to GI for further evaluation.  Home Health: None  Equipment/Devices: None  Discharge Condition:Stable  CODE STATUS: Full  Diet recommendation: Heart Healthy  Brief/Interim Summary: Ms. Ronnald Ramp is a 60 yo female with PMH bipolar disorder, depression/anxiety, GAD who was brought to the ER after being found minimally responsive by family. Daughter had reported that there was an argument amongst family, then the patient went to her room and had taken an unknown amount of Xanax.  She is not prescribed benzodiazepines or opioids (database reviewed).  Her daughter states that she gets the Xanax from friends. The patient was recently hospitalized at behavioral health from 06/03/20 - 06/06/20 for treatment of recurrent episode of major depression.  This was a voluntary admission.  Patient's UDS was positive for benzodiazepines on admission.  Patient underwent imaging with CT head, CT cervical spine, CXR, CTA chest/abdomen/pelvis.  She had no acute findings.  Initially she was placed in a hard c-collar which was removed after negative imaging findings were reviewed the morning following admission.  With further monitoring, her mentation continued to improve and she became alert and oriented.  She was evaluated by psychiatry and expressed wishes for trying to harm herself after the family altercation.  She is still unable to provide details on what was actually ingested but appears to be sedating agents, either sleep aids or benzos.   Psychiatry evaluation with  recommendations for [facility] placement.  Patient to be discharged to appropriate facility once bed made available.  She has remained with sitter at bedside during this hospitalization with no other acute events or concerns noted.  Discharge Diagnoses:  Principal Problem:   Drug overdose Active Problems:   Bipolar disorder ([place])   Major depressive disorder, recurrent episode ([place])  Principal discharge diagnosis: Intentional drug overdose with benzodiazepines.  Discharge Instructions   Allergies as of 11/08/2020   No Known Allergies     Medication List    STOP taking these medications   hydrOXYzine 25 MG tablet Commonly known as: ATARAX/VISTARIL   traZODone 50 MG tablet Commonly known as: DESYREL     TAKE these medications   acetaminophen 500 MG tablet Commonly known as: TYLENOL Take 500 mg by mouth every 6 (six) hours as needed for headache.   bisacodyl 5 MG EC tablet Commonly known as: DULCOLAX Take 1 tablet (5 mg total) by mouth daily as needed. (May buy from over the counter): For constipation   DULoxetine HCl 40 MG Cpep Take 40 mg by mouth daily. For depression   meloxicam 15 MG tablet Commonly known as: MOBIC Take 1 tablet (15 mg total) by mouth daily.   polyethylene glycol 17 g packet Commonly known as: MIRALAX / GLYCOLAX Take 17 g by mouth daily as needed. (May buy from over the counter): For constipation   rosuvastatin 5 MG tablet Commonly known as: Crestor Take 1 tablet (5 mg total) by mouth daily.   topiramate 200 MG tablet Commonly known as: TOPAMAX Take 1 tablet (200 mg total) by mouth 2 (two) times daily. For mood stabilization  Follow-up Information    Baxley, Cresenciano Lick, MD Follow up in 1 week(s).   Specialty: Internal Medicine Contact information: [phone] [phone]              No Known Allergies  Consultations:  Inpatient psychiatry   Procedures/Studies: CT Head Wo Contrast  Result  Date: 11/04/2020 CLINICAL DATA:  Head trauma, abnormal mental status. Neck trauma, focal neuro deficit or paresthesia. Additional history provided: Patient found down. EXAM: CT HEAD WITHOUT CONTRAST CT CERVICAL SPINE WITHOUT CONTRAST TECHNIQUE: Multidetector CT imaging of the head and cervical spine was performed following the standard protocol without intravenous contrast. Multiplanar CT image reconstructions of the cervical spine were also generated. COMPARISON:  Head CT 02/22/2019. Radiographs of the cervical spine 01/19/2013. FINDINGS: CT HEAD FINDINGS Brain: Cerebral volume is normal for age. There is no acute intracranial hemorrhage. No demarcated cortical infarct. No extra-axial fluid collection. No evidence of intracranial mass. No midline shift. Vascular: No hyperdense vessel.  Atherosclerotic calcifications. Skull: Normal. Negative for fracture or focal lesion. Sinuses/Orbits: Visualized orbits show no acute finding. Trace ethmoid sinus mucosal thickening. CT CERVICAL SPINE FINDINGS Alignment: Straightening of the expected cervical lordosis. No significant spondylolisthesis. Skull base and vertebrae: The basion-dental and atlanto-dental intervals are maintained.No evidence of acute fracture to the cervical spine. Congenital nonunion of the posterior arch of C1. Prior C4-C6 ACDF. Solid fusion across the disc spaces and facet joints at these levels. No evidence of hardware compromise. Soft tissues and spinal canal: No prevertebral fluid or swelling. No visible canal hematoma. Disc levels: Cervical spondylosis most notably as follows. At C3-C4, there is mild disc degeneration with a disc bulge and uncovertebral hypertrophy. Apparent mild spinal canal stenosis at this level. At C6-C7, there is moderate disc degeneration with a disc bulge and uncovertebral hypertrophy. Bilateral neural foraminal narrowing with suspected mild/moderate spinal canal stenosis at this level. Upper chest: No consolidation within the  imaged lung apices. No visible pneumothorax. IMPRESSION: CT head: No evidence of acute intracranial abnormality. CT cervical spine: 1. No evidence of acute fracture to the cervical spine. 2. Prior C4-C6 ACDF. No evidence of hardware compromise. 3. Cervical spondylosis as described and greatest at C3-C4 and C6-C7. Electronically Signed   By: Kellie Simmering DO   On: 11/04/2020 18:52   CT Cervical Spine Wo Contrast  Result Date: 11/04/2020 CLINICAL DATA:  Head trauma, abnormal mental status. Neck trauma, focal neuro deficit or paresthesia. Additional history provided: Patient found down. EXAM: CT HEAD WITHOUT CONTRAST CT CERVICAL SPINE WITHOUT CONTRAST TECHNIQUE: Multidetector CT imaging of the head and cervical spine was performed following the standard protocol without intravenous contrast. Multiplanar CT image reconstructions of the cervical spine were also generated. COMPARISON:  Head CT 02/22/2019. Radiographs of the cervical spine 01/19/2013. FINDINGS: CT HEAD FINDINGS Brain: Cerebral volume is normal for age. There is no acute intracranial hemorrhage. No demarcated cortical infarct. No extra-axial fluid collection. No evidence of intracranial mass. No midline shift. Vascular: No hyperdense vessel.  Atherosclerotic calcifications. Skull: Normal. Negative for fracture or focal lesion. Sinuses/Orbits: Visualized orbits show no acute finding. Trace ethmoid sinus mucosal thickening. CT CERVICAL SPINE FINDINGS Alignment: Straightening of the expected cervical lordosis. No significant spondylolisthesis. Skull base and vertebrae: The basion-dental and atlanto-dental intervals are maintained.No evidence of acute fracture to the cervical spine. Congenital nonunion of the posterior arch of C1. Prior C4-C6 ACDF. Solid fusion across the disc spaces and facet joints at these levels. No evidence of hardware compromise. Soft tissues  and spinal canal: No prevertebral fluid or swelling. No visible canal hematoma. Disc levels:  Cervical spondylosis most notably as follows. At C3-C4, there is mild disc degeneration with a disc bulge and uncovertebral hypertrophy. Apparent mild spinal canal stenosis at this level. At C6-C7, there is moderate disc degeneration with a disc bulge and uncovertebral hypertrophy. Bilateral neural foraminal narrowing with suspected mild/moderate spinal canal stenosis at this level. Upper chest: No consolidation within the imaged lung apices. No visible pneumothorax. IMPRESSION: CT head: No evidence of acute intracranial abnormality. CT cervical spine: 1. No evidence of acute fracture to the cervical spine. 2. Prior C4-C6 ACDF. No evidence of hardware compromise. 3. Cervical spondylosis as described and greatest at C3-C4 and C6-C7. Electronically Signed   By: Kellie Simmering DO   On: 11/04/2020 18:52   DG CHEST PORT 1 VIEW  Addendum Date: 11/05/2020   ADDENDUM REPORT: 11/05/2020 00:46 ADDENDUM: These results were called by telephone at the time of interpretation on 11/05/2020 at 12:43 am to provider AMY COX , who verbally acknowledged these results. Electronically Signed   By: Fidela Salisbury MD   On: 11/05/2020 00:46   Result Date: 11/05/2020 CLINICAL DATA:  Altered mental status EXAM: PORTABLE CHEST 1 VIEW COMPARISON:  02/22/2019 FINDINGS: There is superior mediastinal widening, new from prior examination, and the aortic knob is indistinct raising the question of vascular injury and mediastinal hematoma. Less likely, this may represent mediastinal adenopathy. Cardiac size within normal limits. Lungs are clear. No pneumothorax or pleural effusion. IMPRESSION: Mediastinal widening. CT arteriography of the a chest is recommended for further evaluation. Electronically Signed: By: Fidela Salisbury MD On: 11/05/2020 00:41   CT Angio Chest/Abd/Pel for Dissection W and/or Wo Contrast  Result Date: 11/05/2020 CLINICAL DATA:  Superior mediastinal widening. EXAM: CT ANGIOGRAPHY CHEST, ABDOMEN AND PELVIS TECHNIQUE:  Non-contrast CT of the chest was initially obtained. Multidetector CT imaging through the chest, abdomen and pelvis was performed using the standard protocol during bolus administration of intravenous contrast. Multiplanar reconstructed images and MIPs were obtained and reviewed to evaluate the vascular anatomy. CONTRAST:  139mL OMNIPAQUE IOHEXOL 350 MG/ML SOLN COMPARISON:  None. FINDINGS: CTA CHEST FINDINGS Cardiovascular: There is variant aortic arch anatomy with an aberrant origin of the right subclavian artery. The aberrant right subclavian artery demonstrates fusiform aneurysm proximally measuring 2.0 cm in greatest diameter. There is mild dilation of the aortic arch in this region measuring 3.2 x 3.0 cm in greatest dimension. The ascending aorta and descending aorta are of normal caliber measuring 2.8 cm and 2.2 cm in diameter respectively. No significant atherosclerotic calcification identified. No intramural hematoma or dissection identified. The visualized proximal arch vasculature is widely patent. No significant coronary artery calcification. Global cardiac size within normal limits. No pericardial effusion. The central pulmonary arteries are of normal caliber. There is excellent opacification of the pulmonary arterial tree and no intraluminal filling defect is identified to suggest acute pulmonary embolism. Mediastinum/Nodes: There is mild dilation of the esophagus proximal to the a aberrant right subclavian artery which demonstrates mass effect upon the proximal esophagus. No pathologic thoracic adenopathy. Visualized thyroid is unremarkable. Lungs/Pleura: Lungs are clear. No pleural effusion or pneumothorax. Musculoskeletal: No chest wall abnormality. No acute or significant osseous findings. Review of the MIP images confirms the above findings. CTA ABDOMEN AND PELVIS FINDINGS VASCULAR Aorta: Normal caliber. No aneurysm or dissection. Minimal infrarenal atherosclerotic calcification. Celiac: Widely  patent.  Normal anatomic configuration. SMA: Widely patent. Renals: Dual left and single right renal arteries.  Widely patent. Normal vascular morphology. No aneurysm. IMA: Widely patent Inflow: Widely patent. Internal iliac arteries are patent bilaterally. Veins: Not well opacified, but morphologically unremarkable save for duplication of the inferior vena cava. Review of the MIP images confirms the above findings. NON-VASCULAR Hepatobiliary: Simple cyst within the left hepatic lobe. Liver otherwise unremarkable. Gallbladder unremarkable. No intra or extrahepatic biliary ductal dilation. Pancreas: Unremarkable Spleen: Unremarkable Adrenals/Urinary Tract: Adrenal glands are unremarkable. Kidneys are normal, without renal calculi, focal lesion, or hydronephrosis. Bladder is unremarkable. Stomach/Bowel: There is an avidly enhancing intramural mass at the ligament of Treitz involving the a left lateral wall of the bowel likely representing a a gastrointestinal stromal tumor measuring 1.8 x 3.2 cm in greatest dimension on axial image # 134/7 and coronal image # 49/10. The stomach, small bowel, and large bowel are otherwise unremarkable. Appendix normal. No free intraperitoneal gas or fluid. Lymphatic: No pathologic adenopathy. Reproductive: Status post hysterectomy. No adnexal masses. Other: Rectum unremarkable.  Tiny umbilical fat containing hernia. Musculoskeletal: Transitional lumbar anatomy. Degenerative changes noted within the lumbar spine. No lytic or blastic bone lesion. No acute bone abnormality. Review of the MIP images confirms the above findings. IMPRESSION: Aberrant origin of the right subclavian artery. Fusiform 2 cm aneurysm of the proximal right subclavian artery. Mild dilation of the aortic arch. No intramural hematoma or dissection identified. Recommend annual imaging followup by CTA or MRA. This recommendation follows 2010 ACCF/AHA/AATS/ACR/ASA/SCA/SCAI/SIR/STS/SVM Guidelines for the Diagnosis and  Management of Patients with Thoracic Aortic Disease. Circulation.2010; [id number]. Aortic aneurysm NOS (ICD10-I71.9) Normal examination of the abdominal and pelvic vasculature save for minimal atherosclerotic calcification within the abdominal aorta and duplication of the inferior vena cava. 3.2 cm avidly enhancing intramural mass involving the fourth portion of the duodenum at the ligament of Treitz most likely representing a small bowel gastrointestinal stromal tumor. Correlation for gastrointestinal hemorrhage is recommended. Endoscopic correlation may be helpful for tissue sampling. Aortic Atherosclerosis (ICD10-I70.0). Electronically Signed   By: Fidela Salisbury MD   On: 11/05/2020 03:05      Discharge Exam: Vitals:   11/07/20 2211 11/08/20 0555  BP: 125/78 123/80  Pulse: 66 68  Resp: 18 16  Temp: 99 F (37.2 C) 98.2 F (36.8 C)  SpO2: 98% 98%   Vitals:   11/07/20 0550 11/07/20 1320 11/07/20 2211 11/08/20 0555  BP: 119/82 134/85 125/78 123/80  Pulse: 61 71 66 68  Resp: 16 17 18 16   Temp: 98.2 F (36.8 C) 98.5 F (36.9 C) 99 F (37.2 C) 98.2 F (36.8 C)  TempSrc: Oral Oral Oral Oral  SpO2: 99%  98% 98%  Weight:      Height:        General: Pt is alert, awake, not in acute distress Cardiovascular: RRR, S1/S2 +, no rubs, no gallops Respiratory: CTA bilaterally, no wheezing, no rhonchi Abdominal: Soft, NT, ND, bowel sounds + Extremities: no edema, no cyanosis    The results of significant diagnostics from this hospitalization (including imaging, microbiology, ancillary and laboratory) are listed below for reference.     Microbiology: Recent Results (from the past 240 hour(s))  Resp Panel by RT-PCR (Flu A&B, Covid) Nasopharyngeal Swab     Status: None   Collection Time: 11/04/20 10:29 PM   Specimen: Nasopharyngeal Swab; Nasopharyngeal(NP) swabs in vial transport medium  Result Value Ref Range Status   SARS Coronavirus 2 by RT PCR NEGATIVE NEGATIVE Final     Comment: (NOTE) SARS-CoV-2 target nucleic acids are NOT DETECTED.  The SARS-CoV-2  RNA is generally detectable in upper respiratory specimens during the acute phase of infection. The lowest concentration of SARS-CoV-2 viral copies this assay can detect is 138 copies/mL. A negative result does not preclude SARS-Cov-2 infection and should not be used as the sole basis for treatment or other patient management decisions. A negative result may occur with  improper specimen collection/handling, submission of specimen other than nasopharyngeal swab, presence of viral mutation(s) within the areas targeted by this assay, and inadequate number of viral copies(<138 copies/mL). A negative result must be combined with clinical observations, patient history, and epidemiological information. The expected result is Negative.  Fact Sheet for Patients:  EntrepreneurPulse.com.au  Fact Sheet for Healthcare Providers:  IncredibleEmployment.be  This test is no t yet approved or cleared by the [place] FDA and  has been authorized for detection and/or diagnosis of SARS-CoV-2 by FDA under an Emergency Use Authorization (EUA). This EUA will remain  in effect (meaning this test can be used) for the duration of the COVID-19 declaration under Section 564(b)(1) of the Act, 21 U.S.C.section 360bbb-3(b)(1), unless the authorization is terminated  or revoked sooner.       Influenza A by PCR NEGATIVE NEGATIVE Final   Influenza B by PCR NEGATIVE NEGATIVE Final    Comment: (NOTE) The Xpert Xpress SARS-CoV-2/FLU/RSV plus assay is intended as an aid in the diagnosis of influenza from Nasopharyngeal swab specimens and should not be used as a sole basis for treatment. Nasal washings and aspirates are unacceptable for Xpert Xpress SARS-CoV-2/FLU/RSV testing.  Fact Sheet for Patients: EntrepreneurPulse.com.au  Fact Sheet for Healthcare  Providers: IncredibleEmployment.be  This test is not yet approved or cleared by the [place] FDA and has been authorized for detection and/or diagnosis of SARS-CoV-2 by FDA under an Emergency Use Authorization (EUA). This EUA will remain in effect (meaning this test can be used) for the duration of the COVID-19 declaration under Section 564(b)(1) of the Act, 21 U.S.C. section 360bbb-3(b)(1), unless the authorization is terminated or revoked.  Performed at [facility], [place] [address] [place], [place] [address]   MRSA PCR Screening     Status: None   Collection Time: 11/05/20 11:34 PM   Specimen: Nasal Mucosa; Nasopharyngeal  Result Value Ref Range Status   MRSA by PCR NEGATIVE NEGATIVE Final    Comment:        The GeneXpert MRSA Assay (FDA approved for NASAL specimens only), is one component of a comprehensive MRSA colonization surveillance program. It is not intended to diagnose MRSA infection nor to guide or monitor treatment for MRSA infections. Performed at [facility], [place] [address] [place], [place] [address]      Labs: BNP (last 3 results) No results for input(s): BNP in the last 8760 hours. Basic Metabolic Panel: Recent Labs  Lab 11/04/20 1923 11/04/20 1938 11/04/20 1948 11/05/20 0216 11/06/20 0026  NA 141 145 142 141 140  K 4.1 3.8 3.9 3.6 3.6  CL 114*  --  113* 115* 113*  CO2 16*  --  21* 20* 17*  GLUCOSE 82  --  87 84 77  BUN 14  --  13 12 21*  CREATININE 0.82  --  0.83 0.78 0.96  CALCIUM 9.0  --  9.3 8.6* 9.0  MG  --   --   --   --  2.1   Liver Function Tests: Recent Labs  Lab 11/04/20 1923 11/04/20 1948  AST 25 18  ALT 11 14  ALKPHOS  43 41  BILITOT 0.7 0.9  PROT 7.4 7.4  ALBUMIN 4.0 4.0   No results for input(s): LIPASE, AMYLASE in the last 168 hours. No results for input(s): AMMONIA in the last 168 hours. CBC: Recent Labs  Lab 11/04/20 1938 11/04/20 1948 11/05/20 0216 11/06/20 0026  WBC  --   6.0 5.1 5.8  NEUTROABS  --  3.5  --  3.7  HGB 13.6 13.9 13.0 14.0  HCT 40.0 41.8 37.6 39.8  MCV  --  91.9 91.0 89.8  PLT  --  181 170 169   Cardiac Enzymes: No results for input(s): CKTOTAL, CKMB, CKMBINDEX, TROPONINI in the last 168 hours. BNP: Invalid input(s): POCBNP CBG: Recent Labs  Lab 11/04/20 1813  GLUCAP 80   D-Dimer No results for input(s): DDIMER in the last 72 hours. Hgb A1c No results for input(s): HGBA1C in the last 72 hours. Lipid Profile No results for input(s): CHOL, HDL, LDLCALC, TRIG, CHOLHDL, LDLDIRECT in the last 72 hours. Thyroid function studies No results for input(s): TSH, T4TOTAL, T3FREE, THYROIDAB in the last 72 hours.  Invalid input(s): FREET3 Anemia work up No results for input(s): VITAMINB12, FOLATE, FERRITIN, TIBC, IRON, RETICCTPCT in the last 72 hours. Urinalysis    Component Value Date/Time   COLORURINE STRAW (A) 11/05/2020 0030   APPEARANCEUR CLEAR 11/05/2020 0030   LABSPEC 1.008 11/05/2020 0030   PHURINE 7.0 11/05/2020 0030   GLUCOSEU NEGATIVE 11/05/2020 0030   HGBUR NEGATIVE 11/05/2020 0030   BILIRUBINUR NEGATIVE 11/05/2020 0030   BILIRUBINUR NEG 02/19/2020 1514   KETONESUR NEGATIVE 11/05/2020 0030   PROTEINUR NEGATIVE 11/05/2020 0030   UROBILINOGEN 0.2 02/19/2020 1514   UROBILINOGEN 0.2 07/01/2015 1843   NITRITE NEGATIVE 11/05/2020 0030   LEUKOCYTESUR NEGATIVE 11/05/2020 0030   Sepsis Labs Invalid input(s): PROCALCITONIN,  WBC,  LACTICIDVEN Microbiology Recent Results (from the past 240 hour(s))  Resp Panel by RT-PCR (Flu A&B, Covid) Nasopharyngeal Swab     Status: None   Collection Time: 11/04/20 10:29 PM   Specimen: Nasopharyngeal Swab; Nasopharyngeal(NP) swabs in vial transport medium  Result Value Ref Range Status   SARS Coronavirus 2 by RT PCR NEGATIVE NEGATIVE Final    Comment: (NOTE) SARS-CoV-2 target nucleic acids are NOT DETECTED.  The SARS-CoV-2 RNA is generally detectable in upper respiratory specimens during  the acute phase of infection. The lowest concentration of SARS-CoV-2 viral copies this assay can detect is 138 copies/mL. A negative result does not preclude SARS-Cov-2 infection and should not be used as the sole basis for treatment or other patient management decisions. A negative result may occur with  improper specimen collection/handling, submission of specimen other than nasopharyngeal swab, presence of viral mutation(s) within the areas targeted by this assay, and inadequate number of viral copies(<138 copies/mL). A negative result must be combined with clinical observations, patient history, and epidemiological information. The expected result is Negative.  Fact Sheet for Patients:  EntrepreneurPulse.com.au  Fact Sheet for Healthcare Providers:  IncredibleEmployment.be  This test is no t yet approved or cleared by the [place] FDA and  has been authorized for detection and/or diagnosis of SARS-CoV-2 by FDA under an Emergency Use Authorization (EUA). This EUA will remain  in effect (meaning this test can be used) for the duration of the COVID-19 declaration under Section 564(b)(1) of the Act, 21 U.S.C.section 360bbb-3(b)(1), unless the authorization is terminated  or revoked sooner.       Influenza A by PCR NEGATIVE NEGATIVE Final   Influenza B by PCR NEGATIVE NEGATIVE  Final    Comment: (NOTE) The Xpert Xpress SARS-CoV-2/FLU/RSV plus assay is intended as an aid in the diagnosis of influenza from Nasopharyngeal swab specimens and should not be used as a sole basis for treatment. Nasal washings and aspirates are unacceptable for Xpert Xpress SARS-CoV-2/FLU/RSV testing.  Fact Sheet for Patients: EntrepreneurPulse.com.au  Fact Sheet for Healthcare Providers: IncredibleEmployment.be  This test is not yet approved or cleared by the [place] FDA and has been authorized for detection and/or  diagnosis of SARS-CoV-2 by FDA under an Emergency Use Authorization (EUA). This EUA will remain in effect (meaning this test can be used) for the duration of the COVID-19 declaration under Section 564(b)(1) of the Act, 21 U.S.C. section 360bbb-3(b)(1), unless the authorization is terminated or revoked.  Performed at [facility], [place] [address], [place], [place] [address]   MRSA PCR Screening     Status: None   Collection Time: 11/05/20 11:34 PM   Specimen: Nasal Mucosa; Nasopharyngeal  Result Value Ref Range Status   MRSA by PCR NEGATIVE NEGATIVE Final    Comment:        The GeneXpert MRSA Assay (FDA approved for NASAL specimens only), is one component of a comprehensive MRSA colonization surveillance program. It is not intended to diagnose MRSA infection nor to guide or monitor treatment for MRSA infections. Performed at [facility], [place] [address], [place], [place] [address]      Time coordinating discharge: 35 minutes  SIGNED:   Rodena Goldmann, DO [facility] 11/08/2020, 10:45 AM  If 7PM-7AM, please contact night-coverage [email or web address]

## 2020-11-09 DIAGNOSIS — E785 Hyperlipidemia, unspecified: Secondary | ICD-10-CM | POA: Diagnosis present

## 2020-11-09 DIAGNOSIS — F3113 Bipolar disorder, current episode manic without psychotic features, severe: Secondary | ICD-10-CM | POA: Diagnosis present

## 2020-11-09 DIAGNOSIS — F419 Anxiety disorder, unspecified: Secondary | ICD-10-CM

## 2020-11-09 DIAGNOSIS — F319 Bipolar disorder, unspecified: Secondary | ICD-10-CM

## 2020-11-09 DIAGNOSIS — R739 Hyperglycemia, unspecified: Secondary | ICD-10-CM | POA: Insufficient documentation

## 2020-11-09 LAB — COMPREHENSIVE METABOLIC PANEL
ALT: 17 U/L (ref 0–44)
AST: 21 U/L (ref 15–41)
Albumin: 4 g/dL (ref 3.5–5.0)
Alkaline Phosphatase: 45 U/L (ref 38–126)
Anion gap: 9 (ref 5–15)
BUN: 15 mg/dL (ref 6–20)
CO2: 21 mmol/L — ABNORMAL LOW (ref 22–32)
Calcium: 9.3 mg/dL (ref 8.9–10.3)
Chloride: 109 mmol/L (ref 98–111)
Creatinine, Ser: 0.91 mg/dL (ref 0.44–1.00)
GFR, Estimated: >60 mL/min (ref >60)
Glucose, Bld: 114 mg/dL — ABNORMAL HIGH (ref 70–99)
Potassium: 3.8 mmol/L (ref 3.5–5.1)
Sodium: 139 mmol/L (ref 135–145)
Total Bilirubin: 0.5 mg/dL (ref 0.3–1.2)
Total Protein: 7.6 g/dL (ref 6.5–8.1)

## 2020-11-09 LAB — LIPID PANEL
Cholesterol: 209 mg/dL — ABNORMAL HIGH (ref 0–200)
HDL: 32 mg/dL — ABNORMAL LOW (ref >40)
LDL Cholesterol: 147 mg/dL — ABNORMAL HIGH (ref 0–99)
Total CHOL/HDL Ratio: 6.5 RATIO
Triglycerides: 152 mg/dL — ABNORMAL HIGH (ref <150)
VLDL: 30 mg/dL (ref 0–40)

## 2020-11-09 LAB — HEMOGLOBIN A1C
Hgb A1c MFr Bld: 4.9 % (ref 4.8–5.6)
Mean Plasma Glucose: 93.93 mg/dL

## 2020-11-09 MED ORDER — WHITE PETROLATUM EX OINT
TOPICAL_OINTMENT | CUTANEOUS | Status: AC
  Administered 2020-11-09: 1
  Filled 2020-11-09: qty 5

## 2020-11-09 MED ORDER — IBUPROFEN 400 MG PO TABS
400.0000 mg | ORAL_TABLET | Freq: Three times a day (TID) | ORAL | Status: DC | PRN
  Administered 2020-11-09 – 2020-11-12 (×6): 400 mg via ORAL
  Filled 2020-11-09 (×6): qty 1

## 2020-11-09 MED ORDER — BENZOCAINE 10 % MT GEL
Freq: Three times a day (TID) | OROMUCOSAL | Status: DC | PRN
  Administered 2020-11-09 – 2020-11-10 (×2): 1 via OROMUCOSAL
  Filled 2020-11-09: qty 9

## 2020-11-09 MED ORDER — TRAZODONE HCL 50 MG PO TABS
50.0000 mg | ORAL_TABLET | Freq: Every evening | ORAL | Status: DC | PRN
Start: 2020-11-09 — End: 2020-11-12
  Administered 2020-11-09 – 2020-11-11 (×4): 50 mg via ORAL
  Filled 2020-11-09 (×9): qty 1

## 2020-11-09 NOTE — Progress Notes (Signed)
Adult Psychoeducational Group Note  Date:  11/09/2020 Time:  9:19 PM  Group Topic/Focus:  Wrap-Up Group:   The focus of this group is to help patients review their daily goal of treatment and discuss progress on daily workbooks.  Participation Level:  Active  Participation Quality:  Appropriate  Affect:  Appropriate  Cognitive:  Oriented  Insight: Appropriate  Engagement in Group:  Engaged  Modes of Intervention:  Education and Socialization  Additional Comments:  Patient attended and participated in group tonight. She reports that today she felt better was significant to her.  Salley [facility] 11/09/2020, 9:19 PM
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
## 2020-11-09 NOTE — [facility] Group Notes (Signed)
Psychoeducational Group Note    Date:11/09/2020 Time: 1300-1400    Life Skills:  A group where two lists are made. What people need and what are things that we do that are healthy. The lists are developed by the patients and it is explained that we often do the actions that are not healthy to get our list of needs met.   Purpose of Group: . The group focus' on teaching patients on how to identify their needs and how to develop the coping skills needed to get their needs met  Participation Level:  Active  Participation Quality:  Appropriate  Affect:  Appropriate  Cognitive:  Oriented  Insight:  Improving  Engagement in Group:  Engaged  Additional Comments:  Rates her energy at a 5/10  ,  A   

## 2020-11-09 NOTE — H&P (Addendum)
Psychiatric Admission Assessment Adult  Patient Identification: [name] [name] MRN:  [id number] Date of Evaluation:  11/09/2020 Chief Complaint:  Drug overdose, intentional ([place]) [T50.902A] MDD (major depressive disorder), recurrent episode, severe ([place]) [F33.2] Principal Diagnosis: Bipolar disorder, unspecified ([place]) Diagnosis:  Principal Problem:   Bipolar disorder, unspecified ([place]) Active Problems:   Migraine   Drug overdose, intentional ([place])   Hyperlipidemia   Anxiety disorder, unspecified  History of Present Illness: Female  60 years old admitted to the unit last evening from [facility].  Patient was admitted to the inpatient unit at [facility] after she was found unresponsive by her children.  Patient had OD on Xanax.  Today patient participated in this interview and reports that she OD on Xanax and Phenergan and two other medications of which she could not remember the names after her children became angry at her for not managing her money well.  She admits that this OD is her third attempt with medication as a suicide attempt.  She as hospitalized in the unit  From august 2nd 2021- August 5th 2021 for treatment of Depression and OD.  She could not explain further what she OD on at the time .  Patient reports that she lost $8, 000:00  she gave to a random man she met in the Internet. Patient admits to previous diagnosis of Bipolar disorder and states she has been tried on multiple  Medications in the past..  She only could name Cymbalta but later informed DR Nelda Marseille that she has tried Depakote, Lamictal in the past.  She reports that this is her second inpatient Psychiatric unit.  Patient rates her depression the past two weeks 10/10- with 10 being worst depression.   She reports that she realized she was talking to a fake person and already sent out money got her very depressed.  She reports no issues with sleep but poor appetite.  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  She states she has been been taking medications for a while but was engaged in Therapy.  As self employed she did not know how and when her insurance stopped and she could not continue with therapy.  She lost her long time therapist she bonded with due to retirement.  Patient has insurance now and will be looking forward to a new therapist.  She denies using illicit drugs or alcohol but she states her children are trying to send her to a treatment facility.  Patient could not explain what type of facility her children are arranging for her to go to.  Patient denies feeling suicidal today and no thoughts of wanting to hurt herself.  She is currently on Cymbalta and Topamax which she claims is for Migraine headache.  Patient has been encouraged to participate on various group activities offered in the unit.  We will continue to monitor her mental status and round on her daily.  Associated Signs/Symptoms: Depression Symptoms:  depressed mood, fatigue, feelings of worthlessness/guilt, hopelessness, decreased appetite, Duration of Depression Symptoms: No data recorded (Hypo) Manic Symptoms:  na Anxiety Symptoms:  Excessive Worry, anxious due to lossing money to unknown person Psychotic Symptoms:  denies Duration of Psychotic Symptoms: No data recorded PTSD Symptoms: NA Total Time spent with patient: 50 minutes  Past Psychiatric History: Bipolar D/o, Depression.  Third suicide attempts, two inpatient hospitalization in [facility]  [facility]  Is the patient at risk to self? No.  Has the patient been a risk to self  in the past 6 months? Yes.    Has the patient been a risk to self within the distant past? Yes.    Is the patient a risk to others? No.  Has the patient been a risk to others in the past 6 months? No.  Has the patient been a risk to others within the distant past? No.   Prior Inpatient Therapy:   Prior Outpatient Therapy:    Alcohol Screening:   Substance Abuse History in the  last 12 months:  No. Consequences of Substance Abuse: NA Previous Psychotropic Medications: Yes  Psychological Evaluations: No  Past Medical History:  Past Medical History:  Diagnosis Date  . Anxiety   . Arthritis   . Helicobacter pylori (H. pylori) 07/2010  . Migraine     Past Surgical History:  Procedure Laterality Date  . CATARACT EXTRACTION     bilateral  . CERVICAL FUSION    . TUBAL LIGATION    . UPPER GASTROINTESTINAL ENDOSCOPY    . VAGINAL HYSTERECTOMY  2001   USO by history-sono2011 question of both ovaries present   Family History:  Family History  Adopted: Yes  Problem Relation Age of Onset  . Hypertension Mother   . Mental illness Mother   . Hypertension Brother    Family Psychiatric  History: Mother suffered from unknown mental illness,per her report it appears her mother had addiction problem because she was getting medications from different doctors at the same time. Tobacco Screening:  Not smoking Social History:  Social History   Substance and Sexual Activity  Alcohol Use Yes  . Alcohol/week: 0.0 standard drinks   Comment: Very rare     Social History   Substance and Sexual Activity  Drug Use Yes  . Types: Benzodiazepines    Additional Social History:                           Allergies:  No Known Allergies Lab Results:  Results for orders placed or performed during the hospital encounter of 11/08/20 (from the past 48 hour(s))  Lipid panel     Status: Abnormal   Collection Time: 11/09/20  6:47 AM  Result Value Ref Range   Cholesterol 209 (H) 0 - 200 mg/dL   Triglycerides 152 (H) <150 mg/dL   HDL 32 (L) >40 mg/dL   Total CHOL/HDL Ratio 6.5 RATIO   VLDL 30 0 - 40 mg/dL   LDL Cholesterol 147 (H) 0 - 99 mg/dL    Comment:        Total Cholesterol/HDL:CHD Risk Coronary Heart Disease Risk Table                     Men   Women  1/2 Average Risk   3.4   3.3  Average Risk       5.0   4.4  2 X Average Risk   9.6   7.1  3 X Average  Risk  23.4   11.0        Use the calculated Patient Ratio above and the CHD Risk Table to determine the patient's CHD Risk.        ATP III CLASSIFICATION (LDL):  <100     mg/dL   Optimal  100-129  mg/dL   Near or Above                    Optimal  130-159  mg/dL   Borderline  160-189  mg/dL   High  >190     mg/dL   Very High Performed at [facility] [address], [place], [place] [address]   Comprehensive metabolic panel     Status: Abnormal   Collection Time: 11/09/20  6:47 AM  Result Value Ref Range   Sodium 139 135 - 145 mmol/L   Potassium 3.8 3.5 - 5.1 mmol/L   Chloride 109 98 - 111 mmol/L   CO2 21 (L) 22 - 32 mmol/L   Glucose, Bld 114 (H) 70 - 99 mg/dL    Comment: Glucose reference range applies only to samples taken after fasting for at least 8 hours.   BUN 15 6 - 20 mg/dL   Creatinine, Ser 0.91 0.44 - 1.00 mg/dL   Calcium 9.3 8.9 - 10.3 mg/dL   Total Protein 7.6 6.5 - 8.1 g/dL   Albumin 4.0 3.5 - 5.0 g/dL   AST 21 15 - 41 U/L   ALT 17 0 - 44 U/L   Alkaline Phosphatase 45 38 - 126 U/L   Total Bilirubin 0.5 0.3 - 1.2 mg/dL   GFR, Estimated >60 >60 mL/min    Comment: (NOTE) Calculated using the CKD-EPI Creatinine Equation (2021)    Anion gap 9 5 - 15    Comment: Performed at [facility], [place] [address] [place], [place] [address]  Hemoglobin A1c     Status: None   Collection Time: 11/09/20  6:47 AM  Result Value Ref Range   Hgb A1c MFr Bld 4.9 4.8 - 5.6 %    Comment: (NOTE) Pre diabetes:          5.7%-6.4%  Diabetes:              >6.4%  Glycemic control for   <7.0% adults with diabetes    Mean Plasma Glucose 93.93 mg/dL    Comment: Performed at [facility] [address], [place], [place] [address]    Blood Alcohol level:  Lab Results  Component Value Date   [facility] <10 11/04/2020   ETH <10 48/88/9169    Metabolic Disorder Labs:  Lab Results  Component Value Date   HGBA1C 4.9 11/09/2020   MPG 93.93  11/09/2020   MPG 105 02/05/2014   No results found for: PROLACTIN Lab Results  Component Value Date   CHOL 209 (H) 11/09/2020   TRIG 152 (H) 11/09/2020   HDL 32 (L) 11/09/2020   CHOLHDL 6.5 11/09/2020   VLDL 30 11/09/2020   LDLCALC 147 (H) 11/09/2020   LDLCALC 116 (H) 06/03/2020    Current Medications: Current Facility-Administered Medications  Medication Dose Route Frequency Provider Last Rate Last Admin  . acetaminophen (TYLENOL) tablet 650 mg  650 mg Oral Q6H PRN Prescilla Sours, PA-C   650 mg at 11/09/20 0817  . alum & mag hydroxide-simeth (MAALOX/MYLANTA) 200-200-20 MG/5ML suspension 30 mL  30 mL Oral Q4H PRN Margorie John W, PA-C      . benzocaine (ORAJEL) 10 % mucosal gel   Mouth/Throat TID PRN Delfin Gant, NP   1 application at 45/03/88 1233  . DULoxetine (CYMBALTA) DR capsule 40 mg  40 mg Oral Daily Margorie John W, PA-C   40 mg at 11/09/20 0818  . hydrOXYzine (ATARAX/VISTARIL) tablet 25 mg  25 mg Oral TID PRN Prescilla Sours, PA-C   25 mg at 11/09/20 8280  . ibuprofen (ADVIL) tablet 400 mg  400 mg Oral Q8H PRN Delfin Gant, NP  400 mg at 11/09/20 1231  . magnesium hydroxide (MILK OF MAGNESIA) suspension 30 mL  30 mL Oral Daily PRN Margorie John W, PA-C      . rosuvastatin (CRESTOR) tablet 5 mg  5 mg Oral Daily Margorie John W, PA-C   5 mg at 11/09/20 0818  . topiramate (TOPAMAX) tablet 200 mg  200 mg Oral BID Prescilla Sours, PA-C   200 mg at 11/09/20 0818  . traZODone (DESYREL) tablet 50 mg  50 mg Oral QHS PRN Prescilla Sours, PA-C   50 mg at 11/08/20 2135   PTA Medications: Medications Prior to Admission  Medication Sig Dispense Refill Last Dose  . acetaminophen (TYLENOL) 500 MG tablet Take 500 mg by mouth every 6 (six) hours as needed for headache.     . bisacodyl (DULCOLAX) 5 MG EC tablet Take 1 tablet (5 mg total) by mouth daily as needed. (May buy from over the counter): For constipation (Patient not taking: No sig reported) 1 tablet 0   . DULoxetine 40 MG  CPEP Take 40 mg by mouth daily. For depression (Patient not taking: No sig reported) 30 capsule 0   . meloxicam (MOBIC) 15 MG tablet Take 1 tablet (15 mg total) by mouth daily. (Patient not taking: No sig reported) 90 tablet 2   . polyethylene glycol (MIRALAX / GLYCOLAX) 17 g packet Take 17 g by mouth daily as needed. (May buy from over the counter): For constipation (Patient not taking: No sig reported) 14 each 0   . rosuvastatin (CRESTOR) 5 MG tablet Take 1 tablet (5 mg total) by mouth daily. 36 tablet 3   . topiramate (TOPAMAX) 200 MG tablet Take 1 tablet (200 mg total) by mouth 2 (two) times daily. For mood stabilization 30 tablet 0     Musculoskeletal: Strength & Muscle Tone: within normal limits Gait & Station: normal Patient leans: Front  Psychiatric Specialty Exam: Physical Exam Vitals and nursing note reviewed.  Constitutional:      Appearance: Normal appearance.  HENT:     Head: Normocephalic and atraumatic.  Cardiovascular:     Rate and Rhythm: Normal rate and regular rhythm.     Pulses: Normal pulses.  Pulmonary:     Effort: Pulmonary effort is normal.  Musculoskeletal:     Cervical back: Normal range of motion.  Skin:    General: Skin is warm.  Neurological:     General: No focal deficit present.     Mental Status: She is alert.     Review of Systems  Constitutional: Negative.   HENT: Negative.   Eyes: Negative.   Respiratory: Negative.   Gastrointestinal: Negative.   Allergic/Immunologic: Negative.   Neurological: Negative.   Hematological: Negative.   Psychiatric/Behavioral: Negative.     Blood pressure (!) 109/93, pulse 93, temperature 97.8 F (36.6 C), temperature source Oral, resp. rate 20, height '5\' 1"'  (1.549 m), weight 59 kg, last menstrual period 11/02/2000, SpO2 99 %.Body mass index is 24.56 kg/m.  General Appearance: Casual and Fairly Groomed  Eye Contact:  Good  Speech:  Clear and Coherent  Volume:  Normal  Mood:  Anxious and Depressed   Affect:  Congruent  Thought Process:  Coherent  Orientation:  Full (Time, Place, and Person)  Thought Content:  Logical  Suicidal Thoughts:  No  Homicidal Thoughts:  No  Memory:  Immediate;   Good Recent;   Good Remote;   Good  Judgement:  Fair  Insight:  Fair  Psychomotor Activity:  Normal  Concentration:  Concentration: Good  Recall:  [place] of Knowledge:  Good  Language:  Good  Akathisia:  NA  Handed:  Right  AIMS (if indicated):     Assets:  Communication Skills Desire for Improvement Financial Resources/Insurance Housing Leisure Time Physical Health Resilience Transportation  ADL's:  Intact  Cognition:  WNL  Sleep:  Number of Hours: 6.75    Treatment Plan Summary: Daily contact with patient to assess and evaluate symptoms and progress in treatment -Continue  Cymbalta 40 mg po daily for depression -Continue Topamax 200 mg po bid for Migraine headache/Mood stabilization -Crestor 5 mg po daily for Hyperlipidemia -Offer Trazodone 50 mg po at bed time for sleep as needed -Offer Hydroxyzine 25 mg po 3 times a day as needed for anxiety -Encourage group and activity participation. Observation Level/Precautions:  15 minute checks  Laboratory:  lab work up are wnl except Lipid panels are elevated with slight elevation of Blood Glucose.  Psychotherapy:  Encouraged to participate  Medications:  See MAR  Consultations:  NA  Discharge Concerns:  Safety and suicide attempt concerns, medication compopliance  Estimated LOS:3-5 days  Other:     Physician Treatment Plan for Primary Diagnosis: Bipolar disorder, unspecified ([place]) Long Term Goal(s): Improvement in symptoms so as ready for discharge  Short Term Goals: Ability to identify changes in lifestyle to reduce recurrence of condition will improve, Ability to verbalize feelings will improve, Ability to disclose and discuss suicidal ideas, Ability to demonstrate self-control will improve, Ability to identify and develop  effective coping behaviors will improve, Ability to maintain clinical measurements within normal limits will improve, Compliance with prescribed medications will improve and Ability to identify triggers associated with substance abuse/mental health issues will improve  Physician Treatment Plan for Secondary Diagnosis: Principal Problem:   Bipolar disorder, unspecified ([place]) Active Problems:   Migraine   Drug overdose, intentional ([place])   Hyperlipidemia   Anxiety disorder, unspecified  Long Term Goal(s): Improvement in symptoms so as ready for discharge  Short Term Goals: Ability to identify changes in lifestyle to reduce recurrence of condition will improve, Ability to verbalize feelings will improve, Ability to disclose and discuss suicidal ideas, Ability to demonstrate self-control will improve, Ability to identify and develop effective coping behaviors will improve, Ability to maintain clinical measurements within normal limits will improve, Compliance with prescribed medications will improve and Ability to identify triggers associated with substance abuse/mental health issues will improve  I certify that inpatient services furnished can reasonably be expected to improve the patient's condition.    Harlow Asa, MD   PMHNP-BC  I have independently evaluated the patient during a face-to-face assessment on 11/09/20. I reviewed the patient's chart, and I participated in key portions of the service. I discussed the case with the APP, and I agree with the assessment and plan of care as documented in the APP's note. See SRA for my assessment. Viann Fish, MD, FAPA  1/8/20222:29 PM

## 2020-11-09 NOTE — Progress Notes (Signed)
[place] NOVEL CORONAVIRUS (COVID-19) DAILY CHECK-OFF SYMPTOMS - answer yes or no to each - every day NO YES  Have you had a fever in the past 24 hours?  . Fever (Temp > 37.80C / 100F) X   Have you had any of these symptoms in the past 24 hours? . New Cough .  Sore Throat  .  Shortness of Breath .  Difficulty Breathing .  Unexplained Body Aches   X   Have you had any one of these symptoms in the past 24 hours not related to allergies?   . Runny Nose .  Nasal Congestion .  Sneezing   X   If you have had runny nose, nasal congestion, sneezing in the past 24 hours, has it worsened?  X   EXPOSURES - check yes or no X   Have you traveled outside the state in the past 14 days?  X   Have you been in contact with someone with a confirmed diagnosis of COVID-19 or PUI in the past 14 days without wearing appropriate PPE?  X   Have you been living in the same home as a person with confirmed diagnosis of COVID-19 or a PUI (household contact)?    X   Have you been diagnosed with COVID-19?    X              What to do next: Answered NO to all: Answered YES to anything:   Proceed with unit schedule Follow the [facility] Inpatient Flowsheet.   

## 2020-11-09 NOTE — Progress Notes (Signed)
   11/09/20 1300  Psych Admission Type (Psych Patients Only)  Admission Status Voluntary  Psychosocial Assessment  Patient Complaints Depression  Eye Contact Fair  Facial Expression Sad;Flat  Affect Sad  Speech Logical/coherent;Soft  Interaction Assertive  Motor Activity Other (Comment) (WDL)  Appearance/Hygiene Unremarkable  Behavior Characteristics Cooperative  Mood Depressed  Thought Process  Coherency WDL  Content WDL  Delusions None reported or observed  Perception WDL  Hallucination None reported or observed  Judgment WDL  Confusion None  Danger to Self  Current suicidal ideation? Denies  Danger to Others  Danger to Others None reported or observed

## 2020-11-09 NOTE — Progress Notes (Signed)
   11/09/20 2217  COVID-19 Daily Checkoff  Have you had a fever (temp > 37.80C/100F)  in the past 24 hours?  No  If you have had runny nose, nasal congestion, sneezing in the past 24 hours, has it worsened? No  COVID-19 EXPOSURE  Have you traveled outside the state in the past 14 days? No  Have you been in contact with someone with a confirmed diagnosis of COVID-19 or PUI in the past 14 days without wearing appropriate PPE? No  Have you been living in the same home as a person with confirmed diagnosis of COVID-19 or a PUI (household contact)? No  Have you been diagnosed with COVID-19? No
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
## 2020-11-09 NOTE — Progress Notes (Signed)
   11/09/20 2221  Psych Admission Type (Psych Patients Only)  Admission Status Voluntary  Psychosocial Assessment  Patient Complaints Depression  Eye Contact Fair  Affect Appropriate to circumstance  Speech Logical/coherent  Interaction Assertive  Motor Activity Other (Comment) (WDL)  Appearance/Hygiene Unremarkable  Behavior Characteristics Appropriate to situation  Mood Depressed  Thought Process  Coherency WDL  Content WDL  Delusions None reported or observed  Perception WDL  Hallucination None reported or observed  Judgment WDL  Confusion None  Danger to Self  Current suicidal ideation? Denies  Danger to Others  Danger to Others None reported or observed
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
## 2020-11-09 NOTE — [facility] Group Notes (Signed)
.  Psychoeducational Group Note  Date: 11-09-20 Time: 0900-1000    Goal Setting   Purpose of Group: This group helps to provide patients with the steps of setting a goal that is specific, measurable, attainable, realistic and time specific. A discussion on how we keep ourselves stuck with negative self talk.    Participation Level:  Active  Participation Quality:  Appropriate  Affect:  Appropriate  Cognitive:  Appropriate  Insight:  Improving  Engagement in Group:  Engaged  Additional Comments:  Rates her energy a a 5/10  Bryson Dames A

## 2020-11-10 ENCOUNTER — Encounter (HOSPITAL_COMMUNITY): Payer: Self-pay | Admitting: Family

## 2020-11-10 DIAGNOSIS — F319 Bipolar disorder, unspecified: Principal | ICD-10-CM

## 2020-11-10 DIAGNOSIS — F419 Anxiety disorder, unspecified: Secondary | ICD-10-CM

## 2020-11-10 DIAGNOSIS — F4310 Post-traumatic stress disorder, unspecified: Secondary | ICD-10-CM

## 2020-11-10 DIAGNOSIS — E785 Hyperlipidemia, unspecified: Secondary | ICD-10-CM

## 2020-11-10 DIAGNOSIS — T50902S Poisoning by unspecified drugs, medicaments and biological substances, intentional self-harm, sequela: Secondary | ICD-10-CM

## 2020-11-10 DIAGNOSIS — G43001 Migraine without aura, not intractable, with status migrainosus: Secondary | ICD-10-CM

## 2020-11-10 MED ORDER — DULOXETINE HCL 60 MG PO CPEP
60.0000 mg | ORAL_CAPSULE | Freq: Every day | ORAL | Status: DC
  Administered 2020-11-11 – 2020-11-13 (×3): 60 mg via ORAL
  Filled 2020-11-10 (×6): qty 1

## 2020-11-10 NOTE — Progress Notes (Signed)
[place] NOVEL CORONAVIRUS (COVID-19) DAILY CHECK-OFF SYMPTOMS - answer yes or no to each - every day NO YES  Have you had a fever in the past 24 hours?  . Fever (Temp > 37.80C / 100F) X   Have you had any of these symptoms in the past 24 hours? . New Cough .  Sore Throat  .  Shortness of Breath .  Difficulty Breathing .  Unexplained Body Aches   X   Have you had any one of these symptoms in the past 24 hours not related to allergies?   . Runny Nose .  Nasal Congestion .  Sneezing   X   If you have had runny nose, nasal congestion, sneezing in the past 24 hours, has it worsened?  X   EXPOSURES - check yes or no X   Have you traveled outside the state in the past 14 days?  X   Have you been in contact with someone with a confirmed diagnosis of COVID-19 or PUI in the past 14 days without wearing appropriate PPE?  X   Have you been living in the same home as a person with confirmed diagnosis of COVID-19 or a PUI (household contact)?    X   Have you been diagnosed with COVID-19?    X              What to do next: Answered NO to all: Answered YES to anything:   Proceed with unit schedule Follow the [facility] Inpatient Flowsheet.   
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
## 2020-11-10 NOTE — Progress Notes (Signed)
   11/10/20 1500  Psych Admission Type (Psych Patients Only)  Admission Status Voluntary  Psychosocial Assessment  Patient Complaints Depression  Eye Contact Fair  Facial Expression Sad;Flat  Affect Appropriate to circumstance  Speech Logical/coherent  Interaction Assertive  Motor Activity Other (Comment) (WDL)  Appearance/Hygiene Unremarkable  Behavior Characteristics Cooperative;Appropriate to situation  Mood Depressed;Anxious  Thought Process  Coherency WDL  Content WDL  Delusions None reported or observed  Perception WDL  Hallucination None reported or observed  Judgment WDL  Confusion None  Danger to Self  Current suicidal ideation? Denies  Danger to Others  Danger to Others None reported or observed

## 2020-11-10 NOTE — Plan of Care (Signed)
Patient stayed in the milieu and participated in scheduled activities. Pleasant on approach and denying thoughts of self-harm. Complained of anxiety and received Vistaril. Continues to complain of tooth aching and used Oragel at bedtime. Patient went to bed and  has just fallen asleep. Safety monitored as expected.

## 2020-11-10 NOTE — Progress Notes (Signed)
Patient ID: [name] [name], female   DOB: 09-11-1961, 60 y.o.   MRN: [id number]   Discussed case with MD, Dr. Leverne Humbles. It was recommended that Cymbalta be increased to 60 mg po daily. Adjustments made and reflected in MAR.

## 2020-11-10 NOTE — [facility] Group Notes (Signed)
[place] LCSW Group Therapy Note  11/10/2020    Type of Therapy and Topic:  Group Therapy:  Adding Supports Including Yourself  Participation Level:  Active   Description of Group:   Patients in this group were introduced to the concept that additional supports including self-support are an essential part of recovery.  Patients listed what supports they believe they need to add to their lives to achieve their goals at discharge, and they listed such things as therapist, family, doctor, support groups, 12-step groups and service animals.   A song entitled "My Own Hero" was played and a group discussion ensued in which patients stated they could relate to the song and it inspired them to realize they have be willing to help themselves in order to succeed, because other people cannot achieve sobriety or stability for them.  Additional songs were played ("Fight For It" then "I Am Enough") to encourage patients toward self-advocacy and self-support as part of their recovery.  They discussed their reactions to these songs' messages, which were positive and hopeful.  Therapeutic Goals: 1)  demonstrate the importance of being a key part of one's own support system 2)  discuss various available supports 3)  encourage patient to use music as part of their self-support and focus on goals 4)  elicit ideas from patients about supports that need to be added   Summary of Patient Progress:  The patient expressed that supports she needs to add at discharge include supports groups with peers and a therapist.  Her reaction to the various songs played was very tearful and she stated "I can't listen to music when I'm depressed."  CSW tried to explore what type of music could actually work to her benefit, but she was unengaged.  Therapeutic Modalities:   Motivational Interviewing Activity  Maretta Los
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
## 2020-11-10 NOTE — [facility] Group Notes (Signed)
Adult Psychoeducational Group Not Date:  11/10/2020 Time:  0900-1045 Group Topic/Focus: PROGRESSIVE RELAXATION. A group where deep breathing is taught and tensing and relaxation muscle groups is used. Imagery is used as well.  Pts are asked to imagine 3 pillars that hold them up when they are not able to hold themselves up.  Participation Level:  Active  Participation Quality:  Appropriate  Affect:  Appropriate  Cognitive:  Oriented  Insight: Improving  Engagement in Group:  Engaged  Modes of Intervention:  Activity, Discussion, Education, and Support  Additional Comments:  Rates her energy as a 8/10. States work, music and her best friend hold her up  Paulino Rily 11/10/2020

## 2020-11-11 DIAGNOSIS — F3112 Bipolar disorder, current episode manic without psychotic features, moderate: Secondary | ICD-10-CM

## 2020-11-11 LAB — RESP PANEL BY RT-PCR (RSV, FLU A&B, COVID)  RVPGX2
Influenza A by PCR: NEGATIVE
Influenza B by PCR: NEGATIVE
Resp Syncytial Virus by PCR: NEGATIVE
SARS Coronavirus 2 by RT PCR: NEGATIVE

## 2020-11-11 NOTE — Progress Notes (Signed)
Recreation Therapy Notes  Date:  1.10.22 Time: 0930 Location: [address]   Group Topic: Stress Management  Goal Area(s) Addresses:  Patient will identify positive stress management techniques. Patient will identify benefits of using stress management post d/c.  Intervention: Worksheets  ActivityMerchandiser, retail.  Due to COVID precautions on unit, patients were given a packet that included worksheets to help with challenging anxious thoughts, setting life goals, positive steps to wellbeing, forgiveness and crossword puzzles/word searches.    Education:  Stress Management, Discharge Planning.   Education Outcome: Acknowledges Education  Clinical Observations/Feedback: Pt was asleep, packet given to tech on the unit.    Victorino Sparrow, LRT/CTRS         Victorino Sparrow A 11/11/2020 12:22 PM

## 2020-11-11 NOTE — Progress Notes (Signed)
D: Patient presents with sad affect but is pleasant and appropriate upon assessment and interaction. Patient reports that her day was okay. Patient denies SI/HI at this time. Patient also denies AH/VH at this time. Patient contracts for safety.  A: Provided positive reinforcement and encouragement.  R: Patient cooperative and receptive to efforts. Patient remains safe on the unit.   11/11/20 2022  Psych Admission Type (Psych Patients Only)  Admission Status Voluntary  Psychosocial Assessment  Patient Complaints Depression;Sadness  Eye Contact Fair  Facial Expression Other (Comment) (Appropriate)  Affect Appropriate to circumstance  Speech Logical/coherent  Interaction Assertive  Motor Activity Other (Comment) (WDL)  Appearance/Hygiene Unremarkable  Behavior Characteristics Cooperative;Appropriate to situation  Mood Depressed  Thought Process  Coherency WDL  Content WDL  Delusions None reported or observed  Perception WDL  Hallucination None reported or observed  Judgment WDL  Confusion None  Danger to Self  Current suicidal ideation? Denies  Danger to Others  Danger to Others None reported or observed
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
## 2020-11-11 NOTE — [facility] Group Notes (Signed)
Occupational Therapy Group Note Date: 11/11/2020 Group Topic/Focus: Stress Management  Group Description: Group encouraged increased participation and engagement through discussion focused on topic of stress management. Patients engaged interactively to discuss components of stress including physical signs, emotional signs, negative management strategies, and positive management strategies. Each individual identified one new stress management strategy they would like to try moving forward.    Therapeutic Goals: Identify current stressors Identify healthy vs unhealthy stress management strategies/techniques Discuss and identify physical and emotional signs of stress  Participation Level: group delayed / not held due to COVID precautions on unit    Plan: Continue to engage patient in OT groups 2 - 3x/week.  11/11/2020  Ponciano Ort, MOT, OTR/L

## 2020-11-11 NOTE — Tx Team (Signed)
Interdisciplinary Treatment and Diagnostic Plan Update  11/11/2020 Time of Session: 9:05am [name] [name] MRN: [id number]  Principal Diagnosis: Bipolar disorder, unspecified ([name] [name])  Secondary Diagnoses: Principal Problem:   Bipolar disorder, unspecified ([name]) Active Problems:   Migraine   PTSD (post-traumatic stress disorder)   Drug overdose, intentional ([name])   Hyperlipidemia   Anxiety disorder, unspecified   Current Medications:  Current Facility-Administered Medications  Medication Dose Route Frequency Provider Last Rate Last Admin  . acetaminophen (TYLENOL) tablet 650 mg  650 mg Oral Q6H PRN Prescilla Sours, PA-C   650 mg at 11/09/20 0817  . alum & mag hydroxide-simeth (MAALOX/MYLANTA) 200-200-20 MG/5ML suspension 30 mL  30 mL Oral Q4H PRN Margorie John W, PA-C      . benzocaine (ORAJEL) 10 % mucosal gel   Mouth/Throat TID PRN Delfin Gant, NP   Given at 11/10/20 2118  . DULoxetine (CYMBALTA) DR capsule 60 mg  60 mg Oral Daily Mordecai Maes, NP   60 mg at 11/11/20 0740  . hydrOXYzine (ATARAX/VISTARIL) tablet 25 mg  25 mg Oral TID PRN Prescilla Sours, PA-C   25 mg at 11/11/20 0740  . ibuprofen (ADVIL) tablet 400 mg  400 mg Oral Q8H PRN Charmaine Downs C, NP   400 mg at 11/11/20 0740  . magnesium hydroxide (MILK OF MAGNESIA) suspension 30 mL  30 mL Oral Daily PRN Prescilla Sours, PA-C   30 mL at 11/11/20 0748  . rosuvastatin (CRESTOR) tablet 5 mg  5 mg Oral Daily Margorie John W, PA-C   5 mg at 11/11/20 0745  . topiramate (TOPAMAX) tablet 200 mg  200 mg Oral BID Prescilla Sours, PA-C   200 mg at 11/11/20 0745  . traZODone (DESYREL) tablet 50 mg  50 mg Oral QHS,MR X 1 Margorie John W, PA-C   50 mg at 11/10/20 2118   PTA Medications: Medications Prior to Admission  Medication Sig Dispense Refill Last Dose  . acetaminophen (TYLENOL) 500 MG tablet Take 500 mg by mouth every 6 (six) hours as needed for headache.     . bisacodyl (DULCOLAX) 5 MG EC tablet Take 1 tablet (5 mg  total) by mouth daily as needed. (May buy from over the counter): For constipation (Patient not taking: No sig reported) 1 tablet 0   . DULoxetine 40 MG CPEP Take 40 mg by mouth daily. For depression (Patient not taking: No sig reported) 30 capsule 0   . meloxicam (MOBIC) 15 MG tablet Take 1 tablet (15 mg total) by mouth daily. (Patient not taking: No sig reported) 90 tablet 2   . polyethylene glycol (MIRALAX / GLYCOLAX) 17 g packet Take 17 g by mouth daily as needed. (May buy from over the counter): For constipation (Patient not taking: No sig reported) 14 each 0   . rosuvastatin (CRESTOR) 5 MG tablet Take 1 tablet (5 mg total) by mouth daily. 36 tablet 3   . topiramate (TOPAMAX) 200 MG tablet Take 1 tablet (200 mg total) by mouth 2 (two) times daily. For mood stabilization 30 tablet 0     Patient Stressors: Marital or family conflict Substance abuse Traumatic event  Patient Strengths: Ability for insight Average or above average intelligence General fund of knowledge  Treatment Modalities: Medication Management, Group therapy, Case management,  1 to 1 session with clinician, Psychoeducation, Recreational therapy.   Physician Treatment Plan for Primary Diagnosis: Bipolar disorder, unspecified ([name]) Long Term Goal(s): Improvement in symptoms so as ready for discharge Improvement  in symptoms so as ready for discharge   Short Term Goals: Ability to identify changes in lifestyle to reduce recurrence of condition will improve Ability to verbalize feelings will improve Ability to disclose and discuss suicidal ideas Ability to demonstrate self-control will improve Ability to identify and develop effective coping behaviors will improve Ability to maintain clinical measurements within normal limits will improve Compliance with prescribed medications will improve Ability to identify triggers associated with substance abuse/mental health issues will improve Ability to identify changes in  lifestyle to reduce recurrence of condition will improve Ability to verbalize feelings will improve Ability to disclose and discuss suicidal ideas Ability to demonstrate self-control will improve Ability to identify and develop effective coping behaviors will improve Ability to maintain clinical measurements within normal limits will improve Compliance with prescribed medications will improve Ability to identify triggers associated with substance abuse/mental health issues will improve  Medication Management: Evaluate patient's response, side effects, and tolerance of medication regimen.  Therapeutic Interventions: 1 to 1 sessions, Unit Group sessions and Medication administration.  Evaluation of Outcomes: Progressing  Physician Treatment Plan for Secondary Diagnosis: Principal Problem:   Bipolar disorder, unspecified ([name]) Active Problems:   Migraine   PTSD (post-traumatic stress disorder)   Drug overdose, intentional ([name])   Hyperlipidemia   Anxiety disorder, unspecified  Long Term Goal(s): Improvement in symptoms so as ready for discharge Improvement in symptoms so as ready for discharge   Short Term Goals: Ability to identify changes in lifestyle to reduce recurrence of condition will improve Ability to verbalize feelings will improve Ability to disclose and discuss suicidal ideas Ability to demonstrate self-control will improve Ability to identify and develop effective coping behaviors will improve Ability to maintain clinical measurements within normal limits will improve Compliance with prescribed medications will improve Ability to identify triggers associated with substance abuse/mental health issues will improve Ability to identify changes in lifestyle to reduce recurrence of condition will improve Ability to verbalize feelings will improve Ability to disclose and discuss suicidal ideas Ability to demonstrate self-control will improve Ability to identify and develop  effective coping behaviors will improve Ability to maintain clinical measurements within normal limits will improve Compliance with prescribed medications will improve Ability to identify triggers associated with substance abuse/mental health issues will improve     Medication Management: Evaluate patient's response, side effects, and tolerance of medication regimen.  Therapeutic Interventions: 1 to 1 sessions, Unit Group sessions and Medication administration.  Evaluation of Outcomes: Progressing   RN Treatment Plan for Primary Diagnosis: Bipolar disorder, unspecified ([place]) Long Term Goal(s): Knowledge of disease and therapeutic regimen to maintain health will improve  Short Term Goals: Ability to remain free from injury will improve, Ability to participate in decision making will improve, Ability to verbalize feelings will improve, Ability to disclose and discuss suicidal ideas and Ability to identify and develop effective coping behaviors will improve  Medication Management: RN will administer medications as ordered by provider, will assess and evaluate patient's response and provide education to patient for prescribed medication. RN will report any adverse and/or side effects to prescribing provider.  Therapeutic Interventions: 1 on 1 counseling sessions, Psychoeducation, Medication administration, Evaluate responses to treatment, Monitor vital signs and CBGs as ordered, Perform/monitor CIWA, COWS, AIMS and Fall Risk screenings as ordered, Perform wound care treatments as ordered.  Evaluation of Outcomes: Progressing   LCSW Treatment Plan for Primary Diagnosis: Bipolar disorder, unspecified ([place]) Long Term Goal(s): Safe transition to appropriate next level of care at discharge, Engage patient  in therapeutic group addressing interpersonal concerns.  Short Term Goals: Engage patient in aftercare planning with referrals and resources, Increase social support, Increase emotional  regulation, Facilitate acceptance of mental health diagnosis and concerns, Identify triggers associated with mental health/substance abuse issues and Increase skills for wellness and recovery  Therapeutic Interventions: Assess for all discharge needs, 1 to 1 time with Social worker, Explore available resources and support systems, Assess for adequacy in community support network, Educate family and significant other(s) on suicide prevention, Complete Psychosocial Assessment, Interpersonal group therapy.  Evaluation of Outcomes: Progressing   Progress in Treatment: Attending groups: Yes. Participating in groups: Yes. Taking medication as prescribed: Yes. Toleration medication: Yes. Family/Significant other contact made: Yes, individual(s) contacted:  Daughter Patient understands diagnosis: Yes. and No. Discussing patient identified problems/goals with staff: Yes. Medical problems stabilized or resolved: Yes. Denies suicidal/homicidal ideation: Yes. Issues/concerns per patient self-inventory: No.   New problem(s) identified: No, Describe:  None  New Short Term/Long Term Goal(s): medication stabilization, elimination of SI thoughts, development of comprehensive mental wellness plan.   Patient Goals:  "To go home"   Discharge Plan or Barriers: Patient recently admitted. CSW will continue to follow and assess for appropriate referrals and possible discharge planning.   Reason for Continuation of Hospitalization: Depression Medication stabilization Suicidal ideation  Estimated Length of Stay: 3 to 5 days   Attendees: Patient: 11/11/2020   Physician:  11/11/2020   Nursing:  11/11/2020   RN Care Manager: 11/11/2020   Social Worker:  11/11/2020   Recreational Therapist:  11/11/2020   Other:  11/11/2020   Other:  11/11/2020   Other: 11/11/2020     Scribe for Treatment Team: Darleen Crocker, [place] 11/11/2020 10:32 AM

## 2020-11-11 NOTE — Progress Notes (Signed)
10:30 Spiritual Care group delayed / not held due to COVID precautions on unit     Spiritual care group on grief and loss  Group Goal:  Support / Education around grief and loss  Members engage in facilitated group support and psycho-social education.  Group Description:  Following introductions and group rules, group members engaged in facilitated group dialog and support around topic of loss, with particular support around experiences of loss in their lives. Group Identified types of loss (relationships / self / things) and identified patterns, circumstances, and changes that precipitate losses. Reflected on thoughts / feelings around loss, normalized grief responses, and recognized variety in grief experience. 

## 2020-11-12 MED ORDER — PSYLLIUM 95 % PO PACK
1.0000 | PACK | Freq: Two times a day (BID) | ORAL | Status: DC | PRN
  Filled 2020-11-12: qty 1

## 2020-11-12 MED ORDER — TRAZODONE HCL 100 MG PO TABS
100.0000 mg | ORAL_TABLET | Freq: Every day | ORAL | Status: DC
  Administered 2020-11-12: 100 mg via ORAL
  Filled 2020-11-12 (×3): qty 1

## 2020-11-12 MED ORDER — POLYETHYLENE GLYCOL 3350 17 G PO PACK
17.0000 g | PACK | Freq: Two times a day (BID) | ORAL | Status: DC | PRN
  Administered 2020-11-12: 17 g via ORAL
  Filled 2020-11-12: qty 1

## 2020-11-12 NOTE — [facility] Suicide Risk Assessment (Signed)
[facility] INPATIENT:  Family/Significant Other Suicide Prevention Education  Suicide Prevention Education:  Education Completed; Merril Abbe [phone] (Daughter) has been identified by the patient as the family member/significant other with whom the patient will be residing, and identified as the person(s) who will aid the patient in the event of a mental health crisis (suicidal ideations/suicide attempt).  With written consent from the patient, the family member/significant other has been provided the following suicide prevention education, prior to the and/or following the discharge of the patient.  The suicide prevention education provided includes the following:  Suicide risk factors  Suicide prevention and interventions  National Suicide Hotline telephone number  [facility] assessment telephone number  [facility] Emergency Assistance [place] and/or Residential Mobile Crisis Unit telephone number  Request made of family/significant other to:  Remove weapons (e.g., guns, rifles, knives), all items previously/currently identified as safety concern.    Remove drugs/medications (over-the-counter, prescriptions, illicit drugs), all items previously/currently identified as a safety concern.  The family member/significant other verbalizes understanding of the suicide prevention education information provided.  The family member/significant other agrees to remove the items of safety concern listed above.  CSW spoke with Mrs. Karlton Lemon who states that she and her brother have been looking at residential treatment for their mother but states that this is to expensive to do on a self pay basis.  Mrs. Karlton Lemon states that she is interested in [facility] for her mother.  Mrs. Karlton Lemon states that she and her brother are still working on [employer].  Mrs. Karlton Lemon states that her mother has a town home but was living in a home owned by her son.  Mrs. Karlton Lemon states that her  brother has asked his mother to move back to her town home so he can sell it.  Mrs. Karlton Lemon states that her mother will be going to her friends home for 1 week after discharge so someone can watch her and be with her.  Mrs. Karlton Lemon states that she believe PHP will be helpful for her mother.  CSW completed SPE with Mrs. Shelton.   Frutoso Chase Wiletta Bermingham 11/12/2020, 2:16 PM

## 2020-11-12 NOTE — Progress Notes (Signed)
D: Patient presents with pleasant affect. Patient reports feeling better than she has been. Patient denies SI/HI at this time. Patient also denies AH/VH at this time. Patient contracts for safety.  A: Provided positive reinforcement and encouragement.  R: Patient cooperative and receptive to efforts. Patient remains safe on the unit.   11/12/20 2206  Psych Admission Type (Psych Patients Only)  Admission Status Voluntary  Psychosocial Assessment  Patient Complaints None  Eye Contact Fair  Facial Expression Other (Comment) (appropriate)  Affect Appropriate to circumstance  Speech Logical/coherent  Interaction Assertive  Motor Activity Other (Comment) (WDL)  Appearance/Hygiene Unremarkable  Behavior Characteristics Cooperative;Appropriate to situation  Mood Pleasant  Thought Process  Coherency WDL  Content WDL  Delusions None reported or observed  Perception WDL  Hallucination None reported or observed  Judgment WDL  Confusion None  Danger to Self  Current suicidal ideation? Denies  Danger to Others  Danger to Others None reported or observed

## 2020-11-12 NOTE — Progress Notes (Signed)
Recreation Therapy Notes  Animal-Assisted Activity (AAA) Program Checklist/Progress Notes Patient Eligibility Criteria Checklist & Daily Group note for Rec Tx Intervention  Date: 1.11.22 Time: 4 Location: [address]  AAA/T Program Assumption of Risk Form signed by Teacher, music or Parent Legal Guardian YES   Patient is free of allergies or severe asthma YES   Patient reports no fear of animals YES   Patient reports no history of cruelty to animals  YES   Patient understands his/her participation is voluntary YES   Patient washes hands before animal contact  YES   Patient washes hands after animal contact  YES   Behavioral Response: Engaged  Education: Contractor, Appropriate Animal Interaction   Education Outcome: Acknowledges understanding/In group clarification offered/Needs additional education.   Clinical Observations/Feedback: Pt was petting and playing with Bodi in group.  Pt also asked questions about Bodi.    Victorino Sparrow, LRT/CTRS     Victorino Sparrow A 11/12/2020 3:43 PM

## 2020-11-12 NOTE — Progress Notes (Signed)
Patient ID: [name] [name], female   DOB: December 29, 1960, 60 y.o.   MRN: [id number]    [facility] MD Progress Note  11/12/2020 2:19 PM Kinlynn Wiatrowski  MRN:  [id number]  Subjective:  "Im doing alright, just overwhelmed and I'm not speaking to my daughter right now."  Patient seen, chart reviewed and case discussed with treatment team.  Patient states that she was able to sleep better last night after an increased dose of trazodone, she was made aware that we will make this a standing dose for her.  Patient states that the medications seem to be helping her mood although she still is endorsing symptoms of depression including feeling overwhelmed by recent events and having feelings of worthlessness.  Patient is most upset by her children's reaction to her and their lack of trust in her.  Patient denies any adverse effects of medication, none reported or observed.  She is attending groups and making her needs known.  She complained of some constipation today and was agreeable to plan to try MiraLAX and Metamucil.   Principal Problem: Bipolar disorder, unspecified ([place]) Diagnosis: Principal Problem:   Bipolar disorder, unspecified ([place]) Active Problems:   Migraine   PTSD (post-traumatic stress disorder)   Drug overdose, intentional ([place])   Hyperlipidemia   Anxiety disorder, unspecified  Total Time spent with patient: 30 minutes  Past Psychiatric History: Bipolar D/o, Depression.  Third suicide attempts, two inpatient hospitalization in [facility]  [facility]  Past Medical History:  Past Medical History:  Diagnosis Date  . Anxiety   . Arthritis   . Helicobacter pylori (H. pylori) 07/2010  . Migraine     Past Surgical History:  Procedure Laterality Date  . CATARACT EXTRACTION     bilateral  . CERVICAL FUSION    . TUBAL LIGATION    . UPPER GASTROINTESTINAL ENDOSCOPY    . VAGINAL HYSTERECTOMY  2001   USO by history-sono2011 question of both ovaries present   Family History:  Family  History  Adopted: Yes  Problem Relation Age of Onset  . Hypertension Mother   . Mental illness Mother   . Hypertension Brother    Family Psychiatric  History: Mother suffered from unknown mental illness,per her report it appears her mother had addiction problem because she was getting medications from different doctors at the same time. Social History:  Social History   Substance and Sexual Activity  Alcohol Use Yes  . Alcohol/week: 0.0 standard drinks   Comment: Very rare     Social History   Substance and Sexual Activity  Drug Use Yes  . Types: Benzodiazepines    Social History   Socioeconomic History  . Marital status: Divorced    Spouse name: Not on file  . Number of children: 1  . Years of education: 41  . Highest education level: Not on file  Occupational History  . Not on file  Tobacco Use  . Smoking status: Former Smoker    Types: Cigarettes  . Smokeless tobacco: Never Used  Vaping Use  . Vaping Use: Never used  Substance and Sexual Activity  . Alcohol use: Yes    Alcohol/week: 0.0 standard drinks    Comment: Very rare  . Drug use: Yes    Types: Benzodiazepines  . Sexual activity: Yes    Birth control/protection: Post-menopausal, Surgical    Comment: 1st intercourse 60 yo-More than 5 partners  Other Topics Concern  . Not on file  Social History Narrative   Pt went  into the foster system at the age of 80. She as in 4 really bad foster homes. She was raised in [place]. Pt has one brother. Pt graduated HS. Pt lives in [place] and has one son and 2 daughters. Divorced x3. 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    Social Determinants of Health   Financial Resource Strain: Not on file  Food Insecurity: Not on file  Transportation Needs: Not on file  Physical Activity: Not on file  Stress: Not on file  Social Connections: Not on file   Additional Social History:      Sleep: improving   Appetite:  decreased   Current  Medications: Current Facility-Administered Medications  Medication Dose Route Frequency Provider Last Rate Last Admin  . acetaminophen (TYLENOL) tablet 650 mg  650 mg Oral Q6H PRN Prescilla Sours, PA-C   650 mg at 11/11/20 1216  . alum & mag hydroxide-simeth (MAALOX/MYLANTA) 200-200-20 MG/5ML suspension 30 mL  30 mL Oral Q4H PRN Margorie John W, PA-C      . benzocaine (ORAJEL) 10 % mucosal gel   Mouth/Throat TID PRN Delfin Gant, NP   Given at 11/12/20 0748  . DULoxetine (CYMBALTA) DR capsule 60 mg  60 mg Oral Daily Mordecai Maes, NP   60 mg at 11/12/20 0748  . hydrOXYzine (ATARAX/VISTARIL) tablet 25 mg  25 mg Oral TID PRN Prescilla Sours, PA-C   25 mg at 11/11/20 2106  . ibuprofen (ADVIL) tablet 400 mg  400 mg Oral Q8H PRN Charmaine Downs C, NP   400 mg at 11/12/20 0751  . magnesium hydroxide (MILK OF MAGNESIA) suspension 30 mL  30 mL Oral Daily PRN Prescilla Sours, PA-C   30 mL at 11/12/20 0751  . polyethylene glycol (MIRALAX / GLYCOLAX) packet 17 g  17 g Oral BID PRN Basma Buchner A, MD      . psyllium (HYDROCIL/METAMUCIL) 1 packet  1 packet Oral BID PRN Marikay Roads, Dorene Ar, MD      . rosuvastatin (CRESTOR) tablet 5 mg  5 mg Oral Daily Margorie John W, PA-C   5 mg at 11/12/20 0748  . topiramate (TOPAMAX) tablet 200 mg  200 mg Oral BID Prescilla Sours, PA-C   200 mg at 11/12/20 0748  . traZODone (DESYREL) tablet 100 mg  100 mg Oral QHS Rhianon Zabawa, Dorene Ar, MD        Lab Results:  Results for orders placed or performed during the hospital encounter of 11/08/20 (from the past 48 hour(s))  Resp panel by RT-PCR (RSV, Flu A&B, Covid) Nasopharyngeal Swab     Status: None   Collection Time: 11/11/20  9:47 AM   Specimen: Nasopharyngeal Swab; Nasopharyngeal(NP) swabs in vial transport medium  Result Value Ref Range   SARS Coronavirus 2 by RT PCR NEGATIVE NEGATIVE    Comment: (NOTE) SARS-CoV-2 target nucleic acids are NOT DETECTED.  The SARS-CoV-2 RNA is generally detectable in upper  respiratory specimens during the acute phase of infection. The lowest concentration of SARS-CoV-2 viral copies this assay can detect is 138 copies/mL. A negative result does not preclude SARS-Cov-2 infection and should not be used as the sole basis for treatment or other patient management decisions. A negative result may occur with  improper specimen collection/handling, submission of specimen other than nasopharyngeal swab, presence of viral mutation(s) within the areas targeted by this assay, and inadequate number of viral copies(<138 copies/mL). A negative result must be combined with clinical observations, patient  history, and epidemiological information. The expected result is Negative.  Fact Sheet for Patients:  EntrepreneurPulse.com.au  Fact Sheet for Healthcare Providers:  IncredibleEmployment.be  This test is no t yet approved or cleared by the [place] FDA and  has been authorized for detection and/or diagnosis of SARS-CoV-2 by FDA under an Emergency Use Authorization (EUA). This EUA will remain  in effect (meaning this test can be used) for the duration of the COVID-19 declaration under Section 564(b)(1) of the Act, 21 U.S.C.section 360bbb-3(b)(1), unless the authorization is terminated  or revoked sooner.       Influenza A by PCR NEGATIVE NEGATIVE   Influenza B by PCR NEGATIVE NEGATIVE    Comment: (NOTE) The Xpert Xpress SARS-CoV-2/FLU/RSV plus assay is intended as an aid in the diagnosis of influenza from Nasopharyngeal swab specimens and should not be used as a sole basis for treatment. Nasal washings and aspirates are unacceptable for Xpert Xpress SARS-CoV-2/FLU/RSV testing.  Fact Sheet for Patients: EntrepreneurPulse.com.au  Fact Sheet for Healthcare Providers: IncredibleEmployment.be  This test is not yet approved or cleared by the [place] FDA and has been authorized for  detection and/or diagnosis of SARS-CoV-2 by FDA under an Emergency Use Authorization (EUA). This EUA will remain in effect (meaning this test can be used) for the duration of the COVID-19 declaration under Section 564(b)(1) of the Act, 21 U.S.C. section 360bbb-3(b)(1), unless the authorization is terminated or revoked.     Resp Syncytial Virus by PCR NEGATIVE NEGATIVE    Comment: (NOTE) Fact Sheet for Patients: EntrepreneurPulse.com.au  Fact Sheet for Healthcare Providers: IncredibleEmployment.be  This test is not yet approved or cleared by the [place] FDA and has been authorized for detection and/or diagnosis of SARS-CoV-2 by FDA under an Emergency Use Authorization (EUA). This EUA will remain in effect (meaning this test can be used) for the duration of the COVID-19 declaration under Section 564(b)(1) of the Act, 21 U.S.C. section 360bbb-3(b)(1), unless the authorization is terminated or revoked.  Performed at [facility], [place] [address] [place], [place] [address]     Blood Alcohol level:  Lab Results  Component Value Date   ETH <10 11/04/2020   ETH <10 123XX123    Metabolic Disorder Labs: Lab Results  Component Value Date   HGBA1C 4.9 11/09/2020   MPG 93.93 11/09/2020   MPG 105 02/05/2014   No results found for: PROLACTIN Lab Results  Component Value Date   CHOL 209 (H) 11/09/2020   TRIG 152 (H) 11/09/2020   HDL 32 (L) 11/09/2020   CHOLHDL 6.5 11/09/2020   VLDL 30 11/09/2020   LDLCALC 147 (H) 11/09/2020   LDLCALC 116 (H) 06/03/2020    Physical Findings: AIMS: Facial and Oral Movements Muscles of Facial Expression: None, normal Lips and Perioral Area: None, normal Jaw: None, normal Tongue: None, normal,Extremity Movements Upper (arms, wrists, hands, fingers): None, normal Lower (legs, knees, ankles, toes): None, normal, Trunk Movements Neck, shoulders, hips: None, normal, Overall  Severity Severity of abnormal movements (highest score from questions above): None, normal Incapacitation due to abnormal movements: None, normal Patient's awareness of abnormal movements (rate only patient's report): No Awareness, Dental Status Current problems with teeth and/or dentures?: No Does patient usually wear dentures?: No  CIWA:    COWS:     Musculoskeletal: Strength & Muscle Tone: within normal limits Gait & Station: normal Patient leans: N/A  Psychiatric Specialty Exam:     Blood pressure (!) 116/93, pulse 77, temperature 98.1 F (36.7 C),  temperature source Oral, resp. rate 18, height 5\' 1"  (1.549 m), weight 59 kg, last menstrual period 11/02/2000, SpO2 100 %.Body mass index is 24.56 kg/m.  General Appearance: Casual  Eye Contact:  Fair  Speech:  Clear and Coherent and Normal Rate  Volume:  Normal  Mood:  Anxious and Depressed  Affect:  Congruent  Thought Process:  Coherent, Linear and Descriptions of Associations: Intact  Orientation:  Full (Time, Place, and Person)  Thought Content:  Logical  Suicidal Thoughts:  None currently, recent attempt  Homicidal Thoughts:  No  Memory:  Immediate;   Fair Recent;   Fair  Judgement:  Impaired  Insight:  Fair  Psychomotor Activity:  Normal  Concentration:  Concentration: Fair and Attention Span: Fair  Recall:  [employer] of Knowledge:  Fair  Language:  Good  Akathisia:  Negative  Handed:  Right  AIMS (if indicated):     Assets:  Communication Skills Desire for Improvement Resilience Social Support  ADL's:  Intact  Cognition:  WNL  Sleep:  Number of Hours: 6.3     Treatment Plan Summary: Daily contact with patient to assess and evaluate symptoms and progress in treatment   Patient denies active suicidal thoughts with plan or intent but endorses depression with significant feelings of hopelessness, worthlessness, and guilt and anxiety. She denies feelings of mania or hypomania and there were  none observed  during this evaluation. She is complaint with medications and declined any medication additions at this time.   Medications: To reduce current symptoms to base line and improve the patient's overall level of functioning resumed the following;   -Continue  Cymbalta 60 mg po daily for depression -Continue Topamax 200 mg po bid for Migraine headache/Mood stabilization -Crestor 5 mg po daily for Hyperlipidemia -Trazodone increased to 100mg  standing qhs - Hydroxyzine 25 mg po 3 times a day as needed for anxiety  Patient will continue to participate in unit milieu including  therapy group sessions to further improve mental health.    Will continue to  monitor mood and behavior and adjust plan of care as appropriate.  Dixie Dials, MD 11/12/2020, 2:19 PM

## 2020-11-12 NOTE — Progress Notes (Signed)
D:  Patient's self inventory sheet, patient sleeps good, sleep medication helpful.  Fair appetite, normal energy level, good concentration.  Denied depression, hopeless and anxiety.  Denied withdrawals.  Denied SI.  Physical problems, headaches, toothache, worst pain #8 in past 24 hours.  Pain medicine helpful.  Goal is discharge.  Plans to attend all groups.  Does have discharge plans. A:  Medications administered per MD orders.  Emotional support and encouragement given patient. R:  Denied SI and HI, contracts for safety.  Denied A/V hallucinations.  Safety maintained with 15 minute checks.

## 2020-11-12 NOTE — Plan of Care (Signed)
Nurse discussed anxiety, depression and coping skills with patient.  

## 2020-11-13 LAB — RESP PANEL BY RT-PCR (RSV, FLU A&B, COVID)  RVPGX2
Influenza A by PCR: NEGATIVE
Influenza B by PCR: NEGATIVE
Resp Syncytial Virus by PCR: NEGATIVE
SARS Coronavirus 2 by RT PCR: POSITIVE — AB

## 2020-11-13 MED ORDER — POLYETHYLENE GLYCOL 3350 17 G PO PACK: 17.0000 g | PACK | Freq: Two times a day (BID) | ORAL | 0 refills | Status: DC | PRN

## 2020-11-13 MED ORDER — PSYLLIUM 95 % PO PACK: 1.0000 | PACK | Freq: Two times a day (BID) | ORAL | Status: DC | PRN

## 2020-11-13 MED ORDER — DULOXETINE HCL 60 MG PO CPEP: 60.0000 mg | ORAL_CAPSULE | Freq: Every day | ORAL | 0 refills | Status: DC

## 2020-11-13 MED ORDER — BENZOCAINE 10 % MT GEL: Freq: Three times a day (TID) | OROMUCOSAL | 0 refills | Status: DC | PRN

## 2020-11-13 MED ORDER — TRAZODONE HCL 100 MG PO TABS: 100.0000 mg | ORAL_TABLET | Freq: Every day | ORAL | 0 refills | Status: DC

## 2020-11-13 MED ORDER — HYDROXYZINE HCL 25 MG PO TABS: 25.0000 mg | ORAL_TABLET | Freq: Three times a day (TID) | ORAL | 0 refills | Status: DC | PRN

## 2020-11-13 MED ORDER — TOPIRAMATE 200 MG PO TABS: 200.0000 mg | ORAL_TABLET | Freq: Two times a day (BID) | ORAL | 0 refills | Status: DC

## 2020-11-13 MED ORDER — CLONAZEPAM 0.5 MG PO TABS: 0.5000 mg | ORAL_TABLET | ORAL | Status: DC

## 2020-11-13 NOTE — Progress Notes (Signed)
Recreation Therapy Notes  Date:  1.12.22 Time: 0930 Location: [address]  Group Topic: Stress Management  Goal Area(s) Addresses:  Patient will identify positive stress management techniques. Patient will identify benefits of using stress management post d/c.  Intervention: Stress Management  Activity: Guided Imagery.  LRT was to read a scrip that lead patients on a journey through the forest to enjoy the sights, sounds and smells it has to offer.  Patients were to listen and follow along as script was read to engage in the activity.  Education:  Stress Management, Discharge Planning.   Education Outcome: Acknowledges Education  Clinical Observations/Feedback: Due to COVID precautions on unit, group was not held.  Instead patients were given a packet that focused on Unhealthy vs. Healthy, breaking down goals, self care and word searchs/puzzles.     Victorino Sparrow, LRT/CTRS        Victorino Sparrow A 11/13/2020 11:23 AM

## 2020-11-13 NOTE — Discharge Summary (Signed)
Physician Discharge Summary Note  Patient:  [name] [name] is an 60 y.o., female MRN:  [id number] DOB:  Jul 29, 1961 Patient phone:  [phone] (home)  Patient address:   [place] [id number],   Total Time spent with patient: Greater than 30 minutes  Date of Admission:  11/08/2020  Date of Discharge: 11-13-20  Reason for Admission: Suicide attempt by overdose on Xanax, phenergan & other medications in a suicide attempt.  Principal Problem: Severe episode of recurrent major depressive disorder, without psychotic features [facility])  Discharge Diagnoses: Principal Problem:   Severe episode of recurrent major depressive disorder, without psychotic features ([place]) Active Problems:   Migraine   PTSD (post-traumatic stress disorder)   Drug overdose, intentional ([place])   Hyperlipidemia   Bipolar disorder, unspecified ([place])   Anxiety disorder, unspecified  Past Psychiatric History: Major depressive disorder, GAD.  Past Medical History:  Past Medical History:  Diagnosis Date  . Anxiety   . Arthritis   . Helicobacter pylori (H. pylori) 07/2010  . Migraine     Past Surgical History:  Procedure Laterality Date  . CATARACT EXTRACTION     bilateral  . CERVICAL FUSION    . TUBAL LIGATION    . UPPER GASTROINTESTINAL ENDOSCOPY    . VAGINAL HYSTERECTOMY  2001   USO by history-sono2011 question of both ovaries present   Family History:  Family History  Adopted: Yes  Problem Relation Age of Onset  . Hypertension Mother   . Mental illness Mother   . Hypertension Brother    Family Psychiatric  History: See H&P  Social History:  Social History   Substance and Sexual Activity  Alcohol Use Yes  . Alcohol/week: 0.0 standard drinks   Comment: Very rare     Social History   Substance and Sexual Activity  Drug Use Yes  . Types: Benzodiazepines    Social History   Socioeconomic History  . Marital status: Divorced    Spouse name: Not on file  .  Number of children: 1  . Years of education: 107  . Highest education level: Not on file  Occupational History  . Not on file  Tobacco Use  . Smoking status: Former Smoker    Types: Cigarettes  . Smokeless tobacco: Never Used  Vaping Use  . Vaping Use: Never used  Substance and Sexual Activity  . Alcohol use: Yes    Alcohol/week: 0.0 standard drinks    Comment: Very rare  . Drug use: Yes    Types: Benzodiazepines  . Sexual activity: Yes    Birth control/protection: Post-menopausal, Surgical    Comment: 1st intercourse 60 yo-More than 5 partners  Other Topics Concern  . Not on file  Social History Narrative   Pt went into the foster system at the age of 23. She as in 4 really bad foster homes. She was raised in [place]. Pt has one brother. Pt graduated HS. Pt lives in [place] and has one son and 2 daughters. Divorced x3. 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    Social Determinants of Health   Financial Resource Strain: Not on file  Food Insecurity: Not on file  Transportation Needs: Not on file  Physical Activity: Not on file  Stress: Not on file  Social Connections: Not on file   Hospital Course: (Per Md's admission evaluation notes): Female  60 years old admitted to the unit last evening from [facility].  Patient was admitted to the  inpatient unit at [facility] after she was found unresponsive by her children.  Patient had OD on Xanax.  Today patient participated in this interview and reports that she OD on Xanax and Phenergan and two other medications of which she could not remember the names after her children became angry at her for not managing her money well.  She admits that this OD is her third attempt with medication as a suicide attempt.  She as hospitalized in the unit  From august 2nd 2021- August 5th 2021 for treatment of Depression and OD.  She could not explain further what she OD on at the time . Patient reports that she lost $8, 000:00   she gave to a random man she met in the Internet. Patient admits to previous diagnosis of Bipolar disorder and states she has been tried on multiple  Medications in the past..  She only could name Cymbalta but later informed DR Nelda Marseille that she has tried Depakote, Lamictal in the past.  She reports that this is her second inpatient Psychiatric unit.  Patient rates her depression the past two weeks 10/10- with 10 being worst depression.   She reports that she realized she was talking to a fake person and already sent out money got her very depressed.  She reports no issues with sleep but poor appetite.  Patient is self employed and has her own cleaning company.  She states she has been been taking medications for a while but was engaged in Therapy.  As self employed she did not know how and when her insurance stopped and she could not continue with therapy.  She lost her long time therapist she bonded with due to retirement.  Patient has insurance now and will be looking forward to a new therapist.  She denies using illicit drugs or alcohol but she states her children are trying to send her to a treatment facility.  Patient could not explain what type of facility her children are arranging for her to go to.  Patient denies feeling suicidal today and no thoughts of wanting to hurt herself.  She is currently on Cymbalta and Topamax which she claims is for Migraine headache.  Patient has been encouraged to participate on various group activities offered in the unit.  We will continue to monitor her mental status and round on her daily.   This istheseconddischarge summaries for this3year old female from this [place]. She was a patient in this hospital from August 2nd thru August 5th, 2021 with similar presentation.Leniyah hasa hx of mental illness, impulsive behaviors  &suicide attempt by overdose on medications.She reported onthis presentadmission that she was interacting with a fake person she met  online & had sent to this person an $8,000.00 cash of which when her children found out were not very pleased with her behaviors. She attempted to overdose on the above mentioned medications because her children were angry at her. She was recommended for mood stabilization treatments.  After evaluation of herpresenting symptoms, it was jointly agreed by the treatment team that Pearlie will need mood stabilization treatments. The medication regimentargeting her presenting symptomswere discussed &initiatedwith herconsent. She received &was stabilized on themedicationsas listed below on her discharge medication lists. She was also enrolled & participated in the group counseling sessions being offered & held on this unit.She learned coping skills.She presented other significant medical issues that required treatment  & or monitoring.She was resumed & discharged on all herpertinent home medications for those health issues. She tolerated  hertreatment regimen without any adverse effects or reactions reported.   Charlestine's symptoms responded well to hertreatment regimen. This is evidenced by her dailyreports of improved mood, resolution of symptoms &presentation of good affect/eye contact.Sheiscurrently mentally & medically stable for dischargeto continue mental health careas recommended below on an outpatient basis.   Today upon discharge evaluationwith the attending psychiatrist today,Rachelshares, "I'm doing good. I feel much better".She denies any specific concerns.She is sleeping well. Herappetite is good. She denies other physical complaints.She denies SI/HI/AH/VH.She is tolerating hermedications well &in agreement to continue hercurrent regimen as noted on the discharge medication lists below.She will follow up for routine psychiatric care, counseling services&medication management as noted below. She is provided with all the necessary information needed to make these  appointments without problems. Magaby was able to engage in safety planning including plan to return to [facility] or contact emergency services if he feels unable to maintain herown safety or the safety of others. Pt had no further questions, comments or concerns. She left [facility] with all personal belongings in no apparent distress. Transportation per the [employer].  Physical Findings: AIMS: Facial and Oral Movements Muscles of Facial Expression: None, normal Lips and Perioral Area: None, normal Jaw: None, normal Tongue: None, normal,Extremity Movements Upper (arms, wrists, hands, fingers): None, normal Lower (legs, knees, ankles, toes): None, normal, Trunk Movements Neck, shoulders, hips: None, normal, Overall Severity Severity of abnormal movements (highest score from questions above): None, normal Incapacitation due to abnormal movements: None, normal Patient's awareness of abnormal movements (rate only patient's report): No Awareness, Dental Status Current problems with teeth and/or dentures?: No Does patient usually wear dentures?: No  CIWA:    COWS:     Musculoskeletal: Strength & Muscle Tone: within normal limits Gait & Station: normal Patient leans: N/A  Psychiatric Specialty Exam: Physical Exam Vitals and nursing note reviewed.  HENT:     Head: Normocephalic.     Nose: Nose normal.     Mouth/Throat:     Pharynx: Oropharynx is clear.  Eyes:     Pupils: Pupils are equal, round, and reactive to light.  Cardiovascular:     Rate and Rhythm: Normal rate.     Pulses: Normal pulses.  Pulmonary:     Effort: Pulmonary effort is normal.  Genitourinary:    Comments: Deferred Musculoskeletal:        General: Normal range of motion.     Cervical back: Normal range of motion.  Skin:    General: Skin is warm and dry.  Neurological:     General: No focal deficit present.     Mental Status: She is alert and oriented to person, place, and time.     Review of Systems   Constitutional: Negative for chills, diaphoresis and fever.  HENT: Negative for congestion, rhinorrhea, sneezing and sore throat.   Eyes: Negative for discharge.  Respiratory: Negative for cough, chest tightness, shortness of breath and wheezing.   Cardiovascular: Negative for chest pain and palpitations.  Gastrointestinal: Negative for diarrhea, nausea and vomiting.  Endocrine: Negative for cold intolerance.  Genitourinary: Negative for difficulty urinating.  Musculoskeletal: Negative for arthralgias and myalgias.  Allergic/Immunologic: Negative for environmental allergies and food allergies.       Allergies: NKDA  Neurological: Negative for dizziness, tremors, seizures, syncope, numbness and headaches.  Psychiatric/Behavioral: Positive for dysphoric mood (Stabilized with medication prior to discharge) and sleep disturbance (Stabilized with medication prior to discharge). Negative for agitation, behavioral problems, confusion, decreased concentration, hallucinations, self-injury and suicidal ideas.  The patient is not nervous/anxious (Stable upon discharge) and is not hyperactive.     Blood pressure 103/83, pulse 71, temperature 97.9 F (36.6 C), resp. rate 18, height '5\' 1"'  (1.549 m), weight 59 kg, last menstrual period 11/02/2000, SpO2 100 %.Body mass index is 24.56 kg/m.  See Md's discharge SRA  Sleep:  Number of Hours: 6.75   Has this patient used any form of tobacco in the last 30 days? (Cigarettes, Smokeless Tobacco, Cigars, and/or Pipes): N/A  Blood Alcohol level:  Lab Results  Component Value Date   ETH <10 11/04/2020   ETH <10 16/08/9603    Metabolic Disorder Labs:  Lab Results  Component Value Date   HGBA1C 4.9 11/09/2020   MPG 93.93 11/09/2020   MPG 105 02/05/2014   No results found for: PROLACTIN Lab Results  Component Value Date   CHOL 209 (H) 11/09/2020   TRIG 152 (H) 11/09/2020   HDL 32 (L) 11/09/2020   CHOLHDL 6.5 11/09/2020   VLDL 30 11/09/2020   LDLCALC  147 (H) 11/09/2020   LDLCALC 116 (H) 06/03/2020   See Psychiatric Specialty Exam and Suicide Risk Assessment completed by Attending Physician prior to discharge.  Discharge destination:  Home  Is patient on multiple antipsychotic therapies at discharge:  No   Has Patient had three or more failed trials of antipsychotic monotherapy by history:  No  Recommended Plan for Multiple Antipsychotic Therapies: NA  Allergies as of 11/13/2020   No Known Allergies     Medication List    STOP taking these medications   bisacodyl 5 MG EC tablet Commonly known as: DULCOLAX   meloxicam 15 MG tablet Commonly known as: MOBIC     TAKE these medications     Indication  acetaminophen 500 MG tablet Commonly known as: TYLENOL Take 500 mg by mouth every 6 (six) hours as needed for headache.  Indication: Fever, Pain   benzocaine 10 % mucosal gel Commonly known as: ORAJEL Use as directed in the mouth or throat 3 (three) times daily as needed for mouth pain (apply to tooth area).  Indication: Mouth pain   DULoxetine 60 MG capsule Commonly known as: CYMBALTA Take 1 capsule (60 mg total) by mouth daily. For depression Start taking on: November 14, 2020 What changed:   medication strength  how much to take  Indication: Major Depressive Disorder   hydrOXYzine 25 MG tablet Commonly known as: ATARAX/VISTARIL Take 1 tablet (25 mg total) by mouth 3 (three) times daily as needed for anxiety.  Indication: Feeling Anxious   polyethylene glycol 17 g packet Commonly known as: MIRALAX / GLYCOLAX Take 17 g by mouth daily as needed. (May buy from over the counter): For constipation What changed: Another medication with the same name was added. Make sure you understand how and when to take each.  Indication: Constipation   polyethylene glycol 17 g packet Commonly known as: MIRALAX / GLYCOLAX Take 17 g by mouth 2 (two) times daily as needed. (May buy from over the counter): For constipation What  changed: You were already taking a medication with the same name, and this prescription was added. Make sure you understand how and when to take each.  Indication: Constipation   psyllium 95 % Pack Commonly known as: HYDROCIL/METAMUCIL Take 1 packet by mouth 2 (two) times daily as needed. (May buy from over the counter): For constipation  Indication: Constipation   rosuvastatin 5 MG tablet Commonly known as: Crestor Take 1 tablet (5 mg total) by mouth  daily.  Indication: High Amount of Fats in the Blood, High Amount of Triglycerides in the Blood   topiramate 200 MG tablet Commonly known as: TOPAMAX Take 1 tablet (200 mg total) by mouth 2 (two) times daily. For mood stabilization  Indication: Mood stabilization   traZODone 100 MG tablet Commonly known as: DESYREL Take 1 tablet (100 mg total) by mouth at bedtime.  Indication: Trouble Sleeping      Follow-up recommendations: Activity:  As tolerated Diet: As recommended by your primary care doctor. Keep all scheduled follow-up appointments as recommended.  Comments: Prescriptions given at discharge.  Patient agreeable to plan.  Given opportunity to ask questions.  Appears to feel comfortable with discharge denies any current suicidal or homicidal thought. Patient is also instructed prior to discharge to: Take all medications as prescribed by his/her mental healthcare provider. Report any adverse effects and or reactions from the medicines to his/her outpatient provider promptly. Patient has been instructed & cautioned: To not engage in alcohol and or illegal drug use while on prescription medicines. In the event of worsening symptoms, patient is instructed to call the crisis hotline, 911 and or go to the nearest ED for appropriate evaluation and treatment of symptoms. To follow-up with his/her primary care provider for your other medical issues, concerns and or health care needs.  Signed: Lindell Spar, NP, PMHNP, FNP-BC 11/13/2020,  1:29 PM

## 2020-11-13 NOTE — Progress Notes (Signed)
The patient rated her day as a 6 out of 10 since she felt that she did not have a very good day. Her goal for tomorrow is to take a shower and to focus on getting discharged.

## 2020-11-13 NOTE — Progress Notes (Signed)
  [facility] Adult Case Management Discharge Plan :  Will you be returning to the same living situation after discharge:  Yes,  personal home/ with friend At discharge, do you have transportation home?: Yes,  via daughter Do you have the ability to pay for your medications: Yes,  has employment and private insurance  Release of information consent forms completed and in the chart;  Patient's signature needed at discharge.  Patient to Follow up at:  Follow-up Information    BEHAVIORAL HEALTH PARTIAL HOSPITALIZATION PROGRAM Follow up on 11/19/2020.   Specialty: Behavioral Health Why: You have an intake appointment on 11/19/20 at 2:00 pm for the Partial Hospitalization program for therapy and medication management services.  This will be a VIRTUAL appointment. [employer]: ID # [id number], Group # Nelly Rout, # [phone] Contact information: [place] [id number] [place] [phone]              Next level of care provider has access to [place] and Suicide Prevention discussed: Yes,  w/ daughter     Has patient been referred to the Quitline?: Patient refused referral  Patient has been referred for addiction treatment: N/A  Mliss Fritz, Latanya Presser 11/13/2020, 9:50 AM

## 2020-11-13 NOTE — BHH Group Notes (Signed)
[place] LCSW Group Therapy  11/13/2020 1:52 PM  Type of Therapy:  CBT Packet   Participation Level:  Active  Participation Quality:  Appropriate  Affect:  Appropriate  Cognitive:  Appropriate  Insight:  Developing/Improving  Engagement in Therapy:  Developing/Improving  Modes of Intervention:  Activity  Summary of Progress/Problems: Due to Covid 19 CSW provided patient with activity packet.   [name] [name] 11/13/2020, 1:52 PM

## 2020-11-13 NOTE — Progress Notes (Signed)
Patient denies SI/HI. Patient received both written and verbal discharge instructions. Patient verbalizes understanding of discharge instructions. Patient agreed to follow up appt and med regimen. Patient received an AVS, SRA, transitional record and prescriptions. Patient received belongings from room and locker. Patient safely discharged to the lobby and transported home by safe transport.

## 2020-11-13 NOTE — BHH Suicide Risk Assessment (Signed)
[facility] Discharge Suicide Risk Assessment   Principal Problem: Bipolar disorder, unspecified ([place]) Discharge Diagnoses: Principal Problem:   Bipolar disorder, unspecified ([place]) Active Problems:   Migraine   PTSD (post-traumatic stress disorder)   Drug overdose, intentional ([place])   Hyperlipidemia   Anxiety disorder, unspecified   Total Time spent with patient: 20 minutes  Musculoskeletal: Strength & Muscle Tone: within normal limits Gait & Station: normal Patient leans: N/A  Psychiatric Specialty Exam:   Blood pressure 103/83, pulse 71, temperature 98.2 F (36.8 C), temperature source Oral, resp. rate 18, height 5\' 1"  (1.549 m), weight 59 kg, last menstrual period 11/02/2000, SpO2 100 %.Body mass index is 24.56 kg/m.  General Appearance: Casual  Eye Contact::  Fair  Speech:  Clear and [id number]  Volume:  Normal  Mood:  Euthymic  Affect:  Appropriate  Thought Process:  Coherent  Orientation:  Full (Time, Place, and Person)  Thought Content:  Logical  Suicidal Thoughts:  No  Homicidal Thoughts:  No  Memory:  Recent;   Fair  Judgement:  Intact  Insight:  Fair  Psychomotor Activity:  Normal  Concentration:  Fair  Recall:  [employer] of Knowledge:Fair  Language: Fair  Akathisia:  No  Handed:  Right  AIMS (if indicated):     Assets:  Desire for Improvement Housing Resilience  Sleep:  Number of Hours: 6.75  Cognition: WNL  ADL's:  Intact   Mental Status Per Nursing Assessment::   On Admission:  Suicidal ideation indicated by patient  Demographic Factors:  Divorced or widowed and Living alone  Loss Factors: Loss of significant relationship  Historical Factors: Prior suicide attempts and Impulsivity  Risk Reduction Factors:   Sense of responsibility to family, Religious beliefs about death, Employed, Positive social support and Positive coping skills or problem solving skills  Continued Clinical Symptoms:  Previous Psychiatric Diagnoses and  Treatments  Cognitive Features That Contribute To Risk:  None    Suicide Risk:  Mild:  Suicidal ideation of limited frequency, intensity, duration, and specificity.  There are no identifiable plans, no associated intent, mild dysphoria and related symptoms, good self-control (both objective and subjective assessment), few other risk factors, and identifiable protective factors, including available and accessible social support.   Follow-up Information    BEHAVIORAL HEALTH PARTIAL HOSPITALIZATION PROGRAM Follow up on 11/19/2020.   Specialty: Behavioral Health Why: You have an intake appointment on 11/19/20 at 2:00 pm for the Partial Hospitalization program for therapy and medication management services.  This will be a VIRTUAL appointment. [employer]: ID # [id number], Group # Nelly Rout, # [phone] Contact information: [place] [id number] [place] [phone]              Plan Of Care/Follow-up recommendations:  Other:  Continue with medication treatment, Follow-up with outpatient care, Stay sober  Dixie Dials, MD 11/13/2020, 9:51 AM

## 2020-11-13 NOTE — Progress Notes (Signed)
  [facility] Adult Case Management Discharge Plan :  Will you be returning to the same living situation after discharge:  Yes,  Home At discharge, do you have transportation home?: Yes,  Safe Transport Do you have the ability to pay for your medications: Yes,  insurance and family  Release of information consent forms completed and in the chart;  Patient's signature needed at discharge.  Patient to Follow up at:  Follow-up Information    BEHAVIORAL HEALTH PARTIAL HOSPITALIZATION PROGRAM Follow up on 11/19/2020.   Specialty: Behavioral Health Why: You have an intake appointment on 11/19/20 at 2:00 pm for the Partial Hospitalization program for therapy and medication management services.  This will be a VIRTUAL appointment. [employer]: ID # [id number], Group # Nelly Rout, # [phone] Contact information: [place] [id number] [place] [phone]              Next level of care provider has access to [place] and Suicide Prevention discussed: Yes,  with daughter and patient     Has patient been referred to the Quitline?: Patient refused referral  Patient has been referred for addiction treatment: N/A  Darleen Crocker, [place] 11/13/2020, 12:54 PM

## 2020-11-19 ENCOUNTER — Telehealth (HOSPITAL_COMMUNITY): Payer: Self-pay | Admitting: Licensed Clinical Social Worker

## 2020-11-19 ENCOUNTER — Other Ambulatory Visit: Payer: Self-pay

## 2020-11-19 ENCOUNTER — Other Ambulatory Visit (HOSPITAL_COMMUNITY): Payer: No Payment, Other

## 2020-11-19 NOTE — Telephone Encounter (Signed)
Cln called pt to discuss pt's scheduled virtual appointment for PHP intake today, 1/18 at 2pm. Pt states she is aware of and prepared to keep appointment. Cln discussed with pt how the virtual appointment would proceed and confirmed email address on file. Pt confirms [email or web address] is accurate. Cln asked pt if she had been educated on the purpose of the appointment today and pt reports she had not. Cln oriented pt to PHP. Pt states she is unable to adhere to the PHP schedule due to her work schedule and inability to take time off. Cln provided information on IOP and pt states the time commitment would still be a barrier. Cln encouraged individual outpatient therapy and medication management and pt is open. Pt states she does not currently have providers. Cln provided information for [facility] and psychology today providers in [place] who accept her insurance, via email per pt's preference. Pt denies SI/HI and reports understanding crisis support options. Cln informed pt that pt's 2pm appointment will be canceled and pt is in agreement.

## 2020-11-26 ENCOUNTER — Telehealth: Payer: Self-pay | Admitting: Internal Medicine

## 2020-11-26 MED ORDER — ROSUVASTATIN CALCIUM 5 MG PO TABS: 5.0000 mg | ORAL_TABLET | Freq: Every day | ORAL | 0 refills | Status: DC

## 2020-11-26 NOTE — Telephone Encounter (Signed)
[name] [name] [phone]  [name] called to see if she could get refill on below medication, she stated she had quit taking it because she had no insurance. She now has insurance but has to get it filled at new pharmacy. She also stated her cholesterol was high when she was in hospital.  rosuvastatin (CRESTOR) 5 MG tablet   Field seismologist and [place]

## 2020-11-26 NOTE — Telephone Encounter (Signed)
Due for CPE in April. Send in Crestor 5 mg daily #30 with 2 refills and make CPE appt

## 2020-11-29 ENCOUNTER — Telehealth: Payer: Self-pay | Admitting: Gastroenterology

## 2020-11-29 ENCOUNTER — Telehealth: Payer: Self-pay | Admitting: Internal Medicine

## 2020-11-29 NOTE — Telephone Encounter (Signed)
I just looked in chart and that referral was put in when she was in the hospital and it was done wrong so I fixed it.

## 2020-11-29 NOTE — Telephone Encounter (Signed)
Hi Dr. Ardis Hughs, this is a pt of yours. We have received a referral from [facility] for duodenal mass. Please advise on scheduling. Thank you.

## 2020-11-29 NOTE — Telephone Encounter (Signed)
Called and spoke with someone at gastrology and they are going to take care of referral and get patient scheduled.

## 2020-11-29 NOTE — Telephone Encounter (Signed)
[name] [name] [phone]  [name] called to say when she was in the hospital she was diagnosis with a mass in her stomach and she was to contact PCP to get a referral to gastrology.

## 2020-12-01 NOTE — Telephone Encounter (Signed)
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  Please offer her my first available OV to discuss further.  Thanks

## 2020-12-02 ENCOUNTER — Other Ambulatory Visit: Payer: Self-pay

## 2020-12-02 ENCOUNTER — Ambulatory Visit (INDEPENDENT_AMBULATORY_CARE_PROVIDER_SITE_OTHER): Payer: 59 | Admitting: Clinical

## 2020-12-02 DIAGNOSIS — F39 Unspecified mood [affective] disorder: Secondary | ICD-10-CM | POA: Diagnosis not present

## 2020-12-02 DIAGNOSIS — F419 Anxiety disorder, unspecified: Secondary | ICD-10-CM
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
## 2020-12-02 NOTE — Telephone Encounter (Signed)
OV scheduled on 2/1 at 10:30am.

## 2020-12-03 ENCOUNTER — Ambulatory Visit: Payer: Self-pay | Admitting: Gastroenterology

## 2020-12-10 ENCOUNTER — Telehealth: Payer: Self-pay

## 2020-12-10 ENCOUNTER — Encounter: Payer: Self-pay | Admitting: Internal Medicine

## 2020-12-10 ENCOUNTER — Other Ambulatory Visit: Payer: Self-pay

## 2020-12-10 ENCOUNTER — Ambulatory Visit (INDEPENDENT_AMBULATORY_CARE_PROVIDER_SITE_OTHER): Payer: 59 | Admitting: Internal Medicine

## 2020-12-10 VITALS — BP 100/80 | HR 68 | Temp 97.8°F | Ht 61.0 in | Wt 132.0 lb

## 2020-12-10 DIAGNOSIS — J029 Acute pharyngitis, unspecified: Secondary | ICD-10-CM

## 2020-12-10 DIAGNOSIS — Z7189 Other specified counseling: Secondary | ICD-10-CM

## 2020-12-10 DIAGNOSIS — I728 Aneurysm of other specified arteries: Secondary | ICD-10-CM | POA: Diagnosis not present

## 2020-12-10 DIAGNOSIS — K3189 Other diseases of stomach and duodenum: Secondary | ICD-10-CM

## 2020-12-10 DIAGNOSIS — Z8659 Personal history of other mental and behavioral disorders: Secondary | ICD-10-CM

## 2020-12-10 DIAGNOSIS — Z8616 Personal history of COVID-19: Secondary | ICD-10-CM

## 2020-12-10 DIAGNOSIS — B349 Viral infection, unspecified: Secondary | ICD-10-CM

## 2020-12-10 MED ORDER — AZITHROMYCIN 250 MG PO TABS: ORAL_TABLET | ORAL | 0 refills | Status: DC

## 2020-12-10 MED ORDER — ESTRADIOL 0.1 MG/24HR TD PTTW: 1.0000 | MEDICATED_PATCH | TRANSDERMAL | 12 refills | Status: DC

## 2020-12-10 NOTE — Telephone Encounter (Signed)
OK but she has to wear a mask in

## 2020-12-10 NOTE — Patient Instructions (Addendum)
Patient is to take Zithromax Z-PAK 2 tabs day 1 followed by 1 tab days 2 through 5.  Rest and drink fluids.  Call if not better in 10 days or sooner if worse.  Keep appointment with Dr. Edison Nasuti in March regarding duodenal mass.

## 2020-12-10 NOTE — Progress Notes (Signed)
   Subjective:    Patient ID: [name] [name], female    DOB: [birth year], 60 y.o.   MRN: [id number]  HPI 60 year old Female recently hospitalized January 7th-January 12 at [facility] recently seen by Janece Canterbury with visit January 31st.  Patient had OD with Xanax, Phenergan, and possibly other meds on January 3rd. Was hospitalized January 3rd and urine drug screen positive for benzodiazepines. Records reviewed. 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 Has appt in March to see Dr. Ardis Hughs, Gastroenterologist. Is now on Topamax for mood stabilization.Patient reports testing positive for Covid-19 in January. Records show she had a Respiratory virus panel on January 12 positive for Covid-19.  Patient called today saying that she had a sore throat, difficulty swallowing and coughing up blood since being diagnosed with COVID-19.  She says she went to urgent care on January 31 and was advised to gargle with salt water.  Says this has not helped.  Says main complaint is sore throat.  Denies currently fever or shaking chills.  Says she has some cough with discolored sputum.  She had chest x-ray at the time she presented to the emergency department January 4.  There was widened mediastinum and a CT angio was performed showing a fusiform 2 cm aneurysm of the proximal right subclavian artery.  They recommended annual imaging by CTA or MRA.  Patient denies headache or dysgeusia.    Review of Systems see above     Objective:   Physical Exam Temperature 97.8 degrees blood pressure 100/80 pulse 68 pulse oximetry 97% weight 132 pounds BMI 24.94.  Pleasant female seen in no acute distress.  Her pharynx is injected but no exudate is present.  TMs are clear bilaterally.  Neck is supple without adenopathy.  Her chest is clear to auscultation without rales or wheezing.       Assessment & Plan:  History of COVID-19 virus  infection early January 2022  Persistent pharyngitis and cough status post COVID-19 virus infection  Plan: Patient was given Zithromax Z-PAK 2 tabs day 1 followed by 1 tab days 2 through 5.  Rest and drink fluids.  Also, she has a fusiform 2 cm aneurysm of the proximal right subclavian artery and will need annual imaging by CTA.  Also, concern for stromal tumor at the ligament of Treitz and has an appointment with Dr. Ardis Hughs, Gastroenterologist in March  History of suicidal gesture and history of depression.  Currently on Topamax for mood stabilizer  History of estrogen replacement.  She requests that I refill her estrogen patch (Minivelle (0.1 mg / 24-hour) apply to skin twice weekly.

## 2020-12-10 NOTE — Telephone Encounter (Signed)
Had [name] [name] on 11/08/20 since then she has a sore throat it is very hard to swallow, coughing up blood. Temperature is 97.7. She said she went to urgent care on 1/31 and they didn't give her anything for it they told her to gargle with salt water and it has not helped. She would like an appointment.

## 2020-12-17 ENCOUNTER — Ambulatory Visit (INDEPENDENT_AMBULATORY_CARE_PROVIDER_SITE_OTHER): Payer: 59 | Admitting: Clinical

## 2020-12-17 ENCOUNTER — Other Ambulatory Visit: Payer: Self-pay

## 2020-12-17 DIAGNOSIS — F419 Anxiety disorder, unspecified: Secondary | ICD-10-CM | POA: Diagnosis not present

## 2020-12-17 DIAGNOSIS — F39 Unspecified mood [affective] disorder: Secondary | ICD-10-CM
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
## 2020-12-31 ENCOUNTER — Ambulatory Visit (INDEPENDENT_AMBULATORY_CARE_PROVIDER_SITE_OTHER): Payer: 59 | Admitting: Clinical

## 2020-12-31 ENCOUNTER — Other Ambulatory Visit: Payer: Self-pay

## 2020-12-31 DIAGNOSIS — F419 Anxiety disorder, unspecified: Secondary | ICD-10-CM | POA: Diagnosis not present

## 2020-12-31 DIAGNOSIS — F39 Unspecified mood [affective] disorder: Secondary | ICD-10-CM

## 2020-12-31 NOTE — Progress Notes (Signed)
   THERAPIST PROGRESS NOTE  Session Time: 8am  Participation Level: Active  Behavioral Response: Casual and NeatAlertAnxious  Type of Therapy: Individual Therapy  Treatment Goals addressed: Anxiety  Interventions: Supportive  [place] [facility]  Summary: [name] [name] is a 60 y.o. female who presents in an anxious manner. The pt reports her children have little or nothing to do with her and are not seen as a source of support for her. 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 Pt discussed dynamic of relationship with each of her children, stating that they have been spoiled most of their lives. Pt reports her children have requested she participate in a competency hearing on 3/17 to assess whether or not she is fit to care for herself. Pt expressed not having any deficits with self care or activities of daily living. Pt identifies the following as what makes her happy: her cat, grandchildren, helping others, spending time with best friend and cleaning.   Suicidal/Homicidal: Pt denies any SI/HI no plan or intent to harm self or others reported. Pt states having list of mental health resources provided to her in previous session and agreeable to utilize supports in the event of an emergency. Additionally, pt identifies her best friend as a positive support and protective factor.  Therapist Response: CSW explored family conflict from the pts perspective. csw discussed with pt importance of boundaries in a relationship and modeled how to establish personal boundaries. Modeling utilized to teach pt ways to resolve conflict in a healthy manner with her children. CSW actively listened as pt verbalized feeling unattractive and unimportant. CSW assisted pt in identifying things she does well and reinforced strengths pt may overlook.   Plan: Return again in 1 weeks.  Diagnosis: Axis I: Mood disorder                                      Anxiety   Axis II: No diagnosis    Yvette Rack, LCSW 12/31/2020

## 2021-01-14 ENCOUNTER — Ambulatory Visit (INDEPENDENT_AMBULATORY_CARE_PROVIDER_SITE_OTHER): Payer: 59 | Admitting: Clinical

## 2021-01-14 ENCOUNTER — Other Ambulatory Visit: Payer: Self-pay

## 2021-01-14 DIAGNOSIS — F419 Anxiety disorder, unspecified: Secondary | ICD-10-CM

## 2021-01-14 DIAGNOSIS — F39 Unspecified mood [affective] disorder: Secondary | ICD-10-CM

## 2021-01-14 NOTE — Progress Notes (Signed)
   THERAPIST PROGRESS NOTE  Session Time: 8am  Participation Level: Active  Behavioral Response: Well GroomedAlertpleasant  Type of Therapy: Individual Therapy  Treatment Goals addressed: Coping  Interventions: Supportive  Location-[facility]  Summary: Cristal Qadir is a 60 y.o. 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 Pt continues to report having a stressful relationship with her children. Pt states her children have blocked her calls and are not speaking to her. Pt says this is bothersome as she is also not able to communicate with her grandchildren. Per pt report, most recent incident involved her daughter becoming upset with her and refusing to speak with her again because the pt declines to take psychotropic medication. According to pt, she finds attending outpatient therapy beneficial and declines medication management at this time. Pt reports she has her evaluation/hearing on 3/17.  Suicidal/Homicidal: Pt denies any SI/HI no plan or intent to harm self or others reported. Pt states having list of mental health resources provided to her in previous session and agreeable to utilize supports in the event of an emergency.   Therapist Response: CSW assessed for changes in mood and behavior. CSW reviewed with pt self care tips and addressed pt concerns about implementing self care. CSW processed with pt importance of her being kind to herself when having a bad day or experiencing a challenging situation. CSW discussed with pt potential benefits of getting involved in volunteer opportunities. Pt says helping others is something she enjoys and brings her fulfillment.  Plan: Return again in 1 weeks.  Diagnosis: Axis I: Mood disorder Anxiety    Axis II: No diagnosis    Yvette Rack, LCSW 01/14/2021

## 2021-01-20 ENCOUNTER — Other Ambulatory Visit: Payer: Self-pay

## 2021-01-20 ENCOUNTER — Other Ambulatory Visit: Payer: 59

## 2021-01-20 ENCOUNTER — Encounter: Payer: Self-pay | Admitting: Gastroenterology

## 2021-01-20 ENCOUNTER — Ambulatory Visit (INDEPENDENT_AMBULATORY_CARE_PROVIDER_SITE_OTHER): Payer: 59 | Admitting: Gastroenterology

## 2021-01-20 VITALS — BP 120/72 | HR 57 | Ht 61.0 in | Wt 135.6 lb

## 2021-01-20 DIAGNOSIS — R1012 Left upper quadrant pain: Secondary | ICD-10-CM | POA: Diagnosis not present

## 2021-01-20 DIAGNOSIS — K3189 Other diseases of stomach and duodenum: Secondary | ICD-10-CM | POA: Diagnosis not present

## 2021-01-20 DIAGNOSIS — Z1211 Encounter for screening for malignant neoplasm of colon: Secondary | ICD-10-CM | POA: Diagnosis not present

## 2021-01-20 MED ORDER — SUPREP BOWEL PREP KIT 17.5-3.13-1.6 GM/177ML PO SOLN: 1.0000 | ORAL | 0 refills | Status: DC

## 2021-01-20 NOTE — Progress Notes (Signed)
HPI: This is a very pleasant 60 year old woman who was referred to me by Elby Showers, MD  to evaluate abnormal duodenum.    She was in the emergency room 3 months ago for a suicidal attempt.  During that she underwent x-ray tests and blood work.  CT scan was read as below.  Prior to that ER visit she was really not having any type of GI symptoms however in the past 2 to 3 weeks she has noticed some bloating without nausea or vomiting.  She is also having intermittent brief sharp left upper quadrant abdominal pain.  This happens several times a day and is brief, lasts only seconds.  She is having some gurgling in her abdomen.  She has mild chronic constipation.  She does not have any pyrosis.  Her weight is up 3 pounds in the past several months  She was adopted, for raise a foster home and does not know her biologic parents.  She has had H. pylori infection in the past.  She takes Mobic daily and Aleve 1 pill about twice per month.  She was last here in our office at least 10 years ago.  Old Data Reviewed: "CT scan angiograph chest abdomen pelvis for dissection" January 2022 Stomach/Bowel: There is an avidly enhancing intramural mass at the ligament of Treitz involving the a left lateral wall of the bowel likely representing a a gastrointestinal stromal tumor measuring 1.8 x 3.2 cm in greatest dimension on axial image # 134/7 and coronal image # 49/10. The stomach, small bowel, and large bowel are otherwise unremarkable. Appendix normal. No free intraperitoneal gas or fluid.  I reviewed the images of the January CT scan personally.  The enhancing duodenal wall lesion is quite obvious and I agree with the radiology assessment above.  This would be very difficult to approach with an endoscopic ultrasound device given its location in the very distal duodenum.  Lab work January 2022 normal complete metabolic profile, normal CBC      Review of systems: Pertinent positive and negative  review of systems were noted in the above HPI section. All other review negative.   Past Medical History:  Diagnosis Date  . Anxiety   . Arthritis   . Helicobacter pylori (H. pylori) 07/2010  . Migraine     Past Surgical History:  Procedure Laterality Date  . CATARACT EXTRACTION     bilateral  . CERVICAL FUSION    . TUBAL LIGATION    . UPPER GASTROINTESTINAL ENDOSCOPY    . VAGINAL HYSTERECTOMY  2001   USO by history-sono2011 question of both ovaries present    Current Outpatient Medications  Medication Sig Dispense Refill  . amoxicillin (AMOXIL) 500 MG tablet Take 500 mg by mouth every 8 (eight) hours.    Marland Kitchen estradiol (MINIVELLE) 0.1 MG/24HR patch Place 1 patch (0.1 mg total) onto the skin 2 (two) times a week. 8 patch 12  . meloxicam (MOBIC) 15 MG tablet Take 15 mg by mouth daily.    . Multiple Vitamin (MULTIVITAMIN ADULT PO) Take by mouth.    . polyethylene glycol (MIRALAX / GLYCOLAX) 17 g packet Take 17 g by mouth 2 (two) times daily as needed. (May buy from over the counter): For constipation (Patient taking differently: Take 17 g by mouth every other day. (May buy from over the counter): For constipation) 14 each 0  . rosuvastatin (CRESTOR) 5 MG tablet Take 1 tablet (5 mg total) by mouth daily. 36 tablet 0  .  topiramate (TOPAMAX) 200 MG tablet Take 1 tablet (200 mg total) by mouth 2 (two) times daily. For mood stabilization 60 tablet 0   No current facility-administered medications for this visit.    Allergies as of 01/20/2021  . (No Known Allergies)    Family History  Adopted: Yes  Problem Relation Age of Onset  . Hypertension Mother   . Mental illness Mother   . Hypertension Brother     Social History   Socioeconomic History  . Marital status: Divorced    Spouse name: Not on file  . Number of children: 1  . Years of education: 62  . Highest education level: Not on file  Occupational History  . Not on file  Tobacco Use  . Smoking status: Former Smoker     Types: Cigarettes  . Smokeless tobacco: Never Used  Vaping Use  . Vaping Use: Never used  Substance and Sexual Activity  . Alcohol use: Yes    Alcohol/week: 0.0 standard drinks    Comment: Very rare  . Drug use: Not Currently    Types: Benzodiazepines  . Sexual activity: Yes    Birth control/protection: Post-menopausal, Surgical    Comment: 1st intercourse 60 yo-More than 5 partners  Other Topics Concern  . Not on file  Social History Narrative   Pt went into the foster system at the age of 72. She as in 4 really bad foster homes. She was raised in [place]. Pt has one brother. Pt graduated HS. Pt lives in [place] and has one son and 2 daughters. Divorced x3. 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    Social Determinants of Health   Financial Resource Strain: Not on file  Food Insecurity: Not on file  Transportation Needs: Not on file  Physical Activity: Not on file  Stress: Not on file  Social Connections: Not on file  Intimate Partner Violence: Not on file     Physical Exam: BP 120/72   Pulse (!) 57   Ht 5\' 1"  (1.549 m)   Wt 135 lb 9.6 oz (61.5 kg)   LMP 11/02/2000   BMI 25.62 kg/m  Constitutional: generally well-appearing Psychiatric: alert and oriented x3 Eyes: extraocular movements intact Mouth: oral pharynx moist, no lesions Neck: supple no lymphadenopathy Cardiovascular: heart regular rate and rhythm Lungs: clear to auscultation bilaterally Abdomen: soft, nontender, nondistended, no obvious ascites, no peritoneal signs, normal bowel sounds Extremities: no lower extremity edema bilaterally Skin: no lesions on visible extremities   Assessment and plan: 60 y.o. female with distal duodenal mass, history of H. pylori infection, left upper quadrant pain, chronic mild constipation, routine risk for colon cancer  First she understands that she does have some type of a growth involving her distal duodenum.  This is possibly submucosal.   Unfortunately endoscopic ultrasound at this location is not feasible or possible given the stiff and flexible nature of the distal several cm of the scope.  I think it is possibly reachable with a pediatric colonoscope and I recommended that we perform upper endoscopy for her with the goal of trying to reach this lesion, biopsy if needed if located.  I also examined her stomach for her intermittent left upper quadrant pains.  She has had H. pylori infection in the past and we will get stool testing to see if that has been eradicated.  At the same time as upper endoscopy I recommended screening colonoscopy for her since she has not had one in 10  to 12 years from what she can recall.  Lastly we are going to refer her to [facility] surgery because I think it is almost certain that she will require surgical resection of this distal duodenal mass.  Please see the "Patient Instructions" section for addition details about the plan.   Owens Loffler, MD [facility] 01/20/2021, 8:34 AM  Cc: Elby Showers, MD  Total time on date of encounter was 60  minutes (this included time spent preparing to see the patient reviewing records; obtaining and/or reviewing separately obtained history; performing a medically appropriate exam and/or evaluation; counseling and educating the patient and family if present; ordering medications, tests or procedures if applicable; and documenting clinical information in the health record).

## 2021-01-20 NOTE — Patient Instructions (Addendum)
If you are age 59 or younger, your body mass index should be between 19-25. Your Body mass index is 25.62 kg/m. If this is out of the aformentioned range listed, please consider follow up with your Primary Care Provider.   Your provider has requested that you go to the basement level for lab work before leaving today. Press "B" on the elevator. The lab is located at the first door on the left as you exit the elevator.  Due to recent changes in healthcare laws, you may see the results of your imaging and laboratory studies on MyChart before your provider has had a chance to review them.  We understand that in some cases there may be results that are confusing or concerning to you. Not all laboratory results come back in the same time frame and the provider may be waiting for multiple results in order to interpret others.  Please give [place] 48 hours in order for your provider to thoroughly review all the results before contacting the office for clarification of your results.   You have been scheduled for an endoscopy and colonoscopy. Please follow the written instructions given to you at your visit today. Please pick up your prep supplies at the pharmacy within the next 1-3 days. If you use inhalers (even only as needed), please bring them with you on the day of your procedure.  You have been scheduled for an appointment with ________ at [facility]. Your appointment is on _______ at ________. Please arrive at ________ for registration. Make certain to bring a list of current medications, including any over the counter medications or vitamins. Also bring your co-pay if you have one as well as your insurance cards. [facility] is located at [address]. Should you need to reschedule your appointment, please contact them at [phone].  Thank you for entrusting me with your care and choosing [facility].  Dr Ardis Hughs

## 2021-01-22 ENCOUNTER — Other Ambulatory Visit: Payer: 59

## 2021-01-22 DIAGNOSIS — K3189 Other diseases of stomach and duodenum: Secondary | ICD-10-CM

## 2021-01-22 DIAGNOSIS — Z1211 Encounter for screening for malignant neoplasm of colon: Secondary | ICD-10-CM

## 2021-01-22 DIAGNOSIS — R1012 Left upper quadrant pain: Secondary | ICD-10-CM

## 2021-01-24 ENCOUNTER — Other Ambulatory Visit: Payer: Self-pay

## 2021-01-24 ENCOUNTER — Ambulatory Visit (AMBULATORY_SURGERY_CENTER): Payer: 59 | Admitting: Gastroenterology

## 2021-01-24 ENCOUNTER — Encounter: Payer: Self-pay | Admitting: Gastroenterology

## 2021-01-24 VITALS — BP 118/71 | HR 54 | Temp 96.8°F | Resp 15 | Ht 61.0 in | Wt 135.0 lb

## 2021-01-24 DIAGNOSIS — D124 Benign neoplasm of descending colon: Secondary | ICD-10-CM | POA: Diagnosis not present

## 2021-01-24 DIAGNOSIS — K2970 Gastritis, unspecified, without bleeding: Secondary | ICD-10-CM | POA: Diagnosis not present

## 2021-01-24 DIAGNOSIS — K3189 Other diseases of stomach and duodenum: Secondary | ICD-10-CM

## 2021-01-24 DIAGNOSIS — Z1211 Encounter for screening for malignant neoplasm of colon: Secondary | ICD-10-CM | POA: Diagnosis not present

## 2021-01-24 LAB — H. PYLORI ANTIGEN, STOOL: H pylori Ag, Stl: NEGATIVE

## 2021-01-24 MED ORDER — SODIUM CHLORIDE 0.9 % IV SOLN: 500.0000 mL | Freq: Once | INTRAVENOUS | Status: DC

## 2021-01-24 NOTE — Op Note (Signed)
[place] Patient Name: [name] [name] Procedure Date: 01/24/2021 10:03 AM MRN: [id number] Endoscopist: Milus Banister , MD Age: 60 Referring MD:  Date of Birth: 1961/03/12 Gender: Female Account #: [id number] Procedure:                Colonoscopy Indications:              Screening for colorectal malignant neoplasm Medicines:                Monitored Anesthesia Care Procedure:                Pre-Anesthesia Assessment:                           - Prior to the procedure, a History and Physical                            was performed, and patient medications and                            allergies were reviewed. The patient's tolerance of                            previous anesthesia was also reviewed. The risks                            and benefits of the procedure and the sedation                            options and risks were discussed with the patient.                            All questions were answered, and informed consent                            was obtained. Prior Anticoagulants: The patient has                            taken no previous anticoagulant or antiplatelet                            agents. ASA Grade Assessment: II - A patient with                            mild systemic disease. After reviewing the risks                            and benefits, the patient was deemed in                            satisfactory condition to undergo the procedure.                           After obtaining informed consent, the colonoscope  was passed under direct vision. Throughout the                            procedure, the patient's blood pressure, pulse, and                            oxygen saturations were monitored continuously. The                            Olympus PCF-H190DL (#[id number]) Colonoscope was                            introduced through the anus and advanced to the the                            cecum,  identified by appendiceal orifice and                            ileocecal valve. The colonoscopy was performed                            without difficulty. The patient tolerated the                            procedure well. The quality of the bowel                            preparation was good. The ileocecal valve,                            appendiceal orifice, and rectum were photographed. Scope In: 10:12:55 AM Scope Out: 10:22:46 AM Scope Withdrawal Time: 0 hours 6 minutes 36 seconds  Total Procedure Duration: 0 hours 9 minutes 51 seconds  Findings:                 A 5 mm polyp was found in the descending colon. The                            polyp was sessile. The polyp was removed with a                            cold snare. Resection and retrieval were complete.                           Internal hemorrhoids were found. The hemorrhoids                            were medium-sized.                           The exam was otherwise without abnormality on                            direct and retroflexion views. Complications:  No immediate complications. Estimated blood loss:                            None. Estimated Blood Loss:     Estimated blood loss: none. Impression:               - One 5 mm polyp in the descending colon, removed                            with a cold snare. Resected and retrieved.                           - Internal hemorrhoids.                           - The examination was otherwise normal on direct                            and retroflexion views. Recommendation:           - EGD/enteroscopy now.                           - Await final pathology results. Milus Banister, MD 01/24/2021 10:24:57 AM This report has been signed electronically.

## 2021-01-24 NOTE — Op Note (Signed)
[place] Patient Name: [name] [name] Procedure Date: 01/24/2021 10:24 AM MRN: [id number] Endoscopist: Milus Banister , MD Age: 60 Referring MD:  Date of Birth: 03-24-1961 Gender: Female Account #: [id number] Procedure:                Upper GI endoscopy, enteroscopy Indications:              Abdominal pain in the left upper quadrant, also                            known distal duodenum mass (submucosal?) on recent                            CT Medicines:                Monitored Anesthesia Care Procedure:                Pre-Anesthesia Assessment:                           - Prior to the procedure, a History and Physical                            was performed, and patient medications and                            allergies were reviewed. The patient's tolerance of                            previous anesthesia was also reviewed. The risks                            and benefits of the procedure and the sedation                            options and risks were discussed with the patient.                            All questions were answered, and informed consent                            was obtained. Prior Anticoagulants: The patient has                            taken no previous anticoagulant or antiplatelet                            agents. ASA Grade Assessment: II - A patient with                            mild systemic disease. After reviewing the risks                            and benefits, the patient was deemed in  satisfactory condition to undergo the procedure.                           After obtaining informed consent, the endoscope was                            passed under direct vision. Throughout the                            procedure, the patient's blood pressure, pulse, and                            oxygen saturations were monitored continuously. The                            Olympus PFC-H190DL (#[id number])  Colonoscope was                            introduced through the mouth, and advanced to the                            proximal jejunum. The upper GI endoscopy was                            accomplished without difficulty. The patient                            tolerated the procedure well. Scope In: Scope Out: Findings:                 Mild inflammation characterized by erythema and                            granularity was found in the gastric antrum.                            Biopsies were taken with a cold forceps for                            histology.                           The exam was otherwise without abnormality to the                            extent of the examination (apparent proximal                            jejunum) using a pediatric colonoscope. Complications:            No immediate complications. Estimated blood loss:                            None. Estimated Blood Loss:     Estimated blood loss: none. Impression:               -  Mild, non-specific gastritis. Biopsied to check                            for H. pylori.                           - The exam was otherwise without abnormality to the                            extent of the examination (apparent proximal                            jejunum) using a pediatric colonoscope.                           - The lesion noted on recent CT scan which involves                            the very distal duodenum is probably completely                            submucosal, the site was not evident on this                            examination. You should continue with plans for                            surgical evaluation next month to discuss elective                            resection. Recommendation:           - Patient has a contact number available for                            emergencies. The signs and symptoms of potential                            delayed complications were discussed with the                             patient. Return to normal activities tomorrow.                            Written discharge instructions were provided to the                            patient.                           - Resume previous diet.                           - Continue present medications.                           -  Await pathology results. Milus Banister, MD 01/24/2021 10:39:03 AM This report has been signed electronically.

## 2021-01-24 NOTE — Progress Notes (Signed)
Called to room to assist during endoscopic procedure.  Patient ID and intended procedure confirmed with present staff. Received instructions for my participation in the procedure from the performing physician.  

## 2021-01-24 NOTE — Patient Instructions (Signed)
Impression/Recommendations:  Gastritis, polyp, and hemorrhoid handouts given to patient.  Resume previous diet. Continue present medications. Await pathology results.  YOU HAD AN ENDOSCOPIC PROCEDURE TODAY AT [facility]:   Refer to the procedure report that was given to you for any specific questions about what was found during the examination.  If the procedure report does not answer your questions, please call your gastroenterologist to clarify.  If you requested that your care partner not be given the details of your procedure findings, then the procedure report has been included in a sealed envelope for you to review at your convenience later.  YOU SHOULD EXPECT: Some feelings of bloating in the abdomen. Passage of more gas than usual.  Walking can help get rid of the air that was put into your GI tract during the procedure and reduce the bloating. If you had a lower endoscopy (such as a colonoscopy or flexible sigmoidoscopy) you may notice spotting of blood in your stool or on the toilet paper. If you underwent a bowel prep for your procedure, you may not have a normal bowel movement for a few days.  Please Note:  You might notice some irritation and congestion in your nose or some drainage.  This is from the oxygen used during your procedure.  There is no need for concern and it should clear up in a day or so.  SYMPTOMS TO REPORT IMMEDIATELY:   Following lower endoscopy (colonoscopy or flexible sigmoidoscopy):  Excessive amounts of blood in the stool  Significant tenderness or worsening of abdominal pains  Swelling of the abdomen that is new, acute  Fever of 100F or higher   Following upper endoscopy (EGD)  Vomiting of blood or coffee ground material  New chest pain or pain under the shoulder blades  Painful or persistently difficult swallowing  New shortness of breath  Fever of 100F or higher  Black, tarry-looking stools  For urgent or emergent issues, a  gastroenterologist can be reached at any hour by calling [phone]. Do not use MyChart messaging for urgent concerns.    DIET:  We do recommend a small meal at first, but then you may proceed to your regular diet.  Drink plenty of fluids but you should avoid alcoholic beverages for 24 hours.  ACTIVITY:  You should plan to take it easy for the rest of today and you should NOT DRIVE or use heavy machinery until tomorrow (because of the sedation medicines used during the test).    FOLLOW UP: Our staff will call the number listed on your records 48-72 hours following your procedure to check on you and address any questions or concerns that you may have regarding the information given to you following your procedure. If we do not reach you, we will leave a message.  We will attempt to reach you two times.  During this call, we will ask if you have developed any symptoms of COVID 19. If you develop any symptoms (ie: fever, flu-like symptoms, shortness of breath, cough etc.) before then, please call [phone].  If you test positive for Covid 19 in the 2 weeks post procedure, please call and report this information to [place].    If any biopsies were taken you will be contacted by phone or by letter within the next 1-3 weeks.  Please call us at [phone] if you have not heard about the biopsies in 3 weeks.    SIGNATURES/CONFIDENTIALITY: You and/or your care partner have signed paperwork which  will be entered into your electronic medical record.  These signatures attest to the fact that that the information above on your After Visit Summary has been reviewed and is understood.  Full responsibility of the confidentiality of this discharge information lies with you and/or your care-partner.

## 2021-01-24 NOTE — Progress Notes (Signed)
VS by CW  Pt's states no medical or surgical changes since previsit or office visit.  

## 2021-01-28 ENCOUNTER — Ambulatory Visit (INDEPENDENT_AMBULATORY_CARE_PROVIDER_SITE_OTHER): Payer: 59 | Admitting: Clinical

## 2021-01-28 ENCOUNTER — Other Ambulatory Visit: Payer: Self-pay

## 2021-01-28 ENCOUNTER — Telehealth: Payer: Self-pay | Admitting: *Deleted

## 2021-01-28 DIAGNOSIS — F39 Unspecified mood [affective] disorder: Secondary | ICD-10-CM | POA: Diagnosis not present

## 2021-01-28 DIAGNOSIS — F419 Anxiety disorder, unspecified: Secondary | ICD-10-CM

## 2021-01-28 NOTE — Telephone Encounter (Signed)
  Follow up Call-  Call back number 01/24/2021  Post procedure Call Back phone  # [phone]  Permission to leave phone message Yes  Some recent data might be hidden     Patient questions:  Do you have a fever, pain , or abdominal swelling? No. Pain Score  0 *  Have you tolerated food without any problems? Yes.    Have you been able to return to your normal activities? Yes.    Do you have any questions about your discharge instructions: Diet   No. Medications  No. Follow up visit  No.  Do you have questions or concerns about your Care? No.  Actions: * If pain score is 4 or above: No action needed, pain <4.  1. Have you developed a fever since your procedure? no  2.   Have you had an respiratory symptoms (SOB or cough) since your procedure? no  3.   Have you tested positive for COVID 19 since your procedure no  4.   Have you had any family members/close contacts diagnosed with the COVID 19 since your procedure?  no   If yes to any of these questions please route to Joylene John, RN and Joella Prince, RN

## 2021-01-28 NOTE — Progress Notes (Signed)
THERAPIST PROGRESS NOTE  Session Time: 8am  Participation Level: Active  Behavioral Response: Casual and NeatAlertEuthymic  Type of Therapy: Individual Therapy  Treatment Goals addressed: Anxiety and Coping  Interventions: Assertiveness Training  Summary: [name] [name] is a 60 y.o. 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 Pt says she was able to spend time with her oldest daughter and grandchildren recently. Pt reports she attended the competency hearing and was ruled competent and not in need of a guardian. Pt says she has no difficulty with completing ADL's, maintaining her household or making sure her needs are being met. Pt discussed having a history of mood imbalance. Pt states she was prescribed lamictal for several years and it has been 10 years since her last dosage. Pt endorses having mood swings and states wanting a referral for medication management. No other sxs reported at this time.  Suicidal/Homicidal: Pt denies SI/HI no plan or attempt to harm self or others reported.  Therapist Response: CSW provided pt with information on setting personal boundaries. CSW discussed with pt importance of establishing and maintaining healthy boundaries. CSW probed for feedback on consequences of not establishing boundaries in relationships. CSW actively listened as pt discussed lack of boundaries in relationship with her children. Per pt request CSW provided pt with contact information to [facility] for medication management referral.  Plan: Return again in 1 weeks.  Diagnosis: Axis I: Unspecified Mood disorder Anxiety    Axis II: No diagnosis    Yvette Rack, LCSW 01/28/2021

## 2021-02-06 ENCOUNTER — Telehealth: Payer: Self-pay | Admitting: Internal Medicine

## 2021-02-06 NOTE — Telephone Encounter (Signed)
scheduled

## 2021-02-06 NOTE — Telephone Encounter (Signed)
[name] [name] [phone]  [name] called to say she has yeast infection, itching and discharge for 4-5 days, she tried Monistat over the counter but it has not helped. Would like to come in tomorrow.

## 2021-02-06 NOTE — Telephone Encounter (Signed)
ok 

## 2021-02-07 ENCOUNTER — Other Ambulatory Visit: Payer: Self-pay

## 2021-02-07 ENCOUNTER — Ambulatory Visit (INDEPENDENT_AMBULATORY_CARE_PROVIDER_SITE_OTHER): Payer: 59 | Admitting: Internal Medicine

## 2021-02-07 ENCOUNTER — Encounter: Payer: Self-pay | Admitting: Internal Medicine

## 2021-02-07 VITALS — BP 100/70 | HR 68 | Temp 97.7°F | Ht 61.0 in | Wt 135.0 lb

## 2021-02-07 DIAGNOSIS — R350 Frequency of micturition: Secondary | ICD-10-CM

## 2021-02-07 DIAGNOSIS — B3731 Acute candidiasis of vulva and vagina: Secondary | ICD-10-CM

## 2021-02-07 DIAGNOSIS — B373 Candidiasis of vulva and vagina: Secondary | ICD-10-CM

## 2021-02-07 LAB — POCT URINALYSIS DIPSTICK
Appearance: NEGATIVE
Bilirubin, UA: NEGATIVE
Blood, UA: NEGATIVE
Glucose, UA: NEGATIVE
Ketones, UA: NEGATIVE
Leukocytes, UA: NEGATIVE
Nitrite, UA: NEGATIVE
Odor: NEGATIVE
Protein, UA: NEGATIVE
Spec Grav, UA: 1.01 (ref 1.010–1.025)
Urobilinogen, UA: 0.2 E.U./dL
pH, UA: 6.5 (ref 5.0–8.0)

## 2021-02-07 MED ORDER — FLUCONAZOLE 150 MG PO TABS: 150.0000 mg | ORAL_TABLET | Freq: Once | ORAL | 1 refills | Status: AC

## 2021-02-07 NOTE — Progress Notes (Signed)
   Subjective:    Patient ID: [name] [name], female    DOB: 09-Sep-1961, 60 y.o.   MRN: [id number]  HPI 60 year old Female well-known to this practice seen today with complaint of vaginal itching.  Has had this for 4-5 days.  She is postmenopausal.  Denies being sexually active.  No vaginal discharge.  She tried Monistat over-the-counter but this did not help her symptoms.  Patient has history of mood disorder and currently is in counseling.  Currently is on Topamax 200 mg twice daily for mood disorder.  Review of Systems complaining of frequent urination.  Dipstick UA is within normal limits.  No glucosuria.  No LE or nitrite.     Objective:   Physical Exam vital signs reviewed.  Blood pressure 100/70, pulse 68, temperature 97.7 BMI 25.51 pulse oximetry 98% weight 135 pounds  Normal female external genitalia.  There is very slight white vaginal discharge and wet prep is consistent with yeasts and budding yeasts.  Occasional white cells noted on wet prep..      Assessment & Plan:  Candida vaginitis  Plan: Patient prefers oral medication rather than vaginal cream for Candida vaginitis.  Was given Diflucan 150 mg tablet with 1 refill.  May take second dose of Diflucan if not markedly improved 3 days after taking initial dose.

## 2021-02-12 ENCOUNTER — Other Ambulatory Visit: Payer: Self-pay

## 2021-02-12 ENCOUNTER — Ambulatory Visit (INDEPENDENT_AMBULATORY_CARE_PROVIDER_SITE_OTHER): Payer: 59 | Admitting: Clinical

## 2021-02-12 DIAGNOSIS — F39 Unspecified mood [affective] disorder: Secondary | ICD-10-CM | POA: Diagnosis not present

## 2021-02-12 DIAGNOSIS — F419 Anxiety disorder, unspecified: Secondary | ICD-10-CM | POA: Diagnosis not present

## 2021-02-12 NOTE — Progress Notes (Signed)
   THERAPIST PROGRESS NOTE  Session Time: 8am  Participation Level: Active  Behavioral Response: Neat and Well GroomedAlertEuthymic  Type of Therapy: Individual Therapy  Treatment Goals addressed: Anxiety  Interventions: Assertiveness Training  Summary: [name] [name] is a 60 y.o. female who presents in a euthymic mood. Pt reports she went to [facility] on 4/11 for medication management appointment. Pt reports she has been prescribed Lamictal and Xanax and is responding well to the medication. Pt states she has taken lamictal in the past and found it beneficial for mood management. 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 Pt discussed recent event with daughter and reports daughter is now not speaking to her. Nevertheless pt states her son contacted her and they had a pleasant conversation.  Suicidal/Homicidal: Pt denies SI/HI no plan or attempt to harm self or others reported.  Therapist Response: CSW assessed for changes in mood and behavior. CSW reviewed with pt setting personal boundaries. CSW continues to process with pt about the importance of establishing healthy boundaries in personal and professional relationships. CSW modeled for pt how to use I statements to convey a message/express feelings. CSW roles played with pt using I messages and provided feedback. CSW continues to encourage pt to practice daily self care.  Plan: Pt says she may have to schedule appt for 1x per month due to cost of specialist visit. Pt says she plans to contact insurance provider to discuss cost of visit.  Diagnosis: Axis I: Unspecified Mood disorder Anxiety    Axis II: No diagnosis    Yvette Rack, LCSW 02/12/2021

## 2021-02-21 ENCOUNTER — Other Ambulatory Visit: Payer: 59 | Admitting: Internal Medicine

## 2021-02-21 ENCOUNTER — Other Ambulatory Visit: Payer: Self-pay

## 2021-02-21 DIAGNOSIS — Z1321 Encounter for screening for nutritional disorder: Secondary | ICD-10-CM

## 2021-02-21 DIAGNOSIS — F332 Major depressive disorder, recurrent severe without psychotic features: Secondary | ICD-10-CM

## 2021-02-21 DIAGNOSIS — E7800 Pure hypercholesterolemia, unspecified: Secondary | ICD-10-CM

## 2021-02-21 DIAGNOSIS — Z0000 Encounter for general adult medical examination without abnormal findings: Secondary | ICD-10-CM

## 2021-02-21 DIAGNOSIS — F4321 Adjustment disorder with depressed mood: Secondary | ICD-10-CM

## 2021-02-21 DIAGNOSIS — Z1329 Encounter for screening for other suspected endocrine disorder: Secondary | ICD-10-CM

## 2021-02-21 DIAGNOSIS — Z8659 Personal history of other mental and behavioral disorders: Secondary | ICD-10-CM

## 2021-02-22 LAB — LIPID PANEL
Cholesterol: 151 mg/dL (ref <200)
HDL: 51 mg/dL (ref >=50)
LDL Cholesterol (Calc): 87 mg/dL (calc)
Non-HDL Cholesterol (Calc): 100 mg/dL (calc) (ref <130)
Total CHOL/HDL Ratio: 3 (calc) (ref <5.0)
Triglycerides: 51 mg/dL (ref <150)

## 2021-02-22 LAB — COMPLETE METABOLIC PANEL WITH GFR
AG Ratio: 1.5 (calc) (ref 1.0–2.5)
ALT: 10 U/L (ref 6–29)
AST: 15 U/L (ref 10–35)
Albumin: 3.8 g/dL (ref 3.6–5.1)
Alkaline phosphatase (APISO): 38 U/L (ref 37–153)
BUN: 14 mg/dL (ref 7–25)
CO2: 22 mmol/L (ref 20–32)
Calcium: 8.6 mg/dL (ref 8.6–10.4)
Chloride: 111 mmol/L — ABNORMAL HIGH (ref 98–110)
Creat: 0.85 mg/dL (ref 0.50–1.05)
GFR, Est African American: 87 mL/min/{1.73_m2} (ref >=60)
GFR, Est Non African American: 75 mL/min/{1.73_m2} (ref >=60)
Globulin: 2.5 g/dL (calc) (ref 1.9–3.7)
Glucose, Bld: 86 mg/dL (ref 65–99)
Potassium: 4.3 mmol/L (ref 3.5–5.3)
Sodium: 139 mmol/L (ref 135–146)
Total Bilirubin: 0.3 mg/dL (ref 0.2–1.2)
Total Protein: 6.3 g/dL (ref 6.1–8.1)

## 2021-02-22 LAB — CBC WITH DIFFERENTIAL/PLATELET
Absolute Monocytes: 470 cells/uL (ref 200–950)
Basophils Absolute: 48 cells/uL (ref 0–200)
Basophils Relative: 1 %
Eosinophils Absolute: 101 cells/uL (ref 15–500)
Eosinophils Relative: 2.1 %
HCT: 41.2 % (ref 35.0–45.0)
Hemoglobin: 13.4 g/dL (ref 11.7–15.5)
Lymphs Abs: 1330 cells/uL (ref 850–3900)
MCH: 30.9 pg (ref 27.0–33.0)
MCHC: 32.5 g/dL (ref 32.0–36.0)
MCV: 95.2 fL (ref 80.0–100.0)
MPV: 10.8 fL (ref 7.5–12.5)
Monocytes Relative: 9.8 %
Neutro Abs: 2851 cells/uL (ref 1500–7800)
Neutrophils Relative %: 59.4 %
Platelets: 180 10*3/uL (ref 140–400)
RBC: 4.33 10*6/uL (ref 3.80–5.10)
RDW: 12.3 % (ref 11.0–15.0)
Total Lymphocyte: 27.7 %
WBC: 4.8 10*3/uL (ref 3.8–10.8)

## 2021-02-22 LAB — TSH: TSH: 1.11 mIU/L (ref 0.40–4.50)

## 2021-02-22 LAB — VITAMIN D 25 HYDROXY (VIT D DEFICIENCY, FRACTURES): Vit D, 25-Hydroxy: 47 ng/mL (ref 30–100)

## 2021-02-24 ENCOUNTER — Encounter: Payer: Self-pay | Admitting: Internal Medicine

## 2021-02-24 ENCOUNTER — Other Ambulatory Visit: Payer: Self-pay

## 2021-02-24 ENCOUNTER — Other Ambulatory Visit: Payer: Self-pay | Admitting: General Surgery

## 2021-02-24 ENCOUNTER — Ambulatory Visit (INDEPENDENT_AMBULATORY_CARE_PROVIDER_SITE_OTHER): Payer: 59 | Admitting: Internal Medicine

## 2021-02-24 VITALS — BP 110/80 | HR 78 | Temp 97.8°F | Ht 61.0 in | Wt 137.0 lb

## 2021-02-24 DIAGNOSIS — Z8659 Personal history of other mental and behavioral disorders: Secondary | ICD-10-CM

## 2021-02-24 DIAGNOSIS — Z23 Encounter for immunization: Secondary | ICD-10-CM | POA: Diagnosis not present

## 2021-02-24 DIAGNOSIS — K3189 Other diseases of stomach and duodenum: Secondary | ICD-10-CM

## 2021-02-24 DIAGNOSIS — Z8669 Personal history of other diseases of the nervous system and sense organs: Secondary | ICD-10-CM

## 2021-02-24 DIAGNOSIS — E7800 Pure hypercholesterolemia, unspecified: Secondary | ICD-10-CM | POA: Diagnosis not present

## 2021-02-24 DIAGNOSIS — M5431 Sciatica, right side: Secondary | ICD-10-CM

## 2021-02-24 DIAGNOSIS — Z0000 Encounter for general adult medical examination without abnormal findings: Secondary | ICD-10-CM | POA: Diagnosis not present

## 2021-02-24 DIAGNOSIS — Z9109 Other allergy status, other than to drugs and biological substances: Secondary | ICD-10-CM

## 2021-02-24 DIAGNOSIS — K5904 Chronic idiopathic constipation: Secondary | ICD-10-CM

## 2021-02-24 LAB — POCT URINALYSIS DIPSTICK
Appearance: NEGATIVE
Bilirubin, UA: NEGATIVE
Blood, UA: NEGATIVE
Glucose, UA: NEGATIVE
Ketones, UA: NEGATIVE
Leukocytes, UA: NEGATIVE
Nitrite, UA: NEGATIVE
Odor: NEGATIVE
Protein, UA: NEGATIVE
Spec Grav, UA: 1.01 (ref 1.010–1.025)
Urobilinogen, UA: 0.2 E.U./dL
pH, UA: 6.5 (ref 5.0–8.0)

## 2021-02-24 MED ORDER — HYDROCODONE-ACETAMINOPHEN 5-325 MG PO TABS: 1.0000 | ORAL_TABLET | Freq: Four times a day (QID) | ORAL | 0 refills | Status: DC | PRN
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
## 2021-02-24 NOTE — Patient Instructions (Addendum)
It was a pleasure to see you today.  Take Norco 5/325 sparingly up to every 8 hours if needed for severe back pain.  Good luck with upcoming surgery.  Continue Crestor for hyperlipidemia.  Continue her medications for depression.  Return in 6 months or as needed.  Tetanus update given today.

## 2021-02-28 NOTE — Patient Instructions (Addendum)
You have been diagnosed with Candida vaginitis.  Please take 1 dose of Diflucan by mouth.  A refill has been given on this medication.  Repeat this dose if not better in 3 days.

## 2021-03-19 ENCOUNTER — Other Ambulatory Visit: Payer: Self-pay | Admitting: Internal Medicine

## 2021-04-04 ENCOUNTER — Other Ambulatory Visit: Payer: Self-pay

## 2021-04-04 ENCOUNTER — Ambulatory Visit (INDEPENDENT_AMBULATORY_CARE_PROVIDER_SITE_OTHER): Payer: 59 | Admitting: Internal Medicine

## 2021-04-04 ENCOUNTER — Telehealth: Payer: Self-pay | Admitting: Internal Medicine

## 2021-04-04 ENCOUNTER — Encounter: Payer: Self-pay | Admitting: Internal Medicine

## 2021-04-04 VITALS — BP 140/90 | HR 70 | Ht 61.0 in | Wt 129.0 lb

## 2021-04-04 DIAGNOSIS — M5416 Radiculopathy, lumbar region: Secondary | ICD-10-CM | POA: Diagnosis not present

## 2021-04-04 MED ORDER — HYDROCODONE-ACETAMINOPHEN 5-325 MG PO TABS: 1.0000 | ORAL_TABLET | Freq: Four times a day (QID) | ORAL | 0 refills | Status: DC | PRN

## 2021-04-04 MED ORDER — CYCLOBENZAPRINE HCL 5 MG PO TABS: 5.0000 mg | ORAL_TABLET | Freq: Three times a day (TID) | ORAL | 0 refills | Status: DC | PRN

## 2021-04-04 NOTE — Telephone Encounter (Signed)
She should NOT be out of this medication as she got 30 tabs on 4/25 and was told to use it sparingly. I cannot refill at this time.

## 2021-04-04 NOTE — Patient Instructions (Addendum)
Patient is to have 3 weeks of PT. 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 Hx cervical disc surgery by Dr. Ellene Route in the past in January 2003. Having to work more recently due to finances.  Her work involves physical labor cleaning houses.  Referral to physical therapy.  Used to take Flexeril 5 mg up to 3 times a day as needed for back pain.  Have refilled Norco 5/325 to take sparingly number 15 tablets with no refill.  If not better after 3 weeks of physical therapy we can refer her back to Dr. Ellene Route.

## 2021-04-04 NOTE — Telephone Encounter (Signed)
Called [facility] NORCO was last filled on 02/24/21 #30.

## 2021-04-04 NOTE — Telephone Encounter (Signed)
[name] [name] [phone]  [name] [name] called to say her back is still bothering her and she would like to get a refill on below medication.  HYDROcodone-acetaminophen (Dickson) 5-325 MG tablet  [facility] [place], [place] AT [facility] [place] Phone:  [phone]  Fax:  [phone]

## 2021-04-04 NOTE — Telephone Encounter (Signed)
Has to have OV.

## 2021-04-04 NOTE — Progress Notes (Signed)
   Subjective:    Patient ID: [name] [name], female    DOB: May 28, 1961, 60 y.o.   MRN: [id number]  HPI Here for follow up regarding ongoing low back pain. 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  She was prescribed hydrocodone APAP in April.  She has been taking this sparingly and is out of medication.  She continues to have right lumbar and buttock radiculopathy symptoms.  Has never had MRI of the LS spine but has longstanding history of back pain.  History of cervical disc surgery by Dr. Ellene Route C4-C5 and C5-C6 in January 2003.  He has never had back surgery.  I offered her prednisone but she does not tolerate it due to side effects.  She has a history of depressive disorder and bipolar disorder and is on Wellbutrin and lamotrigine.  Sees Darren [facility] for counseling at [facility]  History of GI stromal tumor and is under the care of Dr. Barry Dienes, general surgeon.  Robotic excision of duodenal mass was scheduled for June but has been canceled.  Patient resides alone in a condominium.  She has a son and a daughter but currently they are not speaking to her.      Review of Systems see above     Objective:   Physical Exam  Her muscle strength in the right lower extremity is normal.  Deep tendon reflexes 1+ and symmetrical in the knees.  Range of motion of the trunk is failure.      Assessment & Plan:  Right lumbar radiculopathy  Plan: [facility].  Explained to her we cannot get MRI of the back approved until she has been to physical therapy for 3 weeks.  Have E scribed Flexeril 5 mg 3 times a day as needed for back pain.  Is to take hydrocodone with APAP 5/325 sparingly but can only give her # 15 tablets and explained this to her today.  If not getting better after 3 weeks of physical therapy, then we can refer her to Neurosurgery.  I talked with  her about cutting back on her work but she does not feel that she can do this financially.  In fact, she has taken on additional clonus  [facility]ed to [facility]

## 2021-04-04 NOTE — Telephone Encounter (Signed)
scheduled

## 2021-04-08 ENCOUNTER — Inpatient Hospital Stay: Admit: 2021-04-08 | Payer: 59 | Admitting: General Surgery

## 2021-04-08 SURGERY — COLECTOMY, SIGMOID, ROBOT-ASSISTED
Anesthesia: General

## 2021-04-11 ENCOUNTER — Other Ambulatory Visit (HOSPITAL_COMMUNITY): Payer: 59

## 2021-10-23 ENCOUNTER — Other Ambulatory Visit: Payer: Self-pay | Admitting: Internal Medicine

## 2021-12-05 ENCOUNTER — Telehealth: Payer: Self-pay | Admitting: Internal Medicine

## 2021-12-09 MED ORDER — ESTRADIOL 0.1 MG/24HR TD PTTW: MEDICATED_PATCH | TRANSDERMAL | 0 refills | Status: DC

## 2021-12-09 NOTE — Telephone Encounter (Signed)
CPE scheduled for June

## 2021-12-09 NOTE — Addendum Note (Signed)
Addended by: Angus Seller on: 12/09/2021 04:16 PM   Modules accepted: Orders

## 2022-04-01 ENCOUNTER — Other Ambulatory Visit: Payer: Self-pay | Admitting: Internal Medicine

## 2022-04-13 ENCOUNTER — Other Ambulatory Visit: Payer: 59

## 2022-04-13 DIAGNOSIS — R5383 Other fatigue: Secondary | ICD-10-CM

## 2022-04-13 DIAGNOSIS — F4321 Adjustment disorder with depressed mood: Secondary | ICD-10-CM

## 2022-04-13 DIAGNOSIS — E7800 Pure hypercholesterolemia, unspecified: Secondary | ICD-10-CM

## 2022-04-13 DIAGNOSIS — R7301 Impaired fasting glucose: Secondary | ICD-10-CM

## 2022-04-13 DIAGNOSIS — F419 Anxiety disorder, unspecified: Secondary | ICD-10-CM

## 2022-04-14 ENCOUNTER — Ambulatory Visit (INDEPENDENT_AMBULATORY_CARE_PROVIDER_SITE_OTHER): Payer: 59 | Admitting: Internal Medicine

## 2022-04-14 ENCOUNTER — Telehealth: Payer: Self-pay | Admitting: Internal Medicine

## 2022-04-14 VITALS — BP 124/82 | HR 53 | Temp 98.0°F | Ht 61.0 in | Wt 130.0 lb

## 2022-04-14 DIAGNOSIS — Z8659 Personal history of other mental and behavioral disorders: Secondary | ICD-10-CM

## 2022-04-14 DIAGNOSIS — B353 Tinea pedis: Secondary | ICD-10-CM | POA: Diagnosis not present

## 2022-04-14 DIAGNOSIS — Z0000 Encounter for general adult medical examination without abnormal findings: Secondary | ICD-10-CM | POA: Diagnosis not present

## 2022-04-14 DIAGNOSIS — Z23 Encounter for immunization: Secondary | ICD-10-CM | POA: Diagnosis not present

## 2022-04-14 DIAGNOSIS — Z1231 Encounter for screening mammogram for malignant neoplasm of breast: Secondary | ICD-10-CM

## 2022-04-14 DIAGNOSIS — Z8669 Personal history of other diseases of the nervous system and sense organs: Secondary | ICD-10-CM

## 2022-04-14 DIAGNOSIS — E7800 Pure hypercholesterolemia, unspecified: Secondary | ICD-10-CM | POA: Diagnosis not present

## 2022-04-14 DIAGNOSIS — I728 Aneurysm of other specified arteries: Secondary | ICD-10-CM | POA: Diagnosis not present

## 2022-04-14 DIAGNOSIS — Z9109 Other allergy status, other than to drugs and biological substances: Secondary | ICD-10-CM

## 2022-04-14 DIAGNOSIS — K3189 Other diseases of stomach and duodenum: Secondary | ICD-10-CM

## 2022-04-14 DIAGNOSIS — K5904 Chronic idiopathic constipation: Secondary | ICD-10-CM

## 2022-04-14 DIAGNOSIS — M5416 Radiculopathy, lumbar region: Secondary | ICD-10-CM

## 2022-04-14 LAB — CBC WITH DIFFERENTIAL/PLATELET
Absolute Monocytes: 528 cells/uL (ref 200–950)
Basophils Absolute: 41 cells/uL (ref 0–200)
Basophils Relative: 0.7 %
Eosinophils Absolute: 81 cells/uL (ref 15–500)
Eosinophils Relative: 1.4 %
HCT: 42.7 % (ref 35.0–45.0)
Hemoglobin: 14.3 g/dL (ref 11.7–15.5)
Lymphs Abs: 1931 cells/uL (ref 850–3900)
MCH: 31.4 pg (ref 27.0–33.0)
MCHC: 33.5 g/dL (ref 32.0–36.0)
MCV: 93.6 fL (ref 80.0–100.0)
MPV: 10.9 fL (ref 7.5–12.5)
Monocytes Relative: 9.1 %
Neutro Abs: 3219 cells/uL (ref 1500–7800)
Neutrophils Relative %: 55.5 %
Platelets: 189 10*3/uL (ref 140–400)
RBC: 4.56 10*6/uL (ref 3.80–5.10)
RDW: 12.2 % (ref 11.0–15.0)
Total Lymphocyte: 33.3 %
WBC: 5.8 10*3/uL (ref 3.8–10.8)

## 2022-04-14 LAB — LIPID PANEL
Cholesterol: 171 mg/dL (ref <200)
HDL: 52 mg/dL (ref >=50)
LDL Cholesterol (Calc): 98 mg/dL (calc)
Non-HDL Cholesterol (Calc): 119 mg/dL (calc) (ref <130)
Total CHOL/HDL Ratio: 3.3 (calc) (ref <5.0)
Triglycerides: 109 mg/dL (ref <150)

## 2022-04-14 LAB — COMPLETE METABOLIC PANEL WITH GFR
AG Ratio: 1.3 (calc) (ref 1.0–2.5)
ALT: 11 U/L (ref 6–29)
AST: 16 U/L (ref 10–35)
Albumin: 4.1 g/dL (ref 3.6–5.1)
Alkaline phosphatase (APISO): 37 U/L (ref 37–153)
BUN: 19 mg/dL (ref 7–25)
CO2: 26 mmol/L (ref 20–32)
Calcium: 9.4 mg/dL (ref 8.6–10.4)
Chloride: 110 mmol/L (ref 98–110)
Creat: 0.78 mg/dL (ref 0.50–1.05)
Globulin: 3.1 g/dL (calc) (ref 1.9–3.7)
Glucose, Bld: 84 mg/dL (ref 65–99)
Potassium: 4.5 mmol/L (ref 3.5–5.3)
Sodium: 141 mmol/L (ref 135–146)
Total Bilirubin: 0.4 mg/dL (ref 0.2–1.2)
Total Protein: 7.2 g/dL (ref 6.1–8.1)
eGFR: 87 mL/min/{1.73_m2} (ref >=60)

## 2022-04-14 LAB — TSH: TSH: 1.43 mIU/L (ref 0.40–4.50)

## 2022-04-14 LAB — HEMOGLOBIN A1C
Hgb A1c MFr Bld: 5 % of total Hgb (ref <5.7)
Mean Plasma Glucose: 97 mg/dL
eAG (mmol/L): 5.4 mmol/L

## 2022-04-14 MED ORDER — CICLOPIROX OLAMINE 0.77 % EX CREA: TOPICAL_CREAM | Freq: Two times a day (BID) | CUTANEOUS | 0 refills | Status: DC

## 2022-04-14 MED ORDER — MELOXICAM 15 MG PO TABS: 15.0000 mg | ORAL_TABLET | Freq: Every day | ORAL | 3 refills | Status: DC

## 2022-04-14 NOTE — Patient Instructions (Addendum)
Cyclopirox twice daily to feet for Tinea pedis.  Labs are within normal limits. Take Meloxicam for musculoskeletal pain. Pneumococcal 20 vaccine given. Mammogram ordered.
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
## 2022-04-14 NOTE — Telephone Encounter (Signed)
[name] [phone]  [name] [name] called to see if you were going to call in something stronger than the meloxicam for her. She said she needed something that would work faster.

## 2022-04-15 NOTE — Telephone Encounter (Signed)
I called patient back to let her know this was all of the medication that Dr Renold Genta was going to call in. She verbalized understanding.

## 2022-04-18 ENCOUNTER — Encounter: Payer: Self-pay | Admitting: Internal Medicine

## 2022-05-20 ENCOUNTER — Telehealth: Payer: Self-pay

## 2022-05-20 ENCOUNTER — Other Ambulatory Visit: Payer: Self-pay

## 2022-05-20 NOTE — Telephone Encounter (Signed)
Called patient to inquire if mammogram had been scheduled. Patient has been given the number to call and schedule

## 2022-05-20 NOTE — Addendum Note (Signed)
Addended by: Angus Seller on: 05/20/2022 09:36 AM   Modules accepted: Orders

## 2022-05-25 ENCOUNTER — Other Ambulatory Visit: Payer: Self-pay

## 2022-05-25 ENCOUNTER — Other Ambulatory Visit: Payer: Self-pay | Admitting: Internal Medicine

## 2022-11-12 ENCOUNTER — Other Ambulatory Visit: Payer: Self-pay | Admitting: Internal Medicine

## 2022-11-21 IMAGING — DX DG CHEST 1V PORT
1 series · 1 of 1 positions shown · non-contrast
Comparison: 02/22/2019
COMPARISON: 02/22/2019

Addendum:
CLINICAL DATA: Altered mental status

EXAM:
PORTABLE CHEST 1 VIEW

[chest]
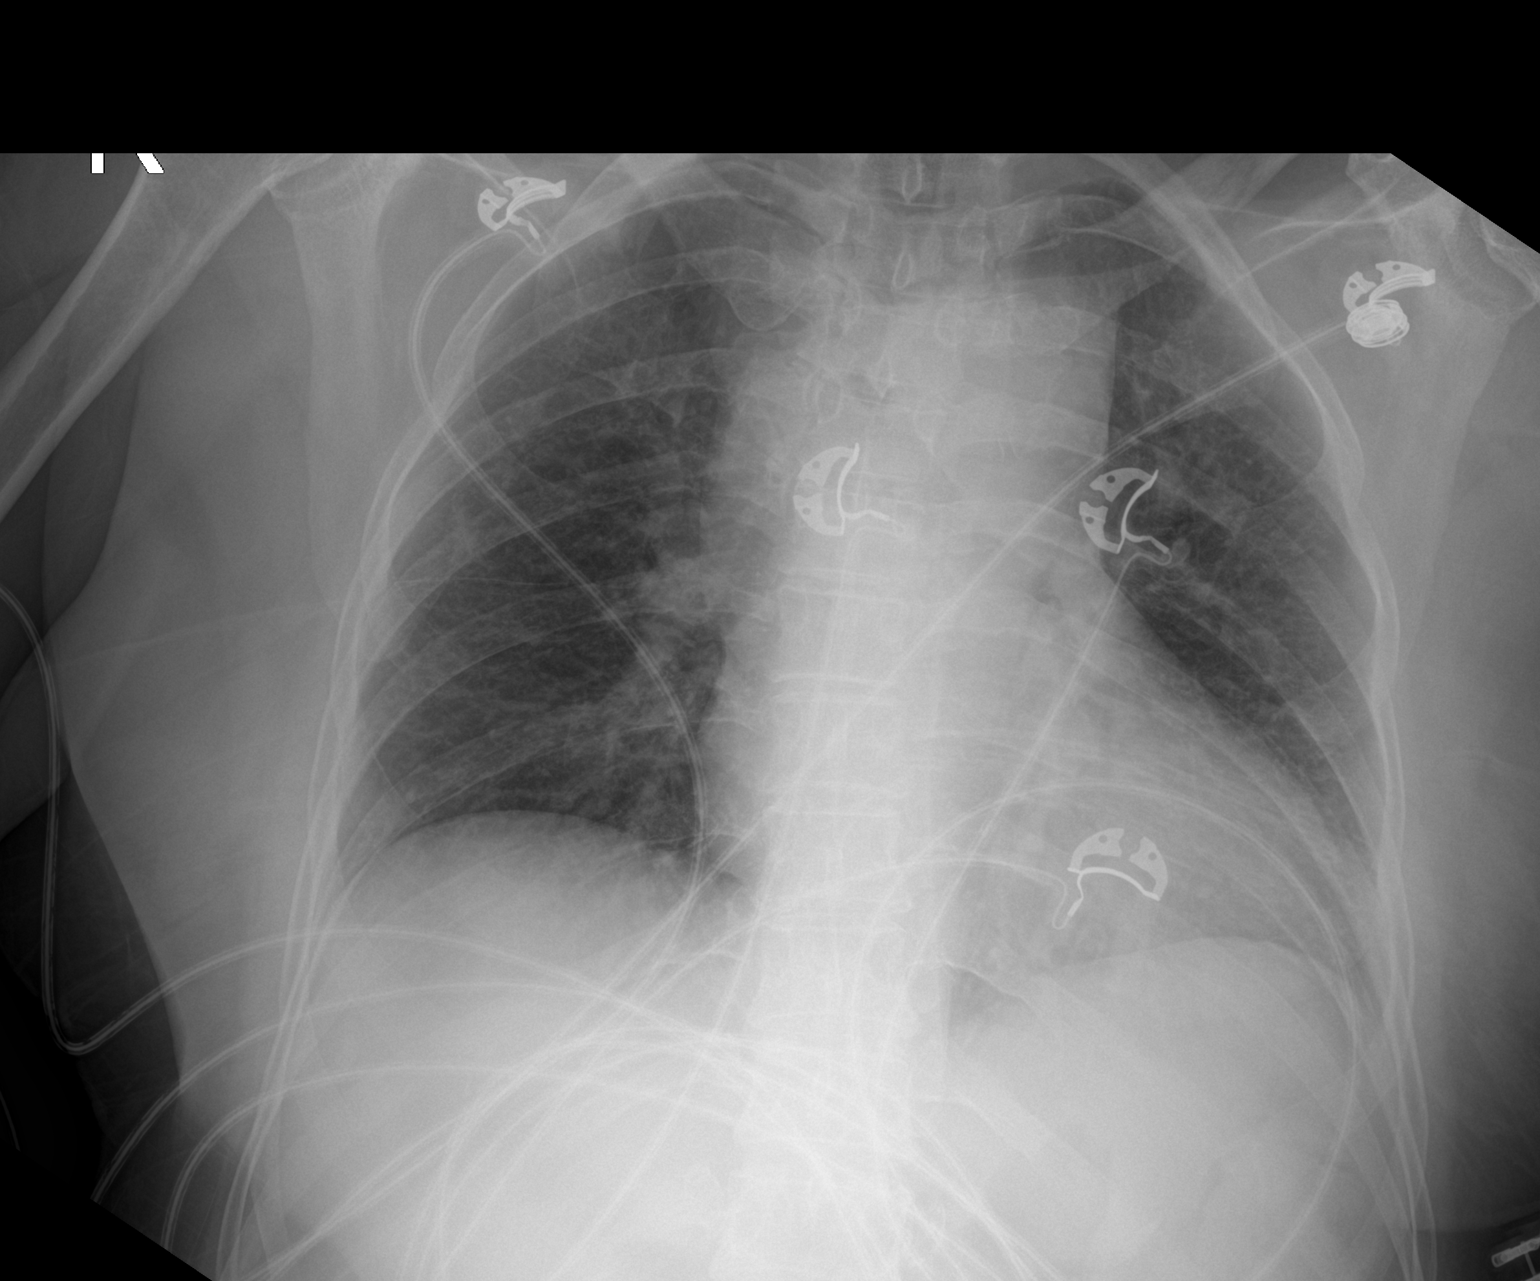

[1 of 1 positions shown; findings below may reference images not displayed]

FINDINGS: There is superior mediastinal widening, new from prior examination,
and the aortic knob is indistinct raising the question of vascular
injury and mediastinal hematoma. Less likely, this may represent
mediastinal adenopathy.

Cardiac size within normal limits. Lungs are clear. No pneumothorax
or pleural effusion.
IMPRESSION: Mediastinal widening. CT arteriography of the a chest is recommended
for further evaluation.

ADDENDUM:
These results were called by telephone at the time of interpretation
on 11/05/2020 at [DATE] to provider WENCHE KARIN NITTEBERG , who verbally
acknowledged these results.

*** End of Addendum ***
FINDINGS: There is superior mediastinal widening, new from prior examination,
and the aortic knob is indistinct raising the question of vascular
injury and mediastinal hematoma. Less likely, this may represent
mediastinal adenopathy.

Cardiac size within normal limits. Lungs are clear. No pneumothorax
or pleural effusion.
IMPRESSION: Mediastinal widening. CT arteriography of the a chest is recommended
for further evaluation.

## 2023-02-05 ENCOUNTER — Other Ambulatory Visit: Payer: Self-pay | Admitting: Internal Medicine

## 2023-09-03 ENCOUNTER — Ambulatory Visit (INDEPENDENT_AMBULATORY_CARE_PROVIDER_SITE_OTHER): Payer: No Typology Code available for payment source | Admitting: Internal Medicine

## 2023-09-03 ENCOUNTER — Encounter: Payer: Self-pay | Admitting: Internal Medicine

## 2023-09-03 ENCOUNTER — Other Ambulatory Visit: Payer: No Typology Code available for payment source

## 2023-09-03 VITALS — BP 150/90 | HR 62 | Ht 61.0 in | Wt 131.0 lb

## 2023-09-03 DIAGNOSIS — Z23 Encounter for immunization: Secondary | ICD-10-CM | POA: Diagnosis not present

## 2023-09-03 DIAGNOSIS — Z8669 Personal history of other diseases of the nervous system and sense organs: Secondary | ICD-10-CM

## 2023-09-03 DIAGNOSIS — E7800 Pure hypercholesterolemia, unspecified: Secondary | ICD-10-CM

## 2023-09-03 DIAGNOSIS — G43001 Migraine without aura, not intractable, with status migrainosus: Secondary | ICD-10-CM

## 2023-09-03 DIAGNOSIS — Z1329 Encounter for screening for other suspected endocrine disorder: Secondary | ICD-10-CM

## 2023-09-03 DIAGNOSIS — Z0000 Encounter for general adult medical examination without abnormal findings: Secondary | ICD-10-CM

## 2023-09-03 DIAGNOSIS — I728 Aneurysm of other specified arteries: Secondary | ICD-10-CM

## 2023-09-03 DIAGNOSIS — F411 Generalized anxiety disorder: Secondary | ICD-10-CM

## 2023-09-03 MED ORDER — ESTRADIOL 0.1 MG/24HR TD PTTW: MEDICATED_PATCH | TRANSDERMAL | 0 refills | Status: DC

## 2023-09-03 MED ORDER — TOPIRAMATE 200 MG PO TABS
200.0000 mg | ORAL_TABLET | Freq: Two times a day (BID) | ORAL | 0 refills | Status: DC
Start: 2023-09-03 — End: 2023-09-03

## 2023-09-03 MED ORDER — ROSUVASTATIN CALCIUM 5 MG PO TABS: 5.0000 mg | ORAL_TABLET | Freq: Every day | ORAL | 1 refills | Status: DC

## 2023-09-03 MED ORDER — TOPIRAMATE 200 MG PO TABS: 200.0000 mg | ORAL_TABLET | Freq: Two times a day (BID) | ORAL | 1 refills | Status: DC

## 2023-09-03 MED ORDER — HYDROCODONE-ACETAMINOPHEN 10-325 MG PO TABS: 1.0000 | ORAL_TABLET | Freq: Three times a day (TID) | ORAL | 0 refills | Status: AC | PRN

## 2023-09-03 NOTE — Progress Notes (Addendum)
Patient Care Team: [name], MD as PCP - General  Visit Date: 09/03/23  Subjective:    Patient ID: [name] [name] , Female   DOB: 02-Aug-1961, 62 y.o.    MRN: [id number]   [name] presents today for protracted migraine headache. She has a history of migraine headaches and she is out of her topiramate.  Past Medical History:  Diagnosis Date   Anxiety    Arthritis    Helicobacter pylori (H. pylori) 07/2010   Migraine      Family History  Adopted: Yes  Problem Relation Age of Onset   Hypertension Mother    Mental illness Mother    Hypertension Brother     Social Hx: Patient quit smoking around 2003.  Does not use alcohol or drugs.  Divorced.  1 son and 2 daughters.  Patient was raised in a foster home and in [place], [place].  She graduated from high school.  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  She resides alone.     Review of Systems  Constitutional:  Negative for fever and malaise/fatigue.  HENT:  Negative for congestion.   Eyes:  Negative for blurred vision.  Respiratory:  Negative for cough and shortness of breath.   Cardiovascular:  Negative for chest pain, palpitations and leg swelling.  Gastrointestinal:  Negative for vomiting.  Musculoskeletal:  Negative for back pain.  Skin:  Negative for rash.  Neurological:  Positive for headaches. Negative for loss of consciousness.        Objective:   Vitals: BP (!) 150/90   Pulse 62   Ht 5\' 1"  (1.549 m)   Wt 131 lb (59.4 kg)   LMP 11/02/2000   SpO2 97%   BMI 24.75 kg/m    Physical Exam Vitals and nursing note reviewed.  Constitutional:      General: She is not in acute distress.    Appearance: Normal appearance. She is not toxic-appearing.  HENT:     Head: Normocephalic and atraumatic.  Cardiovascular:     Rate and Rhythm: Normal rate and regular rhythm. No extrasystoles are present.    Pulses: Normal pulses.     Heart sounds: Normal heart sounds. No  murmur heard.    No friction rub. No gallop.  Pulmonary:     Effort: Pulmonary effort is normal. No respiratory distress.     Breath sounds: Normal breath sounds. No wheezing or rales.  Musculoskeletal:     Right lower leg: No edema.     Left lower leg: No edema.  Skin:    General: Skin is warm and dry.  Neurological:     General: No focal deficit present.     Mental Status: She is alert and oriented to person, place, and time. Mental status is at baseline.     Cranial Nerves: Cranial nerves 2-12 are intact.     Sensory: Sensation is intact.     Motor: Motor function is intact.  Psychiatric:        Mood and Affect: Mood normal.        Behavior: Behavior normal.        Thought Content: Thought content normal.        Judgment: Judgment normal.       Results:   Studies obtained and personally reviewed by me:   Labs:       Component Value Date/Time   NA 141 04/13/2022 1147   K 4.5 04/13/2022 1147  CL 110 04/13/2022 1147   CO2 26 04/13/2022 1147   GLUCOSE 84 04/13/2022 1147   BUN 19 04/13/2022 1147   CREATININE 0.78 04/13/2022 1147   CALCIUM 9.4 04/13/2022 1147   PROT 7.2 04/13/2022 1147   ALBUMIN 4.0 11/09/2020 0647   AST 16 04/13/2022 1147   ALT 11 04/13/2022 1147   ALKPHOS 45 11/09/2020 0647   BILITOT 0.4 04/13/2022 1147   GFRNONAA 75 02/21/2021 0931   GFRAA 87 02/21/2021 0931     Lab Results  Component Value Date   WBC 5.8 04/13/2022   HGB 14.3 04/13/2022   HCT 42.7 04/13/2022   MCV 93.6 04/13/2022   PLT 189 04/13/2022    Lab Results  Component Value Date   CHOL 171 04/13/2022   HDL 52 04/13/2022   LDLCALC 98 04/13/2022   TRIG 109 04/13/2022   CHOLHDL 3.3 04/13/2022    Lab Results  Component Value Date   HGBA1C 5.0 04/13/2022     Lab Results  Component Value Date   TSH 1.43 04/13/2022      Assessment & Plan:   Protracted migraine headache: advised to follow-up with Neurologist, Dr. Neale Burly.  Today, we have refilled topiramate.  Prescribed Norco 10-325 mg ( # 15 tablets) every 8 hours as needed.  Rest today and stay well-hydrated.  Also, refilled estradiol, rosuvastatin.   Vaccine counseling: Flu vaccine administered today.  Call if symptoms not improving within 24 hours or sooner if worse.  Labs drawn today include CBC with differential, c-Met and lipid panel.  I,Alexander Ruley,acting as a Neurosurgeon for [name], MD.,have documented all relevant documentation on the behalf of [name], MD,as directed by  [name], MD while in the presence of [name], MD.    I, [name], MD, have reviewed all documentation for this visit. The documentation on 09/03/23 for the exam, diagnosis, procedures, and orders are all accurate and complete.

## 2023-09-03 NOTE — Patient Instructions (Addendum)
Have prescribed Norco 10/325 (#15 tabs) prescribed for migraine headache.  Patient will follow up with Dr. Neale Burly, Neurologist soon. Labs drawn and are pending. Will send Dr. Neale Burly a copy of these labs. Flu vaccine given.

## 2023-09-04 LAB — CBC WITH DIFFERENTIAL/PLATELET
Absolute Lymphocytes: 1505 {cells}/uL (ref 850–3900)
Absolute Monocytes: 467 {cells}/uL (ref 200–950)
Basophils Absolute: 40 {cells}/uL (ref 0–200)
Basophils Relative: 0.7 %
Eosinophils Absolute: 103 {cells}/uL (ref 15–500)
Eosinophils Relative: 1.8 %
HCT: 42.4 % (ref 35.0–45.0)
Hemoglobin: 14 g/dL (ref 11.7–15.5)
MCH: 30.2 pg (ref 27.0–33.0)
MCHC: 33 g/dL (ref 32.0–36.0)
MCV: 91.4 fL (ref 80.0–100.0)
MPV: 10.4 fL (ref 7.5–12.5)
Monocytes Relative: 8.2 %
Neutro Abs: 3585 {cells}/uL (ref 1500–7800)
Neutrophils Relative %: 62.9 %
Platelets: 251 10*3/uL (ref 140–400)
RBC: 4.64 10*6/uL (ref 3.80–5.10)
RDW: 12.1 % (ref 11.0–15.0)
Total Lymphocyte: 26.4 %
WBC: 5.7 10*3/uL (ref 3.8–10.8)

## 2023-09-04 LAB — COMPLETE METABOLIC PANEL WITH GFR
AG Ratio: 1.1 (calc) (ref 1.0–2.5)
ALT: 13 U/L (ref 6–29)
AST: 17 U/L (ref 10–35)
Albumin: 4.1 g/dL (ref 3.6–5.1)
Alkaline phosphatase (APISO): 66 U/L (ref 37–153)
BUN: 11 mg/dL (ref 7–25)
CO2: 29 mmol/L (ref 20–32)
Calcium: 9.9 mg/dL (ref 8.6–10.4)
Chloride: 106 mmol/L (ref 98–110)
Creat: 0.62 mg/dL (ref 0.50–1.05)
Globulin: 3.8 g/dL — ABNORMAL HIGH (ref 1.9–3.7)
Glucose, Bld: 88 mg/dL (ref 65–99)
Potassium: 4.8 mmol/L (ref 3.5–5.3)
Sodium: 140 mmol/L (ref 135–146)
Total Bilirubin: 0.5 mg/dL (ref 0.2–1.2)
Total Protein: 7.9 g/dL (ref 6.1–8.1)
eGFR: 101 mL/min/{1.73_m2} (ref >=60)

## 2023-09-04 LAB — LIPID PANEL
Cholesterol: 216 mg/dL — ABNORMAL HIGH (ref <200)
HDL: 44 mg/dL — ABNORMAL LOW (ref >=50)
LDL Cholesterol (Calc): 144 mg/dL — ABNORMAL HIGH
Non-HDL Cholesterol (Calc): 172 mg/dL — ABNORMAL HIGH (ref <130)
Total CHOL/HDL Ratio: 4.9 (calc) (ref <5.0)
Triglycerides: 146 mg/dL (ref <150)

## 2023-09-04 LAB — TSH: TSH: 0.76 m[IU]/L (ref 0.40–4.50)

## 2023-09-06 ENCOUNTER — Ambulatory Visit (INDEPENDENT_AMBULATORY_CARE_PROVIDER_SITE_OTHER): Payer: No Typology Code available for payment source | Admitting: Internal Medicine

## 2023-09-06 VITALS — BP 130/90 | HR 74 | Resp 96 | Ht 61.0 in | Wt 133.0 lb

## 2023-09-06 DIAGNOSIS — G43001 Migraine without aura, not intractable, with status migrainosus: Secondary | ICD-10-CM

## 2023-09-06 MED ORDER — DICLOFENAC POTASSIUM 50 MG PO TABS: 50.0000 mg | ORAL_TABLET | Freq: Two times a day (BID) | ORAL | 2 refills | Status: DC

## 2023-09-06 NOTE — Progress Notes (Addendum)
Patient Care Team: [name], MD as PCP - General  Visit Date: 09/06/23  Subjective:    Patient ID: [name] [name] , Female   DOB: 12/14/1960, 62 y.o.    MRN: [id number]   [name] presents today for persistent and protracted migraine headache. No improvement since her last visit here on 09/03/23.  At that time we refilled her topiramate and prescribed Norco 10/325 ( # 15 tablets) to take sparingly every 8 hours as needed.  Was advised to rest and stay well-hydrated.  CBC with differential and c-Met were essentially normal.She does have pure hypercholesterolemia.  See extensive note from 09/03/2023 taking topiramate, Norco without relief. She is taking phenergan at bedtime for nausea but is still nauseated during the daytime.  Past Medical History:  Diagnosis Date   Anxiety    Arthritis    Helicobacter pylori (H. pylori) 07/2010   Migraine      Family History  Adopted: Yes  Problem Relation Age of Onset   Hypertension Mother    Mental illness Mother    Hypertension Brother     Social History   Social History Narrative   Pt went into the foster system at the age of 60. She as in 4 really bad foster homes. She was raised in [place]. Pt has one brother. Pt graduated HS. Pt lives in [place] and has one son and 2 daughters. Divorced x3. 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       Review of Systems  Constitutional:  Negative for fever and malaise/fatigue.  HENT:  Negative for congestion.   Eyes:  Negative for blurred vision.  Respiratory:  Negative for cough and shortness of breath.   Cardiovascular:  Negative for chest pain, palpitations and leg swelling.  Gastrointestinal:  Positive for nausea. Negative for vomiting.  Musculoskeletal:  Negative for back pain.  Skin:  Negative for rash.  Neurological:  Positive for headaches. Negative for loss of consciousness.        Objective:   Vitals: BP (!) 130/90   Pulse 74   Resp (!) 96    Ht 5\' 1"  (1.549 m)   Wt 133 lb (60.3 kg)   LMP 11/02/2000   BMI 25.13 kg/m    Physical Exam Vitals and nursing note reviewed.  Constitutional:      General: She is not in acute distress.    Appearance: Normal appearance. She is not toxic-appearing.  HENT:     Head: Normocephalic and atraumatic.  Pulmonary:     Effort: Pulmonary effort is normal.  Skin:    General: Skin is warm and dry.  Neurological:     Mental Status: She is alert and oriented to person, place, and time. Mental status is at baseline.     Cranial Nerves: Cranial nerves 2-12 are intact.     Sensory: Sensation is intact.     Motor: Motor function is intact.  Psychiatric:        Mood and Affect: Mood normal.        Behavior: Behavior normal.        Thought Content: Thought content normal.        Judgment: Judgment normal.       Results:   Studies obtained and personally reviewed by me:   Labs:       Component Value Date/Time   NA 140 09/03/2023 1008   K 4.8 09/03/2023 1008   CL 106 09/03/2023 1008  CO2 29 09/03/2023 1008   GLUCOSE 88 09/03/2023 1008   BUN 11 09/03/2023 1008   CREATININE 0.62 09/03/2023 1008   CALCIUM 9.9 09/03/2023 1008   PROT 7.9 09/03/2023 1008   ALBUMIN 4.0 11/09/2020 0647   AST 17 09/03/2023 1008   ALT 13 09/03/2023 1008   ALKPHOS 45 11/09/2020 0647   BILITOT 0.5 09/03/2023 1008   GFRNONAA 75 02/21/2021 0931   GFRAA 87 02/21/2021 0931     Lab Results  Component Value Date   WBC 5.7 09/03/2023   HGB 14.0 09/03/2023   HCT 42.4 09/03/2023   MCV 91.4 09/03/2023   PLT 251 09/03/2023    Lab Results  Component Value Date   CHOL 216 (H) 09/03/2023   HDL 44 (L) 09/03/2023   LDLCALC 144 (H) 09/03/2023   TRIG 146 09/03/2023   CHOLHDL 4.9 09/03/2023    Lab Results  Component Value Date   HGBA1C 5.0 04/13/2022     Lab Results  Component Value Date   TSH 0.76 09/03/2023      Assessment & Plan:   Protracted migraine headache: prescribed diclofenac 50 mg  twice daily. She says this has worked well for her before as prescribed by her Neurologist.  Dr. Santiago Glad is her Neurologist but he is out of town today.  Call back if not improving in 24 hours or sooner if worse.    I,Alexander Ruley,acting as a Neurosurgeon for [name], MD.,have documented all relevant documentation on the behalf of [name], MD,as directed by  [name], MD while in the presence of [name], MD.   I, [name], MD, have reviewed all documentation for this visit. The documentation on 09/18/23 for the exam, diagnosis, procedures, and orders are all accurate and complete.

## 2023-09-07 ENCOUNTER — Ambulatory Visit: Payer: Self-pay | Admitting: Internal Medicine

## 2023-09-09 ENCOUNTER — Encounter: Payer: Self-pay | Admitting: Internal Medicine

## 2023-09-10 ENCOUNTER — Emergency Department (HOSPITAL_BASED_OUTPATIENT_CLINIC_OR_DEPARTMENT_OTHER): Payer: No Typology Code available for payment source | Admitting: Radiology

## 2023-09-10 ENCOUNTER — Emergency Department (HOSPITAL_BASED_OUTPATIENT_CLINIC_OR_DEPARTMENT_OTHER)
Admission: EM | Admit: 2023-09-10 | Discharge: 2023-09-11 | Disposition: A | Discharge status: Home / Self Care | Payer: No Typology Code available for payment source

## 2023-09-10 ENCOUNTER — Encounter (HOSPITAL_BASED_OUTPATIENT_CLINIC_OR_DEPARTMENT_OTHER): Payer: Self-pay

## 2023-09-10 ENCOUNTER — Other Ambulatory Visit: Payer: Self-pay

## 2023-09-10 DIAGNOSIS — R519 Headache, unspecified: Secondary | ICD-10-CM | POA: Insufficient documentation

## 2023-09-10 DIAGNOSIS — R079 Chest pain, unspecified: Secondary | ICD-10-CM | POA: Diagnosis present

## 2023-09-10 DIAGNOSIS — R0789 Other chest pain: Secondary | ICD-10-CM | POA: Insufficient documentation

## 2023-09-10 DIAGNOSIS — R42 Dizziness and giddiness: Secondary | ICD-10-CM | POA: Diagnosis not present

## 2023-09-10 LAB — BASIC METABOLIC PANEL
Anion gap: 9 (ref 5–15)
BUN: 14 mg/dL (ref 8–23)
CO2: 22 mmol/L (ref 22–32)
Calcium: 9.6 mg/dL (ref 8.9–10.3)
Chloride: 108 mmol/L (ref 98–111)
Creatinine, Ser: 0.84 mg/dL (ref 0.44–1.00)
GFR, Estimated: >60 mL/min (ref >60)
Glucose, Bld: 81 mg/dL (ref 70–99)
Potassium: 3.6 mmol/L (ref 3.5–5.1)
Sodium: 139 mmol/L (ref 135–145)

## 2023-09-10 LAB — CBC
HCT: 36.1 % (ref 36.0–46.0)
Hemoglobin: 12.6 g/dL (ref 12.0–15.0)
MCH: 30.7 pg (ref 26.0–34.0)
MCHC: 34.9 g/dL (ref 30.0–36.0)
MCV: 88 fL (ref 80.0–100.0)
Platelets: 220 10*3/uL (ref 150–400)
RBC: 4.1 MIL/uL (ref 3.87–5.11)
RDW: 13.1 % (ref 11.5–15.5)
WBC: 6.8 10*3/uL (ref 4.0–10.5)
nRBC: 0 % (ref 0.0–0.2)

## 2023-09-10 LAB — TROPONIN I (HIGH SENSITIVITY)
Troponin I (High Sensitivity): 3 ng/L (ref <18)
Troponin I (High Sensitivity): 4 ng/L (ref <18)

## 2023-09-10 MED ORDER — MAGNESIUM SULFATE IN D5W 1-5 GM/100ML-% IV SOLN
1.0000 g | Freq: Once | INTRAVENOUS | Status: AC
  Administered 2023-09-10: 1 g via INTRAVENOUS
  Filled 2023-09-10: qty 100

## 2023-09-10 MED ORDER — MAGNESIUM SULFATE 50 % IJ SOLN: 1.0000 g | Freq: Once | INTRAMUSCULAR | Status: DC

## 2023-09-10 MED ORDER — MECLIZINE HCL 25 MG PO TABS: 25.0000 mg | ORAL_TABLET | Freq: Three times a day (TID) | ORAL | 0 refills | Status: AC | PRN

## 2023-09-10 MED ORDER — KETOROLAC TROMETHAMINE 30 MG/ML IJ SOLN
30.0000 mg | Freq: Once | INTRAMUSCULAR | Status: AC
  Administered 2023-09-10: 30 mg via INTRAVENOUS
  Filled 2023-09-10: qty 1

## 2023-09-10 MED ORDER — SODIUM CHLORIDE 0.9 % IV SOLN
25.0000 mg | Freq: Once | INTRAVENOUS | Status: AC
  Administered 2023-09-10: 25 mg via INTRAVENOUS
  Filled 2023-09-10: qty 1

## 2023-09-10 MED ORDER — PROMETHAZINE HCL 25 MG/ML IJ SOLN
INTRAMUSCULAR | Status: AC
  Filled 2023-09-10: qty 1

## 2023-09-10 MED ORDER — AMLODIPINE BESYLATE 5 MG PO TABS: 5.0000 mg | ORAL_TABLET | Freq: Every day | ORAL | 0 refills | Status: DC

## 2023-09-10 MED ORDER — PANTOPRAZOLE SODIUM 40 MG IV SOLR
40.0000 mg | Freq: Once | INTRAVENOUS | Status: AC
  Administered 2023-09-10: 40 mg via INTRAVENOUS
  Filled 2023-09-10: qty 10

## 2023-09-10 MED ORDER — ALUM & MAG HYDROXIDE-SIMETH 200-200-20 MG/5ML PO SUSP
30.0000 mL | Freq: Once | ORAL | Status: AC
  Administered 2023-09-10: 30 mL via ORAL
  Filled 2023-09-10: qty 30

## 2023-09-10 MED ORDER — SODIUM CHLORIDE 0.9 % IV BOLUS
1000.0000 mL | Freq: Once | INTRAVENOUS | Status: AC
  Administered 2023-09-10: 1000 mL via INTRAVENOUS

## 2023-09-10 MED ORDER — MECLIZINE HCL 25 MG PO TABS
25.0000 mg | ORAL_TABLET | Freq: Once | ORAL | Status: AC
  Administered 2023-09-10: 25 mg via ORAL
  Filled 2023-09-10: qty 1

## 2023-09-10 MED ORDER — PANTOPRAZOLE SODIUM 20 MG PO TBEC: 20.0000 mg | DELAYED_RELEASE_TABLET | Freq: Every day | ORAL | 0 refills | Status: DC

## 2023-09-10 NOTE — ED Provider Notes (Signed)
[place] EMERGENCY DEPARTMENT AT [facility] Provider Note   CSN: [id number] Arrival date & time: 09/10/23  1449     History  Chief Complaint  Patient presents with   Chest Pain   Dizziness    [name] [name] is a 62 y.o. female with a history of migraines, anxiety, and arthritis who presents the ED today for chest pain.  Patient reports an intermittent burning sensation in the center of her chest as well as increased burping for the past week.  She also been having pain behind her eyes, without vision changes, and positional vertigo.  She was evaluated by her primary care provider twice who told her it was related to migraines.  Patient does not think it is.  She also states that has been having elevated blood pressure readings and called her primary care about that today and she would not start her on blood pressure medication without her coming in so she decided come here for further evaluation.  Denies shortness of breath, fever, back pain, or abdominal pain.  No additional complaints or concerns at this time.    Home Medications Prior to Admission medications   Medication Sig Start Date End Date Taking? Authorizing Provider  meclizine (ANTIVERT) 25 MG tablet Take 1 tablet (25 mg total) by mouth 3 (three) times daily as needed for dizziness. 09/10/23 10/10/23 Yes Maxwell Marion, PA-C  pantoprazole (PROTONIX) 20 MG tablet Take 1 tablet (20 mg total) by mouth daily. 09/10/23 10/10/23 Yes Maxwell Marion, PA-C  amLODipine (NORVASC) 5 MG tablet Take 1 tablet (5 mg total) by mouth daily. 09/10/23   [name], MD  ciclopirox (LOPROX) 0.77 % cream Apply topically 2 (two) times daily. 04/14/22   [name], MD  diclofenac (CATAFLAM) 50 MG tablet Take 1 tablet (50 mg total) by mouth 2 (two) times daily. 09/06/23   [name], MD  estradiol (VIVELLE-DOT) 0.1 MG/24HR patch APPLY 1 PATCH TOPICALLY TO THE SKIN 2 TIMES A WEEK 09/03/23   [name], MD  Multiple Vitamin  (MULTIVITAMIN ADULT PO) Take by mouth.    [provider]  rosuvastatin (CRESTOR) 5 MG tablet Take 1 tablet (5 mg total) by mouth daily. 09/03/23   [name], MD  topiramate (TOPAMAX) 200 MG tablet Take 1 tablet (200 mg total) by mouth 2 (two) times daily. 09/03/23   [name], MD      Allergies    Patient has no known allergies.    Review of Systems   Review of Systems  Cardiovascular:  Positive for chest pain.  Neurological:  Positive for dizziness.  All other systems reviewed and are negative.   Physical Exam Updated Vital Signs BP (!) 170/90 (BP Location: Right Arm)   Pulse 66   Temp 98.5 F (36.9 C) (Oral)   Resp (!) 22   LMP 11/02/2000   SpO2 99%  Physical Exam Vitals and nursing note reviewed.  Constitutional:      General: She is not in acute distress.    Appearance: Normal appearance.  HENT:     Head: Normocephalic and atraumatic.     Mouth/Throat:     Mouth: Mucous membranes are moist.  Eyes:     Extraocular Movements: Extraocular movements intact.     Conjunctiva/sclera: Conjunctivae normal.     Pupils: Pupils are equal, round, and reactive to light.     Comments: Nystagmus noted with EOMs  Cardiovascular:     Rate and Rhythm: Normal  rate and regular rhythm.     Pulses: Normal pulses.     Heart sounds: Normal heart sounds.  Pulmonary:     Effort: Pulmonary effort is normal.     Breath sounds: Normal breath sounds.  Abdominal:     Palpations: Abdomen is soft.     Tenderness: There is no abdominal tenderness.  Musculoskeletal:        General: Normal range of motion.     Cervical back: Normal range of motion.     Right lower leg: No edema.     Left lower leg: No edema.  Skin:    General: Skin is warm and dry.     Findings: No rash.  Neurological:     General: No focal deficit present.     Mental Status: She is alert.     Sensory: No sensory deficit.     Motor: No weakness.     Gait: Gait normal.  Psychiatric:        Mood and  Affect: Mood normal.        Behavior: Behavior normal.    ED Results / Procedures / Treatments   Labs (all labs ordered are listed, but only abnormal results are displayed) Labs Reviewed  BASIC METABOLIC PANEL  CBC  TROPONIN I (HIGH SENSITIVITY)  TROPONIN I (HIGH SENSITIVITY)    EKG EKG Interpretation Date/Time:  Friday September 10 2023 15:00:04 EST Ventricular Rate:  75 PR Interval:  148 QRS Duration:  90 QT Interval:  378 QTC Calculation: 422 R Axis:   57  Text Interpretation: Normal sinus rhythm Nonspecific ST and T wave abnormality Abnormal ECG When compared with ECG of 04-Nov-2020 18:22, PREVIOUS ECG IS PRESENT Confirmed by Estanislado Pandy [phone]) on 09/10/2023 3:06:12 PM  Radiology DG Chest 2 View  Result Date: 09/10/2023 CLINICAL DATA:  Chest pain. EXAM: CHEST - 2 VIEW COMPARISON:  Radiograph and CT 11/05/2020 FINDINGS: The cardiomediastinal contours are normal. The operative right subclavian artery in prior CT has no correlate on this exam. Pulmonary vasculature is normal. No consolidation, pleural effusion, or pneumothorax. No acute osseous abnormalities are seen. Telemetry leads overlie the chest. IMPRESSION: No active cardiopulmonary disease. Electronically Signed   By: Narda Rutherford M.D.   On: 09/10/2023 17:44    Procedures Procedures: not indicated.   Medications Ordered in ED Medications  sodium chloride 0.9 % bolus 1,000 mL (has no administration in time range)  ketorolac (TORADOL) 30 MG/ML injection 30 mg (has no administration in time range)  promethazine (PHENERGAN) 25 mg in sodium chloride 0.9 % 50 mL IVPB (has no administration in time range)  pantoprazole (PROTONIX) injection 40 mg (has no administration in time range)  magnesium sulfate IVPB 1 g 100 mL (has no administration in time range)  alum & mag hydroxide-simeth (MAALOX/MYLANTA) 200-200-20 MG/5ML suspension 30 mL (30 mLs Oral Given 09/10/23 2049)  meclizine (ANTIVERT) tablet 25 mg (25 mg Oral  Given 09/10/23 2049)    ED Course/ Medical Decision Making/ A&P                                 Medical Decision Making Amount and/or Complexity of Data Reviewed Labs: ordered. Radiology: ordered.  Risk OTC drugs. Prescription drug management.   This patient presents to the ED for concern of chest pain, this involves an extensive number of treatment options, and is a complaint that carries with it a high risk of complications and morbidity.  Differential diagnosis includes: ACS, pneumonia, pneumothorax, costochondritis, pleurisy, muscle strain, GERD, etc.   Comorbidities  See HPI above   Additional History  Additional history obtained from prior records.   Cardiac Monitoring / EKG  The patient was maintained on a cardiac monitor.  I personally viewed and interpreted the cardiac monitored which showed: NSR with a heart rate of 75 bpm.   Lab Tests  I ordered and personally interpreted labs.  The pertinent results include:   BMP and CBC are unremarkable - no acute electrolyte derangement, AKI, anemia, or infection Negative troponin x 2   Imaging Studies  I ordered imaging studies including CXR  I independently visualized and interpreted imaging which showed: no acute cardiopulmonary findings. I agree with the radiologist interpretation    Problem List / ED Course / Critical Interventions / Medication Management  Chest pain, vertigo, and headache I ordered medications including: Maalox for burning chest pain Meclizine for vertigo  Reevaluation of the patient after these medicines showed that the patient improved I have reviewed the patients home medicines and have made adjustments as needed On reevaluation patient reports headache behind her eyes.  I put in orders for headache cocktail prior to discharge.   Social Determinants of Health  Access to healthcare   Test / Admission - Considered  Discussed findings with patient.  Advised that the burning  chest pain is most likely due to acid reflux.  Prescription for pantoprazole sent to the pharmacy. She is hemodynamically stable and safe for discharge home. Return precautions provided.        Final Clinical Impression(s) / ED Diagnoses Final diagnoses:  Atypical chest pain  Acute nonintractable headache, unspecified headache type  Vertigo    Rx / DC Orders ED Discharge Orders          Ordered    pantoprazole (PROTONIX) 20 MG tablet  Daily        09/10/23 2202    meclizine (ANTIVERT) 25 MG tablet  3 times daily PRN        09/10/23 2202    Ambulatory referral to [facility]        09/10/23 2203              Maxwell Marion, PA-C 09/10/23 2334    Coral Spikes, DO 09/11/23 0002

## 2023-09-10 NOTE — Telephone Encounter (Signed)
[name] [name] is calling insisting on getting blood pressure medication sent in for her and not coming in for an appointment because she has no availability today.

## 2023-09-10 NOTE — Telephone Encounter (Signed)
Called patient to let her know Dr Lenord Fellers sent in BP medication and she is to take it as prescribed and to take BP twice a day and come in next Friday to be seen. She scheduled an appointment and verbalized understanding.

## 2023-09-10 NOTE — Discharge Instructions (Signed)
Your labs and imaging have been reassuring today.  Take pantoprazole once a day for the next 14 days to see if this helps with the burning sensation in your chest.  I have sent a prescription for meclizine as well to take this as needed for dizziness.

## 2023-09-10 NOTE — ED Triage Notes (Signed)
Pt c/o HTN since last Thursday, was noticing tightness in head & got high readings on home cuff; seen for same twice at PCP & advised it was migraines- pt reports hx of migraines "but this was different." Reports that today, tightness in chest, dizziness, tongue is tingling, weak/ lightheaded. Indigestion feeling approx 1hr ago.

## 2023-09-10 NOTE — Telephone Encounter (Signed)
We are sending in Norvasc 5 mg daily #30 with no refill with follow up in one week.

## 2023-09-10 NOTE — Progress Notes (Shared)
    Patient Care Team: [name], MD as PCP - General  Visit Date: 09/10/23  Subjective:    Patient ID: [name] [name] , Female   DOB: [birth year], 62 y.o.    MRN: [id number]   [name] presents today for hospital/ED follow-up.    Past Medical History:  Diagnosis Date   Anxiety    Arthritis    Helicobacter pylori (H. pylori) 07/2010   Migraine      Family History  Adopted: Yes  Problem Relation Age of Onset   Hypertension Mother    Mental illness Mother    Hypertension Brother     Social History   Social History Narrative   Pt went into the foster system at the age of 44. She as in 4 really bad foster homes. She was raised in [place]. Pt has one brother. Pt graduated HS. Pt lives in [place] and has one son and 2 daughters. Divorced x3. 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       ROS      Objective:   Vitals: LMP 11/02/2000    Physical Exam    Results:   Studies obtained and personally reviewed by me:  Imaging, colonoscopy, mammogram, bone density scan, echocardiogram, heart cath, stress test, CT calcium score, etc. ***   Labs:       Component Value Date/Time   NA 139 09/10/2023 1502   K 3.6 09/10/2023 1502   CL 108 09/10/2023 1502   CO2 22 09/10/2023 1502   GLUCOSE 81 09/10/2023 1502   BUN 14 09/10/2023 1502   CREATININE 0.84 09/10/2023 1502   CREATININE 0.62 09/03/2023 1008   CALCIUM 9.6 09/10/2023 1502   PROT 7.9 09/03/2023 1008   ALBUMIN 4.0 11/09/2020 0647   AST 17 09/03/2023 1008   ALT 13 09/03/2023 1008   ALKPHOS 45 11/09/2020 0647   BILITOT 0.5 09/03/2023 1008   GFRNONAA >60 09/10/2023 1502   GFRNONAA 75 02/21/2021 0931   GFRAA 87 02/21/2021 0931     Lab Results  Component Value Date   WBC 6.8 09/10/2023   HGB 12.6 09/10/2023   HCT 36.1 09/10/2023   MCV 88.0 09/10/2023   PLT 220 09/10/2023    Lab Results  Component Value Date   CHOL 216 (H) 09/03/2023   HDL 44 (L) 09/03/2023    LDLCALC 144 (H) 09/03/2023   TRIG 146 09/03/2023   CHOLHDL 4.9 09/03/2023    Lab Results  Component Value Date   HGBA1C 5.0 04/13/2022     Lab Results  Component Value Date   TSH 0.76 09/03/2023     No results found for: "PSA1", "PSA" *** delete for female pts  ***    Assessment & Plan:   ***    I,Alexander Ruley,acting as a scribe for [name], MD.,have documented all relevant documentation on the behalf of [name], MD,as directed by  [name], MD while in the presence of [name], MD.   ***

## 2023-09-11 NOTE — ED Notes (Signed)
Reviewed AVS with patient, patient expressed understanding of directions, denies further questions at this time. 

## 2023-09-13 ENCOUNTER — Telehealth: Payer: Self-pay | Admitting: Internal Medicine

## 2023-09-13 ENCOUNTER — Encounter: Payer: Self-pay | Admitting: Internal Medicine

## 2023-09-13 NOTE — Telephone Encounter (Signed)
Called patient this morning to follow up ED visit last week. She is feeling better. She is taking amlodipine for BP that we sent in last week she says. She would like to cancel OV for later this week as she feels she needs to work. Vertigo has improved. Says BP is 127/84 this morning. MJB, MD

## 2023-09-17 ENCOUNTER — Ambulatory Visit: Payer: No Typology Code available for payment source | Admitting: Internal Medicine

## 2023-09-18 ENCOUNTER — Encounter: Payer: Self-pay | Admitting: Internal Medicine

## 2023-09-18 NOTE — Patient Instructions (Signed)
Try taking diclofenac 50 mg twice daily.  Patient says this is worked well previously and was prescribed by Dr. Neale Burly, her neurologist who is not available today.  Call back if not improving in 24 hours or sooner if worse.

## 2023-10-03 ENCOUNTER — Other Ambulatory Visit: Payer: Self-pay | Admitting: Internal Medicine

## 2023-10-19 NOTE — Progress Notes (Shared)
    Patient Care Team: [name], MD as PCP - General  Visit Date: 10/19/23  Subjective:    Patient ID: [name] [name] , Female   DOB: 12/08/1960, 62 y.o.    MRN: [id number]   63 y.o. Female presents today for dysphagia, swelling anterior neck.   Past Medical History:  Diagnosis Date   Anxiety    Arthritis    Helicobacter pylori (H. pylori) 07/2010   Migraine      Family History  Adopted: Yes  Problem Relation Age of Onset   Hypertension Mother    Mental illness Mother    Hypertension Brother     Social History   Social History Narrative   Pt went into the foster system at the age of 54. She as in 4 really bad foster homes. She was raised in [place]. Pt has one brother. Pt graduated HS. Pt lives in [place] and has one son and 2 daughters. Divorced x3. 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       ROS      Objective:   Vitals: LMP 11/02/2000    Physical Exam    Results:   Studies obtained and personally reviewed by me:  Imaging, colonoscopy, mammogram, bone density scan, echocardiogram, heart cath, stress test, CT calcium score, etc. ***   Labs:       Component Value Date/Time   NA 139 09/10/2023 1502   K 3.6 09/10/2023 1502   CL 108 09/10/2023 1502   CO2 22 09/10/2023 1502   GLUCOSE 81 09/10/2023 1502   BUN 14 09/10/2023 1502   CREATININE 0.84 09/10/2023 1502   CREATININE 0.62 09/03/2023 1008   CALCIUM 9.6 09/10/2023 1502   PROT 7.9 09/03/2023 1008   ALBUMIN 4.0 11/09/2020 0647   AST 17 09/03/2023 1008   ALT 13 09/03/2023 1008   ALKPHOS 45 11/09/2020 0647   BILITOT 0.5 09/03/2023 1008   GFRNONAA >60 09/10/2023 1502   GFRNONAA 75 02/21/2021 0931   GFRAA 87 02/21/2021 0931     Lab Results  Component Value Date   WBC 6.8 09/10/2023   HGB 12.6 09/10/2023   HCT 36.1 09/10/2023   MCV 88.0 09/10/2023   PLT 220 09/10/2023    Lab Results  Component Value Date   CHOL 216 (H) 09/03/2023   HDL 44 (L)  09/03/2023   LDLCALC 144 (H) 09/03/2023   TRIG 146 09/03/2023   CHOLHDL 4.9 09/03/2023    Lab Results  Component Value Date   HGBA1C 5.0 04/13/2022     Lab Results  Component Value Date   TSH 0.76 09/03/2023     No results found for: "PSA1", "PSA" *** delete for female pts  ***    Assessment & Plan:   ***    I,Alexander Ruley,acting as a scribe for [name], MD.,have documented all relevant documentation on the behalf of [name], MD,as directed by  [name], MD while in the presence of [name], MD.   ***

## 2023-10-21 ENCOUNTER — Ambulatory Visit: Payer: No Typology Code available for payment source | Admitting: Internal Medicine

## 2023-10-24 ENCOUNTER — Other Ambulatory Visit: Payer: Self-pay | Admitting: Internal Medicine

## 2023-11-02 NOTE — Progress Notes (Signed)
 Patient Care Team: Perri [name] PARAS, MD as PCP - General  Visit Date: 11/04/23  Subjective:  Patient PI:[name] [name], [place] DOB: Jan 04, 1961,62 y.o. MRN:[id number]   [name] presents today for acute visit for sore throat   Was seen 10/08/23 at [facility] for sore throat. Was prescribed 500 mg Amoxicillin  TID x7 days, which she reports didn't relieve her symptoms, and was recommended to use Flonase and Zyrtec. Rapid Strep NEG; Rapid COVID NEG.    Reports that she has had a swollen knot in her throat for the past month. Hasn't used any nasal sprays. Endorses sore throat and pain on palpitation of cervical node, dysphagia, no appetite, constant chill, expectoration of clear mucus, and left-sided epistaxis. She states her worst symptom has been dysphagia. Denies known sick contacts, ear pain, fever, or shaking.    US  Thyroid  11/05/23 13:00: Mildly enlarged and diffusely heterogeneous thyroid  gland. Multiple areas of nodularity were measured on the exam but these are not discrete nodules by imaging and blend into one another. It is felt that these are all likely areas of pseudo nodularity within a heterogeneous gland. The appearance of the thyroid  gland is likely consistent with diffuse goiter with probable areas of focal thyroiditis. Correlation suggested with thyroid  function tests. Consider follow-up ultrasound in 6-12 months. Past Medical History:  Diagnosis Date   Anxiety    Arthritis    Helicobacter pylori (H. pylori) 07/2010   Migraine     Family History  Adopted: Yes  Problem Relation Age of Onset   Hypertension Mother    Mental illness Mother    Hypertension Brother    Social History   Social History Narrative   Pt went into the foster system at the age of 46. She as in 4 really bad foster homes. She was raised in [place]. Pt has one brother. Pt graduated HS. Pt lives in [place] and has one son and 2 daughters. Divorced x3. 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    Review of Systems  Constitutional:  Positive for chills (constant). Negative for fever and malaise/fatigue.  HENT:  Positive for nosebleeds (left-sided) and sore throat. Negative for congestion and ear pain.   Eyes:  Negative for blurred vision.  Respiratory:  Positive for sputum production (clear). Negative for cough and shortness of breath.   Cardiovascular:  Negative for chest pain, palpitations and leg swelling.  Gastrointestinal:  Negative for vomiting.       (+) No Appetite   Musculoskeletal:  Negative for back pain.  Skin:  Negative for rash.  Neurological:  Negative for loss of consciousness and headaches.     Objective:  Vitals: BP 130/88   Pulse (!) 57   Temp 98 F (36.7 C)   Ht 5' 1 (1.549 m)   Wt 127 lb (57.6 kg)   LMP 11/02/2000   SpO2 97%   BMI 24.00 kg/m   Physical Exam Vitals and nursing note reviewed.  Constitutional:      General: She is not in acute distress.    Appearance: Normal appearance. She is not toxic-appearing.  HENT:     Head: Normocephalic and atraumatic.     Comments: Small anterior cervical node bilaterally    Right Ear: Ear canal and external ear normal. Tympanic membrane is erythematous (slightly).     Left Ear: Tympanic membrane is erythematous (pink).     Ears:     Comments: Right Ear: normal light reflex Left Ear:  Full     Nose: Mucosal edema (left-sided) present.     Mouth/Throat:     Pharynx: Oropharynx is clear.  Pulmonary:     Effort: Pulmonary effort is normal.  Skin:    General: Skin is warm and dry.  Neurological:     Mental Status: She is alert and oriented to person, place, and time. Mental status is at baseline.  Psychiatric:        Mood and Affect: Mood normal.        Behavior: Behavior normal.        Thought Content: Thought content normal.        Judgment: Judgment normal.    Results:  Studies obtained and personally reviewed by me:  US  Thyroid  11/06/23 13:00: Mildly enlarged and diffusely  heterogeneous thyroid  gland. Multiple areas of nodularity were measured on the exam but these are not discrete nodules by imaging and blend into one another. It is felt that these are all likely areas of pseudo nodularity within a heterogeneous gland. The appearance of the thyroid  gland is likely consistent with diffuse goiter with probable areas of focal thyroiditis. Correlation suggested with thyroid  function tests. Consider follow-up ultrasound in 6-12 months.  Labs:      Component Value Date/Time   NA 139 09/10/2023 1502   K 3.6 09/10/2023 1502   CL 108 09/10/2023 1502   CO2 22 09/10/2023 1502   GLUCOSE 81 09/10/2023 1502   BUN 14 09/10/2023 1502   CREATININE 0.84 09/10/2023 1502   CREATININE 0.62 09/03/2023 1008   CALCIUM  9.6 09/10/2023 1502   PROT 7.9 09/03/2023 1008   ALBUMIN 4.0 11/09/2020 0647   AST 17 09/03/2023 1008   ALT 13 09/03/2023 1008   ALKPHOS 45 11/09/2020 0647   BILITOT 0.5 09/03/2023 1008   GFRNONAA >60 09/10/2023 1502   GFRNONAA 75 02/21/2021 0931   GFRAA 87 02/21/2021 0931   Lab Results  Component Value Date   WBC 6.8 09/10/2023   HGB 12.6 09/10/2023   HCT 36.1 09/10/2023   MCV 88.0 09/10/2023   PLT 220 09/10/2023   Lab Results  Component Value Date   CHOL 216 (H) 09/03/2023   HDL 44 (L) 09/03/2023   LDLCALC 144 (H) 09/03/2023   TRIG 146 09/03/2023   CHOLHDL 4.9 09/03/2023   Lab Results  Component Value Date   HGBA1C 5.0 04/13/2022    Lab Results  Component Value Date   TSH 0.76 09/03/2023    Assessment & Plan:  Sore Throat w/o Clear Etiology: Doesn't appear to have acute otitis media and I don't feel any significant adenopathy. Ordering CBC w/Differential, Sedimentation Rate, T4, and TSH. She received 80 mg Depo-Medrol  today. Sending in 250 mg Azithromycin  - take 2 tablets on day 1 and then 1 tablet on days 2-5, Hycodan syrup - take 1 teaspoon every 8 hours as needed for sore throat, and 20 mg Prilosec for any possible reflux.  Addendum:  Ordering thyroid  ultrasound.Sed rate is 53, free T4 is 1.00 and TSH is low at 0.12. Concern for possible acute thyroiditis.  I,Emily Lagle,acting as a neurosurgeon for [name] JINNY Hailstone, MD.,have documented all relevant documentation on the behalf of [name] JINNY Hailstone, MD,as directed by  [name] JINNY Hailstone, MD while in the presence of [name] JINNY Hailstone, MD.   I, [name] JINNY Hailstone, MD, have reviewed all documentation for this visit. The documentation on 11/08/23 for the exam, diagnosis, procedures, and orders are all accurate and complete.

## 2023-11-04 ENCOUNTER — Ambulatory Visit: Payer: No Typology Code available for payment source | Admitting: Internal Medicine

## 2023-11-04 ENCOUNTER — Encounter: Payer: Self-pay | Admitting: Internal Medicine

## 2023-11-04 VITALS — BP 130/88 | HR 57 | Temp 98.0°F | Ht 61.0 in | Wt 127.0 lb

## 2023-11-04 DIAGNOSIS — J029 Acute pharyngitis, unspecified: Secondary | ICD-10-CM

## 2023-11-04 MED ORDER — OMEPRAZOLE 20 MG PO CPDR: 20.0000 mg | DELAYED_RELEASE_CAPSULE | Freq: Every day | ORAL | 3 refills | Status: DC

## 2023-11-04 MED ORDER — METHYLPREDNISOLONE ACETATE 80 MG/ML IJ SUSP
80.0000 mg | Freq: Once | INTRAMUSCULAR | Status: AC
  Administered 2023-11-04: 80 mg via INTRAMUSCULAR

## 2023-11-04 MED ORDER — AZITHROMYCIN 250 MG PO TABS
ORAL_TABLET | ORAL | 0 refills | Status: AC
Start: 2023-11-04 — End: 2023-11-09

## 2023-11-04 MED ORDER — HYDROCODONE BIT-HOMATROP MBR 5-1.5 MG/5ML PO SOLN: 5.0000 mL | Freq: Three times a day (TID) | ORAL | 0 refills | Status: DC | PRN

## 2023-11-05 ENCOUNTER — Ambulatory Visit
Admission: RE | Admit: 2023-11-05 | Discharge: 2023-11-05 | Disposition: A | Discharge status: Home / Self Care | Payer: No Typology Code available for payment source | Source: Ambulatory Visit | Attending: Internal Medicine | Admitting: Internal Medicine

## 2023-11-05 ENCOUNTER — Encounter: Payer: Self-pay | Admitting: Internal Medicine

## 2023-11-05 ENCOUNTER — Telehealth: Payer: Self-pay | Admitting: Internal Medicine

## 2023-11-05 DIAGNOSIS — E0789 Other specified disorders of thyroid: Secondary | ICD-10-CM

## 2023-11-05 LAB — T4, FREE: Free T4: 1 ng/dL (ref 0.8–1.8)

## 2023-11-05 LAB — CBC WITH DIFFERENTIAL/PLATELET
Absolute Lymphocytes: 1452 {cells}/uL (ref 850–3900)
Absolute Monocytes: 583 {cells}/uL (ref 200–950)
Basophils Absolute: 39 {cells}/uL (ref 0–200)
Basophils Relative: 0.7 %
Eosinophils Absolute: 83 {cells}/uL (ref 15–500)
Eosinophils Relative: 1.5 %
HCT: 38.2 % (ref 35.0–45.0)
Hemoglobin: 12.5 g/dL (ref 11.7–15.5)
MCH: 29.2 pg (ref 27.0–33.0)
MCHC: 32.7 g/dL (ref 32.0–36.0)
MCV: 89.3 fL (ref 80.0–100.0)
MPV: 10.7 fL (ref 7.5–12.5)
Monocytes Relative: 10.6 %
Neutro Abs: 3344 {cells}/uL (ref 1500–7800)
Neutrophils Relative %: 60.8 %
Platelets: 302 10*3/uL (ref 140–400)
RBC: 4.28 10*6/uL (ref 3.80–5.10)
RDW: 13 % (ref 11.0–15.0)
Total Lymphocyte: 26.4 %
WBC: 5.5 10*3/uL (ref 3.8–10.8)

## 2023-11-05 LAB — TSH: TSH: 0.12 m[IU]/L — ABNORMAL LOW (ref 0.40–4.50)

## 2023-11-05 LAB — SEDIMENTATION RATE: Sed Rate: 53 mm/h — ABNORMAL HIGH (ref 0–30)

## 2023-11-05 NOTE — Telephone Encounter (Signed)
 Patient has tender thyroid  on exam and TSH is low. Free T4 normal. Advised patient we would be ordering thyroid  ultrasound. May need Endicrine evaluation.  Thyroid  ultrasound shows findings consistent with Thyroiditis. Sed rate is 53. Free T4 is 1.00 and TSH is low at 0.12.  Patient will follow up here early next week on Tuesday. Probably should stop Topamax  for a few days but that may cause migraines to worsen.  MJB, MD

## 2023-11-08 NOTE — Patient Instructions (Signed)
 Prescribed Zithromax Z pak. May take Hycodan for sore throat. May take Prilosec for possible reflux.sed rate and TFTs ordered.

## 2023-11-08 NOTE — Progress Notes (Signed)
 Patient Care Team: Perri [name] PARAS, [name] as PCP - General  Visit Date: 11/09/23  Subjective:   Chief Complaint  Patient presents with   Results    Thyroid  ultrasound results    Patient PI:[name] [name] DOB:05/22/1961,62 y.o.MRN:[id number]   [name] presents today for follow-up for OV on 11/04/23. TSH 0.12. Free T4 1.0. Sed Rate 53. CBC WNL. Her Thyroid  US  on 11/05/23 indicated mildly enlarged and diffusely heterogeneous thyroid  gland. Multiple areas of nodularity were measured on the exam but these are not discrete nodules by imaging and blend into one another. It is felt that these are all likely areas of pseudo nodularity within a heterogeneous gland. The appearance of the thyroid  gland is likely consistent with diffuse goiter with probable areas of focal thyroiditis. Correlation suggested with thyroid  function tests. Discussed findings with possibility of thyroiditis. Endorses severe fatigue, difficulty staying asleep, and decreased appetite.    Past Medical History:  Diagnosis Date   Anxiety    Arthritis    Helicobacter pylori (H. pylori) 07/2010   Migraine     Family History  Adopted: Yes  Problem Relation Age of Onset   Hypertension Mother    Mental illness Mother    Hypertension Brother    Social History   Social History Narrative   Pt went into the foster system at the age of 43. She as in 4 really bad foster homes. She was raised in [place]. Pt has one brother. Pt graduated HS. Pt lives in [place] and has one son and 2 daughters. Divorced x3. 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    Review of Systems  Constitutional:  Positive for malaise/fatigue.  HENT: Negative.    Eyes: Negative.   Respiratory: Negative.    Cardiovascular: Negative.   Gastrointestinal:        (+) Decreased Appetite  Genitourinary: Negative.   Musculoskeletal: Negative.   Skin: Negative.   Neurological: Negative.   Endo/Heme/Allergies: Negative.    Psychiatric/Behavioral:  The patient has insomnia (difficulty staying asleep).      Objective:  Vitals: BP 110/80   Pulse 74   Ht 5' 1 (1.549 m)   Wt 129 lb (58.5 kg)   LMP 11/02/2000   SpO2 97%   BMI 24.37 kg/m  Physical Exam Vitals and nursing note reviewed.  Constitutional:      General: She is not in acute distress.    Appearance: Normal appearance. She is not toxic-appearing.  HENT:     Head: Normocephalic and atraumatic.  Neck:     Thyroid : Thyromegaly (slight) present.  Pulmonary:     Effort: Pulmonary effort is normal.  Lymphadenopathy:     Cervical: No cervical adenopathy.  Skin:    General: Skin is warm and dry.  Neurological:     Mental Status: She is alert and oriented to person, place, and time. Mental status is at baseline.  Psychiatric:        Mood and Affect: Mood normal.        Behavior: Behavior normal.        Thought Content: Thought content normal.        Judgment: Judgment normal.     Results:  Studies obtained and personally reviewed by me:  Thyroid  US  on 11/05/23: Mildly enlarged and diffusely heterogeneous thyroid  gland. Multiple areas of nodularity were measured on the exam but these are not discrete nodules by imaging and blend into one another. It is felt that  these are all likely areas of pseudo nodularity within a heterogeneous gland. The appearance of the thyroid  gland is likely consistent with diffuse goiter with probable areas of focal thyroiditis. Correlation suggested with thyroid  function tests.  Labs:     Component Value Date/Time   NA 139 09/10/2023 1502   K 3.6 09/10/2023 1502   CL 108 09/10/2023 1502   CO2 22 09/10/2023 1502   GLUCOSE 81 09/10/2023 1502   BUN 14 09/10/2023 1502   CREATININE 0.84 09/10/2023 1502   CREATININE 0.62 09/03/2023 1008   CALCIUM  9.6 09/10/2023 1502   PROT 7.9 09/03/2023 1008   ALBUMIN 4.0 11/09/2020 0647   AST 17 09/03/2023 1008   ALT 13 09/03/2023 1008   ALKPHOS 45 11/09/2020 0647   BILITOT  0.5 09/03/2023 1008   GFRNONAA >60 09/10/2023 1502   GFRNONAA 75 02/21/2021 0931   GFRAA 87 02/21/2021 0931    Lab Results  Component Value Date   WBC 5.5 11/04/2023   HGB 12.5 11/04/2023   HCT 38.2 11/04/2023   MCV 89.3 11/04/2023   PLT 302 11/04/2023   Lab Results  Component Value Date   CHOL 216 (H) 09/03/2023   HDL 44 (L) 09/03/2023   LDLCALC 144 (H) 09/03/2023   TRIG 146 09/03/2023   CHOLHDL 4.9 09/03/2023   Lab Results  Component Value Date   HGBA1C 5.0 04/13/2022    Lab Results  Component Value Date   TSH 0.12 (L) 11/04/2023   Assessment & Plan:   Thyromegaly: TSH 0.12. Free T4 1.0. Thyroid  US  appearance of the thyroid  gland is likely consistent with diffuse goiter with probable areas of focal thyroiditis. Correlation suggested with thyroid  function tests. Possible thyroiditis. Sending in referral for endocrinology.     I,Emily Lagle,acting as a neurosurgeon for [name] [name].,have documented all relevant documentation on the behalf of [name] [name],as directed by  [name] [name] while in the presence of [name] [name].   I, [name] [name], have reviewed all documentation for this visit. The documentation on 11/14/23 for the exam, diagnosis, procedures, and orders are all accurate and complete.

## 2023-11-09 ENCOUNTER — Ambulatory Visit (INDEPENDENT_AMBULATORY_CARE_PROVIDER_SITE_OTHER): Payer: No Typology Code available for payment source | Admitting: Internal Medicine

## 2023-11-09 ENCOUNTER — Encounter: Payer: Self-pay | Admitting: Internal Medicine

## 2023-11-09 VITALS — BP 110/80 | HR 74 | Ht 61.0 in | Wt 129.0 lb

## 2023-11-09 DIAGNOSIS — F419 Anxiety disorder, unspecified: Secondary | ICD-10-CM

## 2023-11-09 DIAGNOSIS — E069 Thyroiditis, unspecified: Secondary | ICD-10-CM

## 2023-11-09 DIAGNOSIS — Z8669 Personal history of other diseases of the nervous system and sense organs: Secondary | ICD-10-CM

## 2023-11-09 DIAGNOSIS — Z8659 Personal history of other mental and behavioral disorders: Secondary | ICD-10-CM

## 2023-11-09 DIAGNOSIS — E0789 Other specified disorders of thyroid: Secondary | ICD-10-CM

## 2023-11-09 DIAGNOSIS — R7989 Other specified abnormal findings of blood chemistry: Secondary | ICD-10-CM

## 2023-11-09 DIAGNOSIS — F32A Depression, unspecified: Secondary | ICD-10-CM

## 2023-11-09 DIAGNOSIS — E7800 Pure hypercholesterolemia, unspecified: Secondary | ICD-10-CM

## 2023-11-09 NOTE — Patient Instructions (Signed)
 We have reviewed ultrasound results. Patient still feeling jittery. Needs further evaluation by Endocrinologist. Declines offer for beta blocker or anti-anxiety medication.

## 2023-11-14 ENCOUNTER — Encounter: Payer: Self-pay | Admitting: Internal Medicine

## 2023-12-23 ENCOUNTER — Other Ambulatory Visit: Payer: No Typology Code available for payment source

## 2023-12-28 ENCOUNTER — Other Ambulatory Visit: Payer: No Typology Code available for payment source

## 2023-12-28 DIAGNOSIS — Z0000 Encounter for general adult medical examination without abnormal findings: Secondary | ICD-10-CM

## 2023-12-28 DIAGNOSIS — E069 Thyroiditis, unspecified: Secondary | ICD-10-CM

## 2023-12-28 DIAGNOSIS — F419 Anxiety disorder, unspecified: Secondary | ICD-10-CM

## 2023-12-28 DIAGNOSIS — E7800 Pure hypercholesterolemia, unspecified: Secondary | ICD-10-CM

## 2023-12-28 DIAGNOSIS — R7989 Other specified abnormal findings of blood chemistry: Secondary | ICD-10-CM

## 2023-12-29 ENCOUNTER — Ambulatory Visit: Payer: No Typology Code available for payment source | Admitting: "Endocrinology

## 2023-12-29 ENCOUNTER — Other Ambulatory Visit: Payer: Self-pay

## 2023-12-29 DIAGNOSIS — E039 Hypothyroidism, unspecified: Secondary | ICD-10-CM

## 2023-12-30 ENCOUNTER — Ambulatory Visit: Payer: No Typology Code available for payment source | Admitting: Internal Medicine

## 2023-12-30 ENCOUNTER — Encounter: Payer: Self-pay | Admitting: Internal Medicine

## 2023-12-30 VITALS — BP 120/80 | HR 61 | Ht 61.0 in | Wt 126.0 lb

## 2023-12-30 DIAGNOSIS — F32A Depression, unspecified: Secondary | ICD-10-CM

## 2023-12-30 DIAGNOSIS — Z0000 Encounter for general adult medical examination without abnormal findings: Secondary | ICD-10-CM | POA: Diagnosis not present

## 2023-12-30 DIAGNOSIS — Z8669 Personal history of other diseases of the nervous system and sense organs: Secondary | ICD-10-CM | POA: Diagnosis not present

## 2023-12-30 DIAGNOSIS — Z8659 Personal history of other mental and behavioral disorders: Secondary | ICD-10-CM

## 2023-12-30 DIAGNOSIS — E069 Thyroiditis, unspecified: Secondary | ICD-10-CM

## 2023-12-30 DIAGNOSIS — Z1231 Encounter for screening mammogram for malignant neoplasm of breast: Secondary | ICD-10-CM

## 2023-12-30 DIAGNOSIS — I10 Essential (primary) hypertension: Secondary | ICD-10-CM

## 2023-12-30 DIAGNOSIS — F419 Anxiety disorder, unspecified: Secondary | ICD-10-CM | POA: Diagnosis not present

## 2023-12-30 DIAGNOSIS — E7800 Pure hypercholesterolemia, unspecified: Secondary | ICD-10-CM

## 2023-12-30 DIAGNOSIS — Z79890 Hormone replacement therapy (postmenopausal): Secondary | ICD-10-CM

## 2023-12-30 DIAGNOSIS — I728 Aneurysm of other specified arteries: Secondary | ICD-10-CM

## 2023-12-30 LAB — LIPID PANEL
Cholesterol: 175 mg/dL (ref <200)
HDL: 55 mg/dL (ref >=50)
LDL Cholesterol (Calc): 101 mg/dL — ABNORMAL HIGH
Non-HDL Cholesterol (Calc): 120 mg/dL (ref <130)
Total CHOL/HDL Ratio: 3.2 (calc) (ref <5.0)
Triglycerides: 101 mg/dL (ref <150)

## 2023-12-30 LAB — CBC WITH DIFFERENTIAL/PLATELET
Absolute Lymphocytes: 1814 {cells}/uL (ref 850–3900)
Absolute Monocytes: 426 {cells}/uL (ref 200–950)
Basophils Absolute: 62 {cells}/uL (ref 0–200)
Basophils Relative: 1.1 %
Eosinophils Absolute: 90 {cells}/uL (ref 15–500)
Eosinophils Relative: 1.6 %
HCT: 44.1 % (ref 35.0–45.0)
Hemoglobin: 14.2 g/dL (ref 11.7–15.5)
MCH: 29.9 pg (ref 27.0–33.0)
MCHC: 32.2 g/dL (ref 32.0–36.0)
MCV: 92.8 fL (ref 80.0–100.0)
MPV: 10.9 fL (ref 7.5–12.5)
Monocytes Relative: 7.6 %
Neutro Abs: 3209 {cells}/uL (ref 1500–7800)
Neutrophils Relative %: 57.3 %
Platelets: 213 10*3/uL (ref 140–400)
RBC: 4.75 10*6/uL (ref 3.80–5.10)
RDW: 14.1 % (ref 11.0–15.0)
Total Lymphocyte: 32.4 %
WBC: 5.6 10*3/uL (ref 3.8–10.8)

## 2023-12-30 LAB — POCT URINALYSIS DIP (CLINITEK)
Bilirubin, UA: NEGATIVE
Blood, UA: NEGATIVE
Glucose, UA: NEGATIVE mg/dL
Ketones, POC UA: NEGATIVE mg/dL
Leukocytes, UA: NEGATIVE
Nitrite, UA: NEGATIVE
POC PROTEIN,UA: NEGATIVE
Spec Grav, UA: 1.015 (ref 1.010–1.025)
Urobilinogen, UA: 0.2 U/dL
pH, UA: 7 (ref 5.0–8.0)

## 2023-12-30 LAB — T4, FREE: Free T4: 0.9 ng/dL (ref 0.8–1.8)

## 2023-12-30 LAB — COMPLETE METABOLIC PANEL WITH GFR
AG Ratio: 1.1 (calc) (ref 1.0–2.5)
ALT: 10 U/L (ref 6–29)
AST: 16 U/L (ref 10–35)
Albumin: 4.1 g/dL (ref 3.6–5.1)
Alkaline phosphatase (APISO): 56 U/L (ref 37–153)
BUN: 16 mg/dL (ref 7–25)
CO2: 23 mmol/L (ref 20–32)
Calcium: 9.6 mg/dL (ref 8.6–10.4)
Chloride: 113 mmol/L — ABNORMAL HIGH (ref 98–110)
Creat: 0.91 mg/dL (ref 0.50–1.05)
Globulin: 3.6 g/dL (ref 1.9–3.7)
Glucose, Bld: 82 mg/dL (ref 65–99)
Potassium: 5.3 mmol/L (ref 3.5–5.3)
Sodium: 142 mmol/L (ref 135–146)
Total Bilirubin: 0.4 mg/dL (ref 0.2–1.2)
Total Protein: 7.7 g/dL (ref 6.1–8.1)
eGFR: 72 mL/min/{1.73_m2} (ref >=60)

## 2023-12-30 LAB — TEST AUTHORIZATION

## 2023-12-30 LAB — TSH: TSH: 8.28 m[IU]/L — ABNORMAL HIGH (ref 0.40–4.50)

## 2023-12-30 MED ORDER — DULOXETINE HCL 30 MG PO CPEP: 30.0000 mg | ORAL_CAPSULE | Freq: Every day | ORAL | 1 refills | Status: DC

## 2023-12-30 MED ORDER — AMLODIPINE BESYLATE 5 MG PO TABS: 5.0000 mg | ORAL_TABLET | Freq: Every day | ORAL | 2 refills | Status: DC

## 2023-12-30 MED ORDER — ALPRAZOLAM 0.25 MG PO TABS: 0.2500 mg | ORAL_TABLET | Freq: Two times a day (BID) | ORAL | 1 refills | Status: AC | PRN

## 2023-12-30 MED ORDER — LEVOTHYROXINE SODIUM 75 MCG PO TABS: 75.0000 ug | ORAL_TABLET | Freq: Every day | ORAL | 0 refills | Status: DC
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
## 2023-12-31 NOTE — Patient Instructions (Addendum)
 Letter written to excuse your from [facility] contract at your request. Please keep appt with Endocrinologist in the near future 02/03/2024 at [facility]. Continue Mobic for musculoskeletal pain. Continue topomax for migraine prevention. Take rosuvastatin for mixed hyperlipidemia. Continue Cymbalta and Xanax.Continue estrogen replacement. RTC in one year or as needed.

## 2024-01-04 ENCOUNTER — Other Ambulatory Visit: Payer: Self-pay | Admitting: Internal Medicine

## 2024-01-11 ENCOUNTER — Encounter: Payer: Self-pay | Admitting: Family Medicine

## 2024-01-25 ENCOUNTER — Other Ambulatory Visit: Payer: Self-pay

## 2024-01-25 ENCOUNTER — Other Ambulatory Visit: Payer: Self-pay | Admitting: "Endocrinology

## 2024-01-25 DIAGNOSIS — R946 Abnormal results of thyroid function studies: Secondary | ICD-10-CM

## 2024-02-01 ENCOUNTER — Other Ambulatory Visit: Payer: Self-pay | Admitting: Internal Medicine

## 2024-02-01 ENCOUNTER — Other Ambulatory Visit: Payer: No Typology Code available for payment source

## 2024-02-03 ENCOUNTER — Encounter: Payer: Self-pay | Admitting: "Endocrinology

## 2024-02-03 ENCOUNTER — Ambulatory Visit: Payer: No Typology Code available for payment source | Admitting: "Endocrinology

## 2024-02-03 VITALS — BP 130/86 | HR 90 | Ht 61.0 in | Wt 124.0 lb

## 2024-02-03 DIAGNOSIS — E039 Hypothyroidism, unspecified: Secondary | ICD-10-CM | POA: Diagnosis not present
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
## 2024-02-04 LAB — T4, FREE: Free T4: 1.1 ng/dL (ref 0.8–1.8)

## 2024-02-04 LAB — TSH: TSH: 1.13 m[IU]/L (ref 0.40–4.50)

## 2024-02-08 ENCOUNTER — Encounter: Payer: Self-pay | Admitting: "Endocrinology

## 2024-03-05 ENCOUNTER — Other Ambulatory Visit: Payer: Self-pay | Admitting: Internal Medicine

## 2024-03-06 ENCOUNTER — Other Ambulatory Visit: Payer: Self-pay

## 2024-03-06 ENCOUNTER — Other Ambulatory Visit: Payer: Self-pay | Admitting: Internal Medicine

## 2024-03-06 MED ORDER — ROSUVASTATIN CALCIUM 5 MG PO TABS: 5.0000 mg | ORAL_TABLET | Freq: Every day | ORAL | 1 refills | Status: DC

## 2024-03-06 MED ORDER — ESTRADIOL 0.1 MG/24HR TD PTTW: MEDICATED_PATCH | TRANSDERMAL | 0 refills | Status: DC

## 2024-03-06 NOTE — Telephone Encounter (Signed)
 Copied from CRM [id number]. Topic: Clinical - Medication Refill >> Mar 06, 2024 10:58 AM Felicitas Horse wrote: Most Recent Primary Care Visit:  Provider: Sylvan Evener  Department: [facility]  Visit Type: PHYSICAL  Date: 12/30/2023  Medication:  rosuvastatin  (CRESTOR ) 5 MG tablet estradiol  (VIVELLE -DOT) 0.1 MG/24HR patch  Has the patient contacted their pharmacy? Yes (Agent: If no, request that the patient contact the pharmacy for the refill. If patient does not wish to contact the pharmacy document the reason why and proceed with request.) (Agent: If yes, when and what did the pharmacy advise?)  Is this the correct pharmacy for this prescription? Yes If no, delete pharmacy and type the correct one.  This is the patient's preferred pharmacy:  [phone] [place], [place] - [address]  [place] [address] Phone: [phone] Fax: [phone]  Has the prescription been filled recently? Yes, 01/04/2024  Is the patient out of the medication? Yes, pt has no medication left   Has the patient been seen for an appointment in the last year OR does the patient have an upcoming appointment? Yes  Can we respond through MyChart? Yes  Agent: Please be advised that Rx refills may take up to 3 business days. We ask that you follow-up with your pharmacy.

## 2024-03-08 ENCOUNTER — Other Ambulatory Visit: Payer: Self-pay | Admitting: Internal Medicine

## 2024-03-08 ENCOUNTER — Ambulatory Visit: Payer: Self-pay | Admitting: Internal Medicine

## 2024-03-08 NOTE — Telephone Encounter (Signed)
 Copied from CRM [id number]. Topic: Clinical - Prescription Issue >> Mar 08, 2024  4:59 PM Stanly Early wrote: Reason for CRM: patient called for the rx refill and I informed her It takes up to 3 business and to follow up with her pharmacy today and tomorrow

## 2024-03-09 MED ORDER — LEVOTHYROXINE SODIUM 75 MCG PO TABS: 75.0000 ug | ORAL_TABLET | Freq: Every day | ORAL | 0 refills | Status: DC

## 2024-03-09 MED ORDER — ESTRADIOL 0.1 MG/24HR TD PTTW: MEDICATED_PATCH | TRANSDERMAL | 0 refills | Status: DC

## 2024-03-30 ENCOUNTER — Ambulatory Visit (INDEPENDENT_AMBULATORY_CARE_PROVIDER_SITE_OTHER): Admitting: Internal Medicine

## 2024-03-30 ENCOUNTER — Encounter: Payer: Self-pay | Admitting: Internal Medicine

## 2024-03-30 VITALS — BP 120/88 | HR 74 | Ht 61.0 in | Wt 124.0 lb

## 2024-03-30 DIAGNOSIS — F32A Depression, unspecified: Secondary | ICD-10-CM

## 2024-03-30 DIAGNOSIS — F419 Anxiety disorder, unspecified: Secondary | ICD-10-CM

## 2024-03-30 DIAGNOSIS — Z8669 Personal history of other diseases of the nervous system and sense organs: Secondary | ICD-10-CM | POA: Diagnosis not present

## 2024-03-30 DIAGNOSIS — Z8639 Personal history of other endocrine, nutritional and metabolic disease: Secondary | ICD-10-CM

## 2024-03-30 DIAGNOSIS — I10 Essential (primary) hypertension: Secondary | ICD-10-CM

## 2024-03-30 DIAGNOSIS — Z8659 Personal history of other mental and behavioral disorders: Secondary | ICD-10-CM

## 2024-03-30 DIAGNOSIS — E039 Hypothyroidism, unspecified: Secondary | ICD-10-CM

## 2024-03-30 DIAGNOSIS — E7800 Pure hypercholesterolemia, unspecified: Secondary | ICD-10-CM

## 2024-03-30 MED ORDER — TOPIRAMATE 200 MG PO TABS: 200.0000 mg | ORAL_TABLET | Freq: Two times a day (BID) | ORAL | 1 refills | Status: DC
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
## 2024-05-29 ENCOUNTER — Other Ambulatory Visit: Payer: Self-pay | Admitting: Internal Medicine

## 2024-08-01 ENCOUNTER — Other Ambulatory Visit: Payer: Self-pay

## 2024-08-01 ENCOUNTER — Encounter: Payer: Self-pay | Admitting: Internal Medicine

## 2024-08-01 ENCOUNTER — Emergency Department (HOSPITAL_COMMUNITY)

## 2024-08-01 ENCOUNTER — Ambulatory Visit: Admitting: Internal Medicine

## 2024-08-01 ENCOUNTER — Encounter (HOSPITAL_COMMUNITY): Payer: Self-pay

## 2024-08-01 ENCOUNTER — Emergency Department (HOSPITAL_COMMUNITY): Admission: EM | Admit: 2024-08-01 | Discharge: 2024-08-02 | Discharge status: Against Medical Advice

## 2024-08-01 VITALS — BP 130/80 | HR 44 | Temp 97.7°F | Ht 61.0 in | Wt 125.0 lb

## 2024-08-01 DIAGNOSIS — Z5321 Procedure and treatment not carried out due to patient leaving prior to being seen by health care provider: Secondary | ICD-10-CM | POA: Insufficient documentation

## 2024-08-01 DIAGNOSIS — R0602 Shortness of breath: Secondary | ICD-10-CM | POA: Insufficient documentation

## 2024-08-01 DIAGNOSIS — Z8659 Personal history of other mental and behavioral disorders: Secondary | ICD-10-CM

## 2024-08-01 DIAGNOSIS — R0789 Other chest pain: Secondary | ICD-10-CM | POA: Diagnosis present

## 2024-08-01 DIAGNOSIS — E033 Postinfectious hypothyroidism: Secondary | ICD-10-CM

## 2024-08-01 DIAGNOSIS — R079 Chest pain, unspecified: Secondary | ICD-10-CM | POA: Diagnosis not present

## 2024-08-01 DIAGNOSIS — I728 Aneurysm of other specified arteries: Secondary | ICD-10-CM

## 2024-08-01 DIAGNOSIS — R42 Dizziness and giddiness: Secondary | ICD-10-CM | POA: Insufficient documentation

## 2024-08-01 DIAGNOSIS — I10 Essential (primary) hypertension: Secondary | ICD-10-CM

## 2024-08-01 DIAGNOSIS — R001 Bradycardia, unspecified: Secondary | ICD-10-CM | POA: Diagnosis not present

## 2024-08-01 DIAGNOSIS — E7800 Pure hypercholesterolemia, unspecified: Secondary | ICD-10-CM

## 2024-08-01 DIAGNOSIS — Z8639 Personal history of other endocrine, nutritional and metabolic disease: Secondary | ICD-10-CM

## 2024-08-01 DIAGNOSIS — F419 Anxiety disorder, unspecified: Secondary | ICD-10-CM

## 2024-08-01 DIAGNOSIS — Z8669 Personal history of other diseases of the nervous system and sense organs: Secondary | ICD-10-CM

## 2024-08-01 LAB — CBC
HCT: 39.6 % (ref 36.0–46.0)
Hemoglobin: 13.2 g/dL (ref 12.0–15.0)
MCH: 31.6 pg (ref 26.0–34.0)
MCHC: 33.3 g/dL (ref 30.0–36.0)
MCV: 94.7 fL (ref 80.0–100.0)
Platelets: 169 K/uL (ref 150–400)
RBC: 4.18 MIL/uL (ref 3.87–5.11)
RDW: 13.2 % (ref 11.5–15.5)
WBC: 5.4 K/uL (ref 4.0–10.5)
nRBC: 0 % (ref 0.0–0.2)

## 2024-08-01 LAB — COMPREHENSIVE METABOLIC PANEL WITH GFR
ALT: 12 U/L (ref 0–44)
AST: 22 U/L (ref 15–41)
Albumin: 3.8 g/dL (ref 3.5–5.0)
Alkaline Phosphatase: 34 U/L — ABNORMAL LOW (ref 38–126)
Anion gap: 11 (ref 5–15)
BUN: 11 mg/dL (ref 8–23)
CO2: 17 mmol/L — ABNORMAL LOW (ref 22–32)
Calcium: 8.9 mg/dL (ref 8.9–10.3)
Chloride: 110 mmol/L (ref 98–111)
Creatinine, Ser: 0.67 mg/dL (ref 0.44–1.00)
GFR, Estimated: >60 mL/min (ref >60)
Glucose, Bld: 81 mg/dL (ref 70–99)
Potassium: 3.8 mmol/L (ref 3.5–5.1)
Sodium: 138 mmol/L (ref 135–145)
Total Bilirubin: 0.7 mg/dL (ref 0.0–1.2)
Total Protein: 6.9 g/dL (ref 6.5–8.1)

## 2024-08-01 LAB — TROPONIN I (HIGH SENSITIVITY)
Troponin I (High Sensitivity): 3 ng/L (ref <18)
Troponin I (High Sensitivity): 4 ng/L (ref <18)

## 2024-08-01 LAB — POC COVID19/FLU A&B COMBO
Covid Antigen, POC: NEGATIVE
Influenza A Antigen, POC: NEGATIVE
Influenza B Antigen, POC: NEGATIVE

## 2024-08-01 LAB — LIPASE, BLOOD: Lipase: 38 U/L (ref 11–51)

## 2024-08-01 NOTE — ED Triage Notes (Signed)
 Patient states the she has had chest pain for the past week and a half and it is really bad today. Today it is a constant pain that feels like pressure, with SHOB, dizziness and nauseous. Pain is located in the central chest without radiation. Pt reports that she has had indigestion but that is unusual and tums did not relieve the pain.

## 2024-08-01 NOTE — ED Triage Notes (Signed)
 Pt c/o intermittent, central cp x 1.5 weeks; endorses sob and dizziness; denies cardiac hx; pain described as tightness, heaviness

## 2024-08-01 NOTE — ED Notes (Signed)
 Pt called x2 for vital reassessment.

## 2024-08-01 NOTE — ED Notes (Signed)
 Patient stated she has been waiting six hours, and refused to wait any longer.
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
## 2024-08-01 NOTE — ED Provider Triage Note (Signed)
 Emergency Medicine Provider Triage Evaluation Note  Danniela Mcbrearty , a 63 y.o. female  was evaluated in triage.  Pt complains of cp. Reoccuring midsternal cp ongoing for nearly a week, more pronounce today.  Endorse, headache, nausea, sob and dizziness.  Works a lot as a Programmer, applications.  No hx of alcohol or tobacco abuse.  No cardiac hx.  No hx of GERD  Review of Systems  Positive: As above Negative: As above  Physical Exam  BP (!) 162/91 (BP Location: Right Arm)   Pulse (!) 54   Temp 97.9 F (36.6 C)   Resp (!) 30   LMP 11/02/2000   SpO2 100%  Gen:   Awake, no distress   Resp:  Normal effort  MSK:   Moves extremities without difficulty  Other:    Medical Decision Making  Medically screening exam initiated at 5:10 PM.  Appropriate orders placed.  Vernell DELENA Oneita Joshua was informed that the remainder of the evaluation will be completed by another provider, this initial triage assessment does not replace that evaluation, and the importance of remaining in the ED until their evaluation is complete.     Nivia Colon, PA-C 08/01/24 1712

## 2024-08-01 NOTE — Patient Instructions (Signed)
 Patient is being transferred to ED at [facility] for urgent evaluation by her daughter in private vehicle

## 2024-08-02 ENCOUNTER — Telehealth: Payer: Self-pay | Admitting: Internal Medicine

## 2024-08-02 ENCOUNTER — Encounter: Payer: Self-pay | Admitting: Internal Medicine

## 2024-08-02 NOTE — Telephone Encounter (Signed)
 Called to check on patient. Says she feels exhausted. Heart rate 63 on her phone app she says. No fever.  Says she is tired from long wait in ED last evening. She left without being seen. I went over her lab, EKG, and CXR results from last evening. Had sinus bradycardia and incomplete RBBB. CXR was negative even though I heard some coarse rales bilaterally yesterday. She tested negative for Covid yesterday. No anemia and WBC was 5400.Electrolytes and liver functions were normal. Free t4 and TSH not checked with ED visit but ere last checked in April 2025.  Patient to monitor symptoms, stay hydrated and rest at home for a couple of days. Call  us  if symptoms worsen or develops new symptoms or has concerns. MJB, MD

## 2024-08-14 ENCOUNTER — Other Ambulatory Visit: Payer: Self-pay | Admitting: Internal Medicine

## 2024-09-04 ENCOUNTER — Other Ambulatory Visit: Payer: Self-pay | Admitting: Internal Medicine

## 2024-09-21 ENCOUNTER — Other Ambulatory Visit: Payer: Self-pay | Admitting: Internal Medicine

## 2024-09-27 ENCOUNTER — Other Ambulatory Visit: Payer: Self-pay | Admitting: Internal Medicine

## 2024-10-02 ENCOUNTER — Telehealth: Payer: Self-pay | Admitting: Internal Medicine

## 2024-10-02 NOTE — Telephone Encounter (Unsigned)
 Copied from CRM #[id number]. Topic: Clinical - Medication Refill >> Oct 02, 2024 11:08 AM Zy'onna H wrote: Medication:  topiramate  (TOPAMAX ) 200 MG tablet     Has the patient contacted their pharmacy? Yes (Agent: If no, request that the patient contact the pharmacy for the refill. If patient does not wish to contact the pharmacy document the reason why and proceed with request.) (Agent: If yes, when and what did the pharmacy advise?)  This is the patient's preferred pharmacy:  [id number] [place] [place] - [address] [place] [address] Phone: [phone] Fax: [phone]  Is this the correct pharmacy for this prescription? Yes If no, delete pharmacy and type the correct one.   Has the prescription been filled recently? No  Is the patient out of the medication? Yes  Has the patient been seen for an appointment in the last year OR does the patient have an upcoming appointment? Yes  Can we respond through MyChart? Yes  Agent: Please be advised that Rx refills may take up to 3 business days. We ask that you follow-up with your pharmacy.

## 2024-10-03 MED ORDER — TOPIRAMATE 200 MG PO TABS: 200.0000 mg | ORAL_TABLET | Freq: Two times a day (BID) | ORAL | 1 refills | Status: AC

## 2025-01-02 ENCOUNTER — Other Ambulatory Visit: Payer: Self-pay

## 2025-01-04 ENCOUNTER — Encounter: Payer: Self-pay | Admitting: Internal Medicine
